# Patient Record
Sex: Female | Born: 1960 | Race: White | Hispanic: No | Smoking: Current some day smoker
Health system: Southern US, Community
[De-identification: ages and names within clinical notes are randomized; demographics above are authoritative.]

## PROBLEM LIST (undated history)

## (undated) DIAGNOSIS — E785 Hyperlipidemia, unspecified: Secondary | ICD-10-CM

## (undated) DIAGNOSIS — M1990 Unspecified osteoarthritis, unspecified site: Secondary | ICD-10-CM

## (undated) DIAGNOSIS — N879 Dysplasia of cervix uteri, unspecified: Secondary | ICD-10-CM

## (undated) DIAGNOSIS — F419 Anxiety disorder, unspecified: Secondary | ICD-10-CM

## (undated) DIAGNOSIS — D414 Neoplasm of uncertain behavior of bladder: Secondary | ICD-10-CM

## (undated) DIAGNOSIS — J449 Chronic obstructive pulmonary disease, unspecified: Secondary | ICD-10-CM

## (undated) DIAGNOSIS — J45909 Unspecified asthma, uncomplicated: Secondary | ICD-10-CM

## (undated) HISTORY — DX: Anxiety disorder, unspecified: F41.9

## (undated) HISTORY — DX: Unspecified osteoarthritis, unspecified site: M19.90

## (undated) HISTORY — DX: Dysplasia of cervix uteri, unspecified: N87.9

## (undated) HISTORY — DX: Hyperlipidemia, unspecified: E78.5

## (undated) HISTORY — PX: ABDOMINAL HYSTERECTOMY: SHX81

## (undated) HISTORY — DX: Chronic obstructive pulmonary disease, unspecified: J44.9

## (undated) HISTORY — DX: Neoplasm of uncertain behavior of bladder: D41.4

## (undated) HISTORY — DX: Unspecified asthma, uncomplicated: J45.909

## (undated) HISTORY — PX: CHOLECYSTECTOMY: SHX55

## (undated) HISTORY — PX: TONSILLECTOMY: SHX5217

---

## 1996-09-16 HISTORY — PX: TOTAL COLECTOMY: SHX852

## 2003-03-22 ENCOUNTER — Ambulatory Visit (HOSPITAL_COMMUNITY): Admission: RE | Admit: 2003-03-22 | Discharge: 2003-03-22 | Payer: Self-pay | Admitting: Internal Medicine

## 2005-06-14 ENCOUNTER — Ambulatory Visit: Payer: Self-pay | Admitting: Cardiology

## 2010-01-29 ENCOUNTER — Emergency Department (HOSPITAL_COMMUNITY): Admission: EM | Admit: 2010-01-29 | Discharge: 2010-01-29 | Payer: Self-pay | Admitting: Emergency Medicine

## 2011-02-01 NOTE — Op Note (Signed)
NAME:  Tamara Castro, Tamara Castro                     ACCOUNT NO.:  [id number]   MEDICAL RECORD NO.:  [id number]                   PATIENT TYPE:  AMB   LOCATION:  DAY                                  FACILITY:  [facility]   PHYSICIAN:  Lionel December, M.D.                 DATE OF BIRTH:  1961/03/16   DATE OF PROCEDURE:  03/23/2003  DATE OF DISCHARGE:  03/22/2003                                 OPERATIVE REPORT   PROCEDURE:  Ambulatory esophageal pH report.   REFERRING PHYSICIAN:  Erskine Speed, M.D.   INDICATIONS:  Gastroesophageal reflux disease.   PRIOR STUDIES:  1. March 14, 2003 EGD by Dr. Erskine Speed revealed a sliding hiatal hernia.  2. October 1999 an esophageal manometry was abnormal. There was high distal     esophageal body amplitude of contraction with prolonged duration. There     was normal peristalsis. Resting lower esophageal sphincter pressure was     on the high end but adequately relaxed. 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   METHOD:  The pH electrodes were placed 20 and 5 cm above the proximal border  of the manometrically determined lower esophageal sphincter (LES). These  electrodes were defined as channels 1 and 2, respectively. The data was  converted using the [employer], version 2.30. A reflux  episode was defined as a drop in pH below 4.0. The procedure was reviewed  with the patient prior to performing it.   FINDINGS:  Proximal border of the LES (location from nares):  46 cm  (obtained from 1999 esophageal manometry)  Study duration:  24 hours  Channel 2 analysis:  Total time pH below 4.0 (min): Number of reflux episodes 13, 10 post  prandially. Number of reflux episodes longer than 5 minutes were 0. Longest  reflux episode 1 minute.  Fraction of total time pH below 4.0:  9 minutes, fraction time pH below 4,  0.6%.  DeMeester score (DeMeester normals <14.7 95th percentile): 2.7   IMPRESSION:  24 hour pH probe  study performed off of acid depression. The  patient discontinued Aciphex approximately 10 days prior to pH study. During  this 24 hour study, there was limited acid reflux episodes. Well within the  normal physiological range. The patient recorded no symptoms during this 24  hour time period. I did call the patient and she stated she had a remarkably  good day during the study, without her typical GERD symptoms.   She has a history of nut cracker's esophagus __________. Clinically is not  felt to have symptoms related to this. This will not be a contraindication  to antireflux surgery.   The above findings have been discussed with the patient.   RECOMMENDATION:  The patient is to followup with Dr. Erskine Speed.     Tana Coast, P.A.  Lionel December, M.D.    LL/MEDQ  D:  03/25/2003  T:  03/26/2003  Job:  [id number]   cc:   Erskine Speed, M.D.  [address] [place]  [place]  [place] [address]  Fax: [phone]

## 2015-01-09 DIAGNOSIS — M5430 Sciatica, unspecified side: Secondary | ICD-10-CM | POA: Diagnosis not present

## 2015-01-09 DIAGNOSIS — M545 Low back pain: Secondary | ICD-10-CM | POA: Diagnosis not present

## 2015-01-16 DIAGNOSIS — G2581 Restless legs syndrome: Secondary | ICD-10-CM | POA: Diagnosis not present

## 2015-01-16 DIAGNOSIS — F419 Anxiety disorder, unspecified: Secondary | ICD-10-CM | POA: Diagnosis not present

## 2015-01-16 DIAGNOSIS — Z720 Tobacco use: Secondary | ICD-10-CM | POA: Diagnosis not present

## 2015-01-25 DIAGNOSIS — M5442 Lumbago with sciatica, left side: Secondary | ICD-10-CM | POA: Diagnosis not present

## 2015-04-20 DIAGNOSIS — Z720 Tobacco use: Secondary | ICD-10-CM | POA: Diagnosis not present

## 2015-04-20 DIAGNOSIS — M5430 Sciatica, unspecified side: Secondary | ICD-10-CM | POA: Diagnosis not present

## 2015-04-20 DIAGNOSIS — F419 Anxiety disorder, unspecified: Secondary | ICD-10-CM | POA: Diagnosis not present

## 2015-04-20 DIAGNOSIS — M545 Low back pain: Secondary | ICD-10-CM | POA: Diagnosis not present

## 2015-04-20 DIAGNOSIS — Z1389 Encounter for screening for other disorder: Secondary | ICD-10-CM | POA: Diagnosis not present

## 2015-04-20 DIAGNOSIS — G2581 Restless legs syndrome: Secondary | ICD-10-CM | POA: Diagnosis not present

## 2015-04-25 DIAGNOSIS — Z1231 Encounter for screening mammogram for malignant neoplasm of breast: Secondary | ICD-10-CM | POA: Diagnosis not present

## 2015-09-28 DIAGNOSIS — Z0001 Encounter for general adult medical examination with abnormal findings: Secondary | ICD-10-CM | POA: Diagnosis not present

## 2015-09-28 DIAGNOSIS — G2581 Restless legs syndrome: Secondary | ICD-10-CM | POA: Diagnosis not present

## 2015-09-28 DIAGNOSIS — M545 Low back pain: Secondary | ICD-10-CM | POA: Diagnosis not present

## 2015-09-28 DIAGNOSIS — F0631 Mood disorder due to known physiological condition with depressive features: Secondary | ICD-10-CM | POA: Diagnosis not present

## 2015-09-28 DIAGNOSIS — F419 Anxiety disorder, unspecified: Secondary | ICD-10-CM | POA: Diagnosis not present

## 2015-09-28 DIAGNOSIS — Z23 Encounter for immunization: Secondary | ICD-10-CM | POA: Diagnosis not present

## 2015-09-28 DIAGNOSIS — Z720 Tobacco use: Secondary | ICD-10-CM | POA: Diagnosis not present

## 2016-03-13 DIAGNOSIS — M545 Low back pain: Secondary | ICD-10-CM | POA: Diagnosis not present

## 2016-03-13 DIAGNOSIS — M5432 Sciatica, left side: Secondary | ICD-10-CM | POA: Diagnosis not present

## 2016-04-04 DIAGNOSIS — G2581 Restless legs syndrome: Secondary | ICD-10-CM | POA: Diagnosis not present

## 2016-04-04 DIAGNOSIS — M545 Low back pain: Secondary | ICD-10-CM | POA: Diagnosis not present

## 2016-04-04 DIAGNOSIS — Z720 Tobacco use: Secondary | ICD-10-CM | POA: Diagnosis not present

## 2016-04-09 DIAGNOSIS — M5126 Other intervertebral disc displacement, lumbar region: Secondary | ICD-10-CM | POA: Diagnosis not present

## 2016-04-09 DIAGNOSIS — M5432 Sciatica, left side: Secondary | ICD-10-CM | POA: Diagnosis not present

## 2016-04-09 DIAGNOSIS — M5127 Other intervertebral disc displacement, lumbosacral region: Secondary | ICD-10-CM | POA: Diagnosis not present

## 2016-04-09 DIAGNOSIS — M4806 Spinal stenosis, lumbar region: Secondary | ICD-10-CM | POA: Diagnosis not present

## 2016-07-01 DIAGNOSIS — Z1231 Encounter for screening mammogram for malignant neoplasm of breast: Secondary | ICD-10-CM | POA: Diagnosis not present

## 2017-02-03 DIAGNOSIS — G4700 Insomnia, unspecified: Secondary | ICD-10-CM | POA: Diagnosis not present

## 2017-03-31 DIAGNOSIS — G2581 Restless legs syndrome: Secondary | ICD-10-CM | POA: Diagnosis not present

## 2017-03-31 DIAGNOSIS — Z1389 Encounter for screening for other disorder: Secondary | ICD-10-CM | POA: Diagnosis not present

## 2017-03-31 DIAGNOSIS — Z720 Tobacco use: Secondary | ICD-10-CM | POA: Diagnosis not present

## 2017-03-31 DIAGNOSIS — M5432 Sciatica, left side: Secondary | ICD-10-CM | POA: Diagnosis not present

## 2017-03-31 DIAGNOSIS — M545 Low back pain: Secondary | ICD-10-CM | POA: Diagnosis not present

## 2017-04-16 DIAGNOSIS — R109 Unspecified abdominal pain: Secondary | ICD-10-CM | POA: Diagnosis not present

## 2017-04-16 DIAGNOSIS — J439 Emphysema, unspecified: Secondary | ICD-10-CM | POA: Diagnosis not present

## 2017-04-16 DIAGNOSIS — R102 Pelvic and perineal pain: Secondary | ICD-10-CM | POA: Diagnosis not present

## 2017-04-16 DIAGNOSIS — R911 Solitary pulmonary nodule: Secondary | ICD-10-CM | POA: Diagnosis not present

## 2017-04-16 DIAGNOSIS — R079 Chest pain, unspecified: Secondary | ICD-10-CM | POA: Diagnosis not present

## 2017-04-16 DIAGNOSIS — I700 Atherosclerosis of aorta: Secondary | ICD-10-CM | POA: Diagnosis not present

## 2017-05-02 DIAGNOSIS — N3091 Cystitis, unspecified with hematuria: Secondary | ICD-10-CM | POA: Diagnosis not present

## 2017-05-02 DIAGNOSIS — Z6823 Body mass index (BMI) 23.0-23.9, adult: Secondary | ICD-10-CM | POA: Diagnosis not present

## 2017-05-02 DIAGNOSIS — M797 Fibromyalgia: Secondary | ICD-10-CM | POA: Diagnosis not present

## 2017-09-30 DIAGNOSIS — M797 Fibromyalgia: Secondary | ICD-10-CM | POA: Diagnosis not present

## 2017-09-30 DIAGNOSIS — Z720 Tobacco use: Secondary | ICD-10-CM | POA: Diagnosis not present

## 2017-09-30 DIAGNOSIS — G2581 Restless legs syndrome: Secondary | ICD-10-CM | POA: Diagnosis not present

## 2017-09-30 DIAGNOSIS — Z0001 Encounter for general adult medical examination with abnormal findings: Secondary | ICD-10-CM | POA: Diagnosis not present

## 2018-03-17 DIAGNOSIS — Z7689 Persons encountering health services in other specified circumstances: Secondary | ICD-10-CM | POA: Diagnosis not present

## 2018-03-17 DIAGNOSIS — G2581 Restless legs syndrome: Secondary | ICD-10-CM | POA: Diagnosis not present

## 2018-03-17 DIAGNOSIS — G4700 Insomnia, unspecified: Secondary | ICD-10-CM | POA: Diagnosis not present

## 2018-03-17 DIAGNOSIS — Z6822 Body mass index (BMI) 22.0-22.9, adult: Secondary | ICD-10-CM | POA: Diagnosis not present

## 2018-03-17 DIAGNOSIS — Z720 Tobacco use: Secondary | ICD-10-CM | POA: Diagnosis not present

## 2018-03-17 DIAGNOSIS — M797 Fibromyalgia: Secondary | ICD-10-CM | POA: Diagnosis not present

## 2018-04-14 DIAGNOSIS — M545 Low back pain: Secondary | ICD-10-CM | POA: Diagnosis not present

## 2018-04-14 DIAGNOSIS — Z720 Tobacco use: Secondary | ICD-10-CM | POA: Diagnosis not present

## 2018-04-14 DIAGNOSIS — Z1389 Encounter for screening for other disorder: Secondary | ICD-10-CM | POA: Diagnosis not present

## 2018-04-14 DIAGNOSIS — M5432 Sciatica, left side: Secondary | ICD-10-CM | POA: Diagnosis not present

## 2018-04-14 DIAGNOSIS — M797 Fibromyalgia: Secondary | ICD-10-CM | POA: Diagnosis not present

## 2018-04-14 DIAGNOSIS — G2581 Restless legs syndrome: Secondary | ICD-10-CM | POA: Diagnosis not present

## 2018-06-02 DIAGNOSIS — R918 Other nonspecific abnormal finding of lung field: Secondary | ICD-10-CM | POA: Diagnosis not present

## 2018-06-02 DIAGNOSIS — J439 Emphysema, unspecified: Secondary | ICD-10-CM | POA: Diagnosis not present

## 2018-06-02 DIAGNOSIS — R0600 Dyspnea, unspecified: Secondary | ICD-10-CM | POA: Diagnosis not present

## 2018-06-02 DIAGNOSIS — R05 Cough: Secondary | ICD-10-CM | POA: Diagnosis not present

## 2018-06-02 DIAGNOSIS — I700 Atherosclerosis of aorta: Secondary | ICD-10-CM | POA: Diagnosis not present

## 2018-06-09 DIAGNOSIS — Z720 Tobacco use: Secondary | ICD-10-CM | POA: Diagnosis not present

## 2018-06-09 DIAGNOSIS — M797 Fibromyalgia: Secondary | ICD-10-CM | POA: Diagnosis not present

## 2018-06-09 DIAGNOSIS — G4700 Insomnia, unspecified: Secondary | ICD-10-CM | POA: Diagnosis not present

## 2018-06-09 DIAGNOSIS — Z6822 Body mass index (BMI) 22.0-22.9, adult: Secondary | ICD-10-CM | POA: Diagnosis not present

## 2018-07-16 DIAGNOSIS — G4700 Insomnia, unspecified: Secondary | ICD-10-CM | POA: Diagnosis not present

## 2018-07-16 DIAGNOSIS — J441 Chronic obstructive pulmonary disease with (acute) exacerbation: Secondary | ICD-10-CM | POA: Diagnosis not present

## 2018-07-16 DIAGNOSIS — Z720 Tobacco use: Secondary | ICD-10-CM | POA: Diagnosis not present

## 2018-07-16 DIAGNOSIS — Z6822 Body mass index (BMI) 22.0-22.9, adult: Secondary | ICD-10-CM | POA: Diagnosis not present

## 2018-07-16 DIAGNOSIS — M797 Fibromyalgia: Secondary | ICD-10-CM | POA: Diagnosis not present

## 2018-09-18 DIAGNOSIS — G4700 Insomnia, unspecified: Secondary | ICD-10-CM | POA: Diagnosis not present

## 2018-09-18 DIAGNOSIS — M797 Fibromyalgia: Secondary | ICD-10-CM | POA: Diagnosis not present

## 2018-09-18 DIAGNOSIS — Z6822 Body mass index (BMI) 22.0-22.9, adult: Secondary | ICD-10-CM | POA: Diagnosis not present

## 2018-09-18 DIAGNOSIS — J441 Chronic obstructive pulmonary disease with (acute) exacerbation: Secondary | ICD-10-CM | POA: Diagnosis not present

## 2019-03-11 DIAGNOSIS — R5383 Other fatigue: Secondary | ICD-10-CM | POA: Diagnosis not present

## 2019-03-11 DIAGNOSIS — J441 Chronic obstructive pulmonary disease with (acute) exacerbation: Secondary | ICD-10-CM | POA: Diagnosis not present

## 2019-03-16 DIAGNOSIS — M797 Fibromyalgia: Secondary | ICD-10-CM | POA: Diagnosis not present

## 2019-03-16 DIAGNOSIS — M545 Low back pain: Secondary | ICD-10-CM | POA: Diagnosis not present

## 2019-03-16 DIAGNOSIS — J45902 Unspecified asthma with status asthmaticus: Secondary | ICD-10-CM | POA: Diagnosis not present

## 2019-03-16 DIAGNOSIS — G4700 Insomnia, unspecified: Secondary | ICD-10-CM | POA: Diagnosis not present

## 2019-03-16 DIAGNOSIS — Z6824 Body mass index (BMI) 24.0-24.9, adult: Secondary | ICD-10-CM | POA: Diagnosis not present

## 2019-05-20 DIAGNOSIS — M797 Fibromyalgia: Secondary | ICD-10-CM | POA: Diagnosis not present

## 2019-05-20 DIAGNOSIS — J441 Chronic obstructive pulmonary disease with (acute) exacerbation: Secondary | ICD-10-CM | POA: Diagnosis not present

## 2019-05-20 DIAGNOSIS — G4700 Insomnia, unspecified: Secondary | ICD-10-CM | POA: Diagnosis not present

## 2019-07-16 DIAGNOSIS — J441 Chronic obstructive pulmonary disease with (acute) exacerbation: Secondary | ICD-10-CM | POA: Diagnosis not present

## 2019-08-16 DIAGNOSIS — J441 Chronic obstructive pulmonary disease with (acute) exacerbation: Secondary | ICD-10-CM | POA: Diagnosis not present

## 2019-08-16 DIAGNOSIS — M797 Fibromyalgia: Secondary | ICD-10-CM | POA: Diagnosis not present

## 2019-09-13 DIAGNOSIS — J441 Chronic obstructive pulmonary disease with (acute) exacerbation: Secondary | ICD-10-CM | POA: Diagnosis not present

## 2019-09-13 DIAGNOSIS — G4700 Insomnia, unspecified: Secondary | ICD-10-CM | POA: Diagnosis not present

## 2019-09-13 DIAGNOSIS — M797 Fibromyalgia: Secondary | ICD-10-CM | POA: Diagnosis not present

## 2019-09-16 DIAGNOSIS — J441 Chronic obstructive pulmonary disease with (acute) exacerbation: Secondary | ICD-10-CM | POA: Diagnosis not present

## 2019-09-16 DIAGNOSIS — M797 Fibromyalgia: Secondary | ICD-10-CM | POA: Diagnosis not present

## 2019-10-26 DIAGNOSIS — R55 Syncope and collapse: Secondary | ICD-10-CM | POA: Diagnosis not present

## 2019-10-26 DIAGNOSIS — R4020 Unspecified coma: Secondary | ICD-10-CM | POA: Diagnosis not present

## 2019-10-26 DIAGNOSIS — I959 Hypotension, unspecified: Secondary | ICD-10-CM | POA: Diagnosis not present

## 2019-11-12 DIAGNOSIS — I10 Essential (primary) hypertension: Secondary | ICD-10-CM | POA: Diagnosis not present

## 2019-11-12 DIAGNOSIS — E7849 Other hyperlipidemia: Secondary | ICD-10-CM | POA: Diagnosis not present

## 2020-02-14 DIAGNOSIS — Z720 Tobacco use: Secondary | ICD-10-CM | POA: Diagnosis not present

## 2020-02-14 DIAGNOSIS — J441 Chronic obstructive pulmonary disease with (acute) exacerbation: Secondary | ICD-10-CM | POA: Diagnosis not present

## 2020-02-14 DIAGNOSIS — J45902 Unspecified asthma with status asthmaticus: Secondary | ICD-10-CM | POA: Diagnosis not present

## 2020-02-14 DIAGNOSIS — M797 Fibromyalgia: Secondary | ICD-10-CM | POA: Diagnosis not present

## 2020-03-10 DIAGNOSIS — Z720 Tobacco use: Secondary | ICD-10-CM | POA: Diagnosis not present

## 2020-03-10 DIAGNOSIS — E039 Hypothyroidism, unspecified: Secondary | ICD-10-CM | POA: Diagnosis not present

## 2020-03-10 DIAGNOSIS — R5383 Other fatigue: Secondary | ICD-10-CM | POA: Diagnosis not present

## 2020-03-10 DIAGNOSIS — G2581 Restless legs syndrome: Secondary | ICD-10-CM | POA: Diagnosis not present

## 2020-03-10 DIAGNOSIS — J441 Chronic obstructive pulmonary disease with (acute) exacerbation: Secondary | ICD-10-CM | POA: Diagnosis not present

## 2020-03-10 DIAGNOSIS — M797 Fibromyalgia: Secondary | ICD-10-CM | POA: Diagnosis not present

## 2020-03-14 DIAGNOSIS — Z720 Tobacco use: Secondary | ICD-10-CM | POA: Diagnosis not present

## 2020-03-14 DIAGNOSIS — Z6824 Body mass index (BMI) 24.0-24.9, adult: Secondary | ICD-10-CM | POA: Diagnosis not present

## 2020-03-14 DIAGNOSIS — Z23 Encounter for immunization: Secondary | ICD-10-CM | POA: Diagnosis not present

## 2020-03-14 DIAGNOSIS — Z0001 Encounter for general adult medical examination with abnormal findings: Secondary | ICD-10-CM | POA: Diagnosis not present

## 2020-03-15 DIAGNOSIS — J441 Chronic obstructive pulmonary disease with (acute) exacerbation: Secondary | ICD-10-CM | POA: Diagnosis not present

## 2020-03-15 DIAGNOSIS — M797 Fibromyalgia: Secondary | ICD-10-CM | POA: Diagnosis not present

## 2020-03-15 DIAGNOSIS — Z720 Tobacco use: Secondary | ICD-10-CM | POA: Diagnosis not present

## 2020-03-15 DIAGNOSIS — J45902 Unspecified asthma with status asthmaticus: Secondary | ICD-10-CM | POA: Diagnosis not present

## 2020-07-15 DIAGNOSIS — J441 Chronic obstructive pulmonary disease with (acute) exacerbation: Secondary | ICD-10-CM | POA: Diagnosis not present

## 2020-07-15 DIAGNOSIS — J45902 Unspecified asthma with status asthmaticus: Secondary | ICD-10-CM | POA: Diagnosis not present

## 2020-07-15 DIAGNOSIS — M797 Fibromyalgia: Secondary | ICD-10-CM | POA: Diagnosis not present

## 2020-07-15 DIAGNOSIS — Z720 Tobacco use: Secondary | ICD-10-CM | POA: Diagnosis not present

## 2020-09-04 DIAGNOSIS — Z833 Family history of diabetes mellitus: Secondary | ICD-10-CM | POA: Diagnosis not present

## 2020-09-04 DIAGNOSIS — J441 Chronic obstructive pulmonary disease with (acute) exacerbation: Secondary | ICD-10-CM | POA: Diagnosis not present

## 2020-09-04 DIAGNOSIS — Z131 Encounter for screening for diabetes mellitus: Secondary | ICD-10-CM | POA: Diagnosis not present

## 2020-09-04 DIAGNOSIS — Z1329 Encounter for screening for other suspected endocrine disorder: Secondary | ICD-10-CM | POA: Diagnosis not present

## 2020-09-04 DIAGNOSIS — R5383 Other fatigue: Secondary | ICD-10-CM | POA: Diagnosis not present

## 2020-09-07 DIAGNOSIS — G4700 Insomnia, unspecified: Secondary | ICD-10-CM | POA: Diagnosis not present

## 2020-09-07 DIAGNOSIS — M797 Fibromyalgia: Secondary | ICD-10-CM | POA: Diagnosis not present

## 2020-09-07 DIAGNOSIS — J441 Chronic obstructive pulmonary disease with (acute) exacerbation: Secondary | ICD-10-CM | POA: Diagnosis not present

## 2021-04-02 ENCOUNTER — Encounter: Payer: Self-pay | Admitting: *Deleted

## 2021-04-02 DIAGNOSIS — R918 Other nonspecific abnormal finding of lung field: Secondary | ICD-10-CM

## 2021-04-02 NOTE — Progress Notes (Signed)
I received referral on [name].  I updated new patient coordinator to call and schedule her to be seen on 7/25 with Cassie.

## 2021-04-03 ENCOUNTER — Encounter: Payer: Self-pay | Admitting: *Deleted

## 2021-04-03 NOTE — Progress Notes (Signed)
I received a message from new patient coordinator that patient will be seen at [facility].

## 2021-04-09 ENCOUNTER — Other Ambulatory Visit: Payer: Self-pay

## 2021-04-09 ENCOUNTER — Encounter (HOSPITAL_COMMUNITY): Payer: Self-pay | Admitting: Surgery

## 2021-04-10 ENCOUNTER — Encounter (HOSPITAL_COMMUNITY): Payer: Self-pay | Admitting: Hematology and Oncology

## 2021-04-10 ENCOUNTER — Inpatient Hospital Stay (HOSPITAL_COMMUNITY): Payer: Medicare Other | Attending: Hematology and Oncology | Admitting: Hematology and Oncology

## 2021-04-10 VITALS — BP 121/74 | HR 101 | Temp 96.9°F | Resp 20 | Ht 65.5 in | Wt 157.9 lb

## 2021-04-10 DIAGNOSIS — J449 Chronic obstructive pulmonary disease, unspecified: Secondary | ICD-10-CM | POA: Insufficient documentation

## 2021-04-10 DIAGNOSIS — E785 Hyperlipidemia, unspecified: Secondary | ICD-10-CM | POA: Diagnosis not present

## 2021-04-10 DIAGNOSIS — R918 Other nonspecific abnormal finding of lung field: Secondary | ICD-10-CM | POA: Diagnosis not present

## 2021-04-10 DIAGNOSIS — M1990 Unspecified osteoarthritis, unspecified site: Secondary | ICD-10-CM | POA: Insufficient documentation

## 2021-04-10 DIAGNOSIS — R058 Other specified cough: Secondary | ICD-10-CM | POA: Insufficient documentation

## 2021-04-10 DIAGNOSIS — F17210 Nicotine dependence, cigarettes, uncomplicated: Secondary | ICD-10-CM | POA: Diagnosis not present

## 2021-04-10 NOTE — Progress Notes (Signed)
[place] [phone]   [phone]  INITIAL CONSULT NOTE  Patient Care Team: Practice, [facility] as PCP - General  Hematological/Oncological History # Pulmonary Nodules  03/26/2021: CT Chest WO contrast. Noted to have numerous spiculated pulmonary nodules throughout the chest. Reported to have >20 04/10/2021: establish care with Dr. Lorenso Courier   CHIEF COMPLAINTS/PURPOSE OF CONSULTATION:  "Pulmonary Nodules  "  HISTORY OF PRESENTING ILLNESS:  Tamara Castro 60 y.o. female with medical history significant for COPD, HLD, and OA who presents for evaluation of newly diagnosed lung nodules.   On review of the previous records Tamara Castro recently had a CT scan of the chest without contrast on 03/26/2021.  At that time she was noted to have numerous spiculated pulmonary nodules throughout the chest.  There were no to be greater than 20 in number.  Due to concern for this finding the patient was referred to oncology for further evaluation and management.  On exam today Tamara Castro is accompanied by her daughter.  She reports that she has a longstanding history of shortness of breath and is currently at her baseline level.  There has been some slight worsening of the shortness of breath over the last year.  She notes that she does sometimes have a cough productive for whitish sputum.  She notes that her weight has been steady.  She is not currently having any chest pain.  She does have low energy but she thought this was secondary to her depression.  On further discussion she has had multiple surgeries including a gallbladder removal in 1994, hysterectomy and polyps in the urethra removed in 1997 and a large portion of her colon removed due to diverticulitis in 1998.  She notes that her family history is remarkable for lung cancer in her father and breast cancer in her mother.  She does not have any personal history of cancer.  She is a smoker but has  been trying to cut back.  She notes that she chewed nicotine gum this morning but just made her sick to her stomach.  She does not drink any alcohol.  She otherwise denies any fevers, chills, sweats, nausea, vomiting or diarrhea.  A full 10 point ROS is listed below.  MEDICAL HISTORY:  Past Medical History:  Diagnosis Date   Anxiety    Asthma    Bladder polyps    per pt, urethral polyps   COPD (chronic obstructive pulmonary disease) (HCC)    Dysplasia of cervix    Hyperlipidemia    Osteoarthritis     SURGICAL HISTORY: Past Surgical History:  Procedure Laterality Date   ABDOMINAL HYSTERECTOMY     CHOLECYSTECTOMY     TONSILLECTOMY Bilateral    TOTAL COLECTOMY  1998    SOCIAL HISTORY: Social History   Socioeconomic History   Marital status: Divorced    Spouse name: Not on file   Number of children: 2   Years of education: Not on file   Highest education level: Not on file  Occupational History   Occupation: Disability since 2013  Tobacco Use   Smoking status: Some Days    Packs/day: 1.00    Years: 40.00    Pack years: 40.00    Types: Cigarettes   Smokeless tobacco: Never   Tobacco comments:    Pt is down to smoking 6 cigarettes a day  Vaping Use   Vaping Use: Former  Substance and Sexual Activity   Alcohol use: Not Currently   Drug  use: Not Currently    Comment: hx of smoking marijuana   Sexual activity: Not Currently  Other Topics Concern   Not on file  Social History Narrative   Not on file   Social Determinants of Health   Financial Resource Strain: Low Risk    Difficulty of Paying Living Expenses: Not hard at all  Food Insecurity: No Food Insecurity   Worried About Charity fundraiser in the Last Year: Never true   [place] in the Last Year: Never true  Transportation Needs: No Transportation Needs   Lack of Transportation (Medical): No   Lack of Transportation (Non-Medical): No  Physical Activity: Insufficiently Active   Days of Exercise per  Week: 3 days   Minutes of Exercise per Session: 20 min  Stress: No Stress Concern Present   Feeling of Stress : Only a little  Social Connections: Socially Isolated   Frequency of Communication with Friends and Family: More than three times a week   Frequency of Social Gatherings with Friends and Family: Three times a week   Attends Religious Services: Never   Active Member of Clubs or Organizations: No   Attends Archivist Meetings: Never   Marital Status: Divorced  Human resources officer Violence: Not At Risk   Fear of Current or Ex-Partner: No   Emotionally Abused: No   Physically Abused: No   Sexually Abused: No    FAMILY HISTORY: History reviewed. No pertinent family history.  ALLERGIES:  is allergic to ciprofloxacin.  MEDICATIONS:  Current Outpatient Medications  Medication Sig Dispense Refill   ALPRAZolam (XANAX) 1 MG tablet Take 1 mg by mouth 4 (four) times daily.     ANORO ELLIPTA 62.5-25 MCG/INH AEPB Inhale 1 puff into the lungs daily.     cefUROXime (CEFTIN) 500 MG tablet Take 500 mg by mouth 2 (two) times daily.     diphenhydrAMINE HCl, Sleep, (ZZZQUIL PO) Take 2 tablets by mouth at bedtime. Pt takes 2 gummies every night     escitalopram (LEXAPRO) 10 MG tablet Take 10 mg by mouth 2 (two) times daily.     ezetimibe (ZETIA) 10 MG tablet SMARTSIG:1 Tablet(s) By Mouth Every Evening     PROAIR HFA 108 (90 Base) MCG/ACT inhaler Inhale 2 puffs into the lungs 4 (four) times daily.     gabapentin (NEURONTIN) 400 MG capsule Take 800 mg by mouth 3 (three) times daily as needed. (Patient not taking: Reported on 04/10/2021)     No current facility-administered medications for this visit.    REVIEW OF SYSTEMS:   Constitutional: ( - ) fevers, ( - )  chills , ( - ) night sweats Eyes: ( - ) blurriness of vision, ( - ) double vision, ( - ) watery eyes Ears, nose, mouth, throat, and face: ( - ) mucositis, ( - ) sore throat Respiratory: ( - ) cough, ( - ) dyspnea, ( - )  wheezes Cardiovascular: ( - ) palpitation, ( - ) chest discomfort, ( - ) lower extremity swelling Gastrointestinal:  ( - ) nausea, ( - ) heartburn, ( - ) change in bowel habits Skin: ( - ) abnormal skin rashes Lymphatics: ( - ) new lymphadenopathy, ( - ) easy bruising Neurological: ( - ) numbness, ( - ) tingling, ( - ) new weaknesses Behavioral/Psych: ( - ) mood change, ( - ) new changes  All other systems were reviewed with the patient and are negative.  PHYSICAL EXAMINATION: ECOG PERFORMANCE STATUS: 1 -  Symptomatic but completely ambulatory  Vitals:   04/10/21 0956  BP: 121/74  Pulse: (!) 101  Resp: 20  Temp: (!) 96.9 F (36.1 C)  SpO2: 95%   Filed Weights   04/10/21 0956  Weight: 157 lb 14.4 oz (71.6 kg)    GENERAL: well appearing middle-aged Caucasian female in NAD  SKIN: skin color, texture, turgor are normal, no rashes or significant lesions EYES: conjunctiva are pink and non-injected, sclera clear LUNGS: clear to auscultation and percussion with normal breathing effort HEART: regular rate & rhythm and no murmurs and no lower extremity edema PSYCH: alert & oriented x 3, fluent speech NEURO: no focal motor/sensory deficits  LABORATORY DATA:  I have reviewed the data as listed No flowsheet data found.  No flowsheet data found.  RADIOGRAPHIC STUDIES: No results found.  ASSESSMENT & PLAN Tamara Castro 60 y.o. female with medical history significant for COPD, HLD, and OA who presents for evaluation of newly diagnosed lung nodules.   After review of the labs, review of the records, and discussion with the patient the patients findings are most consistent with lung nodules of unclear etiology.  These are highly suspicious for malignancy and therefore we will order a PET CT scan in order to evaluate the FDG avidity.  Once this is obtained if there is clear FDG avidity we will order a biopsy to be performed either by bronchoscopy or there is an easier site to reach  within the abdomen we can proceed with an IR guided biopsy.  We will plan to have the patient return to clinic after the PET and biopsy are performed.  Placeholder appointment scheduled for 4 weeks time.  #Lung Nodules  -- We will order a PET CT scan to determine if these lung nodules are FDG avid. --Once the PET/CT is attained can consider biopsy of one of the easiest sites to reach if there is concern for malignancy.  There was noted to be abnormalities of the liver on CT scan which may provide an easy biopsy site. --Return to clinic pending the results of the PET CT scan and biopsy.  Placeholder appointment scheduled for 4 weeks out  Orders Placed This Encounter  Procedures   NM PET Image Initial (PI) Skull Base To Thigh    Standing Status:   Future    Standing Expiration Date:   04/10/2022    Order Specific Question:   If indicated for the ordered procedure, I authorize the administration of a radiopharmaceutical per Radiology protocol    Answer:   Yes    Order Specific Question:   Is the patient pregnant?    Answer:   No    Order Specific Question:   Preferred imaging location?    Answer:   [facility]   CBC with Differential    Standing Status:   Future    Standing Expiration Date:   04/10/2022   Comprehensive metabolic panel    Standing Status:   Future    Standing Expiration Date:   04/10/2022    All questions were answered. The patient knows to call the clinic with any problems, questions or concerns.  A total of more than 60 minutes were spent on this encounter with face-to-face time and non-face-to-face time, including preparing to see the patient, ordering tests and/or medications, counseling the patient and coordination of care as outlined above.   Ledell Peoples, MD Department of Hematology/Oncology [place] at [facility] Phone: [phone] Pager: [phone] Email:  [email or web address]  04/10/2021 4:39 PM

## 2021-05-07 ENCOUNTER — Other Ambulatory Visit: Payer: Self-pay

## 2021-05-07 ENCOUNTER — Inpatient Hospital Stay (HOSPITAL_COMMUNITY): Payer: Medicare Other | Attending: Hematology

## 2021-05-07 DIAGNOSIS — M1990 Unspecified osteoarthritis, unspecified site: Secondary | ICD-10-CM | POA: Insufficient documentation

## 2021-05-07 DIAGNOSIS — R911 Solitary pulmonary nodule: Secondary | ICD-10-CM | POA: Diagnosis not present

## 2021-05-07 DIAGNOSIS — J432 Centrilobular emphysema: Secondary | ICD-10-CM | POA: Diagnosis not present

## 2021-05-07 DIAGNOSIS — F17210 Nicotine dependence, cigarettes, uncomplicated: Secondary | ICD-10-CM | POA: Diagnosis not present

## 2021-05-07 DIAGNOSIS — J449 Chronic obstructive pulmonary disease, unspecified: Secondary | ICD-10-CM | POA: Insufficient documentation

## 2021-05-07 DIAGNOSIS — Z803 Family history of malignant neoplasm of breast: Secondary | ICD-10-CM | POA: Diagnosis not present

## 2021-05-07 DIAGNOSIS — J45909 Unspecified asthma, uncomplicated: Secondary | ICD-10-CM | POA: Insufficient documentation

## 2021-05-07 DIAGNOSIS — E785 Hyperlipidemia, unspecified: Secondary | ICD-10-CM | POA: Insufficient documentation

## 2021-05-07 DIAGNOSIS — Z79899 Other long term (current) drug therapy: Secondary | ICD-10-CM | POA: Diagnosis not present

## 2021-05-07 DIAGNOSIS — Z801 Family history of malignant neoplasm of trachea, bronchus and lung: Secondary | ICD-10-CM | POA: Diagnosis not present

## 2021-05-07 DIAGNOSIS — R918 Other nonspecific abnormal finding of lung field: Secondary | ICD-10-CM

## 2021-05-07 LAB — CBC WITH DIFFERENTIAL/PLATELET
Abs Immature Granulocytes: 0.02 10*3/uL (ref 0.00–0.07)
Basophils Absolute: 0.1 10*3/uL (ref 0.0–0.1)
Basophils Relative: 1 %
Eosinophils Absolute: 0.1 10*3/uL (ref 0.0–0.5)
Eosinophils Relative: 1 %
HCT: 40.9 % (ref 36.0–46.0)
Hemoglobin: 13.3 g/dL (ref 12.0–15.0)
Immature Granulocytes: 0 %
Lymphocytes Relative: 28 %
Lymphs Abs: 2 10*3/uL (ref 0.7–4.0)
MCH: 31.1 pg (ref 26.0–34.0)
MCHC: 32.5 g/dL (ref 30.0–36.0)
MCV: 95.8 fL (ref 80.0–100.0)
Monocytes Absolute: 0.6 10*3/uL (ref 0.1–1.0)
Monocytes Relative: 9 %
Neutro Abs: 4.4 10*3/uL (ref 1.7–7.7)
Neutrophils Relative %: 61 %
Platelets: 329 10*3/uL (ref 150–400)
RBC: 4.27 MIL/uL (ref 3.87–5.11)
RDW: 12.7 % (ref 11.5–15.5)
WBC: 7.2 10*3/uL (ref 4.0–10.5)
nRBC: 0 % (ref 0.0–0.2)

## 2021-05-07 LAB — COMPREHENSIVE METABOLIC PANEL
ALT: 12 U/L (ref 0–44)
AST: 17 U/L (ref 15–41)
Albumin: 4.1 g/dL (ref 3.5–5.0)
Alkaline Phosphatase: 79 U/L (ref 38–126)
Anion gap: 6 (ref 5–15)
BUN: 9 mg/dL (ref 6–20)
CO2: 26 mmol/L (ref 22–32)
Calcium: 9 mg/dL (ref 8.9–10.3)
Chloride: 102 mmol/L (ref 98–111)
Creatinine, Ser: 1.03 mg/dL — ABNORMAL HIGH (ref 0.44–1.00)
GFR, Estimated: >60 mL/min (ref >60)
Glucose, Bld: 98 mg/dL (ref 70–99)
Potassium: 4.3 mmol/L (ref 3.5–5.1)
Sodium: 134 mmol/L — ABNORMAL LOW (ref 135–145)
Total Bilirubin: 0.4 mg/dL (ref 0.3–1.2)
Total Protein: 7.2 g/dL (ref 6.5–8.1)

## 2021-05-10 ENCOUNTER — Other Ambulatory Visit: Payer: Self-pay

## 2021-05-10 ENCOUNTER — Ambulatory Visit (HOSPITAL_COMMUNITY)
Admission: RE | Admit: 2021-05-10 | Discharge: 2021-05-10 | Disposition: A | Discharge status: Home / Self Care | Payer: Medicare Other | Source: Ambulatory Visit | Attending: Hematology and Oncology | Admitting: Hematology and Oncology

## 2021-05-10 ENCOUNTER — Other Ambulatory Visit (HOSPITAL_COMMUNITY): Payer: Medicare Other

## 2021-05-10 DIAGNOSIS — R918 Other nonspecific abnormal finding of lung field: Secondary | ICD-10-CM | POA: Diagnosis present

## 2021-05-10 MED ORDER — FLUDEOXYGLUCOSE F - 18 (FDG) INJECTION
8.8530 | Freq: Once | INTRAVENOUS | Status: AC | PRN
  Administered 2021-05-10: 8.853 via INTRAVENOUS

## 2021-05-13 NOTE — Progress Notes (Signed)
Tamara Castro, [place] [address]   CLINIC:  Medical Oncology/Hematology  PCP:  Practice, [facility] / [place] [address] [phone]   REASON FOR VISIT:  Follow-up for pulmonary nodule  PRIOR THERAPY: none  NGS Results: not done  CURRENT THERAPY: under work-up  BRIEF ONCOLOGIC HISTORY:  Oncology History   No history exists.    CANCER STAGING: Cancer Staging No matching staging information was found for the patient.  INTERVAL HISTORY:  [name], a 60 y.o. female, returns for routine follow-up of her lung nodules. Tamara Castro was last seen on 04/10/21 by Dr. Narda Rutherford.   Today she reports feeling well. She denies previous history of cancer, and she had a hysterectomy. She currently smokes and has been smoking 1 ppd for 45 years; she reports that she is currently trying to quit smoking. She denies any knowledge of previous Covid infection. She reports recurrent sinus infections for which she is taking Mucinex XD, and she has a history of COPD for which she is taking Anoro. She was put on a course of antibiotics in January 2022 for a sinus infections.   Her father had lung cancer, her mother has breast cancer; she denies any further family history of cancer. 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    REVIEW OF SYSTEMS:  Review of Systems  Constitutional:  Positive for fatigue (25%). Negative for appetite change.  Respiratory:  Positive for cough and shortness of breath.   Gastrointestinal:  Positive for diarrhea.  Neurological:  Positive for numbness (tingling in Leg and foot).  Psychiatric/Behavioral:  Positive for depression. The patient is nervous/anxious.   All other systems reviewed and are negative.  PAST MEDICAL/SURGICAL HISTORY:  Past Medical History:  Diagnosis Date   Anxiety    Asthma    Bladder polyps    per pt,  urethral polyps   COPD (chronic obstructive pulmonary disease) (HCC)    Dysplasia of cervix    Hyperlipidemia    Osteoarthritis    Past Surgical History:  Procedure Laterality Date   ABDOMINAL HYSTERECTOMY     CHOLECYSTECTOMY     TONSILLECTOMY Bilateral    TOTAL COLECTOMY  1998    SOCIAL HISTORY:  Social History   Socioeconomic History   Marital status: Divorced    Spouse name: Not on file   Number of children: 2   Years of education: Not on file   Highest education level: Not on file  Occupational History   Occupation: Disability since 2013  Tobacco Use   Smoking status: Some Days    Packs/day: 1.00    Years: 40.00    Pack years: 40.00    Types: Cigarettes   Smokeless tobacco: Never   Tobacco comments:    Pt is down to smoking 6 cigarettes a day  Vaping Use   Vaping Use: Former  Substance and Sexual Activity   Alcohol use: Not Currently   Drug use: Not Currently    Comment: hx of smoking marijuana   Sexual activity: Not Currently  Other Topics Concern   Not on file  Social History Narrative   Not on file   Social Determinants of Health   Financial Resource Strain: Low Risk    Difficulty of Paying Living Expenses: Not hard at all  Food Insecurity: No Food Insecurity   Worried About Estate manager/land agent of Food in the Last  Year: Never true   [place] in the Last Year: Never true  Transportation Needs: No Transportation Needs   Lack of Transportation (Medical): No   Lack of Transportation (Non-Medical): No  Physical Activity: Insufficiently Active   Days of Exercise per Week: 3 days   Minutes of Exercise per Session: 20 min  Stress: No Stress Concern Present   Feeling of Stress : Only a little  Social Connections: Socially Isolated   Frequency of Communication with Friends and Family: More than three times a week   Frequency of Social Gatherings with Friends and Family: Three times a week   Attends Religious Services: Never   Active Member of Clubs or  Organizations: No   Attends Archivist Meetings: Never   Marital Status: Divorced  Human resources officer Violence: Not At Risk   Fear of Current or Ex-Partner: No   Emotionally Abused: No   Physically Abused: No   Sexually Abused: No    FAMILY HISTORY:  No family history on file.  CURRENT MEDICATIONS:  Current Outpatient Medications  Medication Sig Dispense Refill   ALPRAZolam (XANAX) 1 MG tablet Take 1 mg by mouth 4 (four) times daily.     ANORO ELLIPTA 62.5-25 MCG/INH AEPB Inhale 1 puff into the lungs daily.     cefUROXime (CEFTIN) 500 MG tablet Take 500 mg by mouth 2 (two) times daily.     diphenhydrAMINE HCl, Sleep, (ZZZQUIL PO) Take 2 tablets by mouth at bedtime. Pt takes 2 gummies every night     escitalopram (LEXAPRO) 10 MG tablet Take 10 mg by mouth 2 (two) times daily.     ezetimibe (ZETIA) 10 MG tablet SMARTSIG:1 Tablet(s) By Mouth Every Evening     gabapentin (NEURONTIN) 400 MG capsule Take 800 mg by mouth 3 (three) times daily as needed. (Patient not taking: Reported on 04/10/2021)     PROAIR HFA 108 (90 Base) MCG/ACT inhaler Inhale 2 puffs into the lungs 4 (four) times daily.     No current facility-administered medications for this visit.    ALLERGIES:  Allergies  Allergen Reactions   Ciprofloxacin Diarrhea    Per pt, this medication gave her c-diff    PHYSICAL EXAM:  Performance status (ECOG): 1 - Symptomatic but completely ambulatory  There were no vitals filed for this visit. Wt Readings from Last 3 Encounters:  04/10/21 157 lb 14.4 oz (71.6 kg)   Physical Exam Vitals reviewed.  Constitutional:      Appearance: Normal appearance.  Cardiovascular:     Rate and Rhythm: Normal rate and regular rhythm.     Pulses: Normal pulses.     Heart sounds: Normal heart sounds.  Pulmonary:     Effort: Pulmonary effort is normal.     Breath sounds: Normal breath sounds.  Neurological:     General: No focal deficit present.     Mental Status: She is alert  and oriented to person, place, and time.  Psychiatric:        Mood and Affect: Mood normal.        Behavior: Behavior normal.     LABORATORY DATA:  I have reviewed the labs as listed.  CBC Latest Ref Rng & Units 05/07/2021  WBC 4.0 - 10.5 K/uL 7.2  Hemoglobin 12.0 - 15.0 g/dL 13.3  Hematocrit 36.0 - 46.0 % 40.9  Platelets 150 - 400 K/uL 329   CMP Latest Ref Rng & Units 05/07/2021  Glucose 70 - 99 mg/dL 98  BUN 6 -  20 mg/dL 9  Creatinine 0.44 - 1.00 mg/dL 1.03(H)  Sodium 135 - 145 mmol/L 134(L)  Potassium 3.5 - 5.1 mmol/L 4.3  Chloride 98 - 111 mmol/L 102  CO2 22 - 32 mmol/L 26  Calcium 8.9 - 10.3 mg/dL 9.0  Total Protein 6.5 - 8.1 g/dL 7.2  Total Bilirubin 0.3 - 1.2 mg/dL 0.4  Alkaline Phos 38 - 126 U/L 79  AST 15 - 41 U/L 17  ALT 0 - 44 U/L 12    DIAGNOSTIC IMAGING:  I have independently reviewed the scans and discussed with the patient. NM PET Image Initial (PI) Skull Base To Thigh  Result Date: 05/11/2021 CLINICAL DATA:  Initial treatment strategy for bilateral pulmonary nodules. EXAM: NUCLEAR MEDICINE PET SKULL BASE TO THIGH TECHNIQUE: 8.85 mCi F-18 FDG was injected intravenously. Full-ring PET imaging was performed from the skull base to thigh after the radiotracer. CT data was obtained and used for attenuation correction and anatomic localization. Fasting blood glucose: 111 mg/dl COMPARISON:  Chest CT 03/26/2021 and 06/02/2018. Abdominopelvic CT 04/16/2017. FINDINGS: Mediastinal blood pool activity: SUV max 2.7 NECK: No hypermetabolic cervical lymph nodes are identified.Nonspecific asymmetric metabolic activity in the left nasopharynx (SUV max 6.4). No focal abnormality identified on the CT images, and this could be muscular. No other abnormality of the pharyngeal mucosal space. There is additional physiologic muscular activity in the lower neck bilaterally. Incidental CT findings: none CHEST: There are no hypermetabolic mediastinal, hilar or axillary lymph nodes. Low level  activity within precordial lymph nodes (SUV max 2.3). These nodes appear grossly stable from 2018. There is only low level hypermetabolic activity associated with the multiple ill-defined nodules in both lungs, which are grossly unchanged from the chest CT of 6 weeks ago. The greatest metabolic activity is in the right lower lobe (SUV max 1.8). In the left lower lobe, there is mild metabolic activity associated with ill-defined nodularity (SUV max 1.5). No new or enlarging nodules are seen. Incidental CT findings: Mild coronary and aortic atherosclerosis. Moderate centrilobular and paraseptal emphysema. ABDOMEN/PELVIS: There is no hypermetabolic activity within the liver, adrenal glands, spleen or pancreas. There is no hypermetabolic nodal activity. Incidental CT findings: Cyst in the interpolar region of the right kidney, a left-sided bladder diverticulum and aortic and branch vessel atherosclerosis are noted. There are bowel anastomosis clips in the rectosigmoid area. Just proximal to the most distal anastomosis are two peripherally calcified intraluminal objects which have decreased in number from the 2018 and 2013 CTs. Although possibly pill fragments, these could reflect calcified gallstones and suggest a stricture at the rectosigmoid junction. SKELETON: There is no hypermetabolic activity to suggest osseous metastatic disease. As above, physiologic muscular activity in the lower neck. Incidental CT findings: none IMPRESSION: 1. The multiple ill-defined pulmonary nodules bilaterally do not demonstrate significant hypermetabolic activity and are favored to be inflammatory/infectious. These nodules are suboptimally evaluated by PET due to their small size, and CT follow-up in 3-4 months recommended. 2. No hypermetabolic adenopathy or suspicious visceral activity. 3. Nonspecific asymmetric activity in the left nasopharynx without clear CT correlate. This could be muscular. Consider ENT evaluation if clinically  warranted. 4. Incidental findings as described above, including a left-sided bladder diverticulum and suspected stricture at the rectosigmoid surgical anastomosis. Electronically Signed   By: Richardean Sale M.D.   On: 05/11/2021 11:21     ASSESSMENT:  1.  Multiple lung nodules: - CT chest without contrast on 03/26/2021 at [facility] showed multiple spiculated lung nodules throughout the chest concerning  for metastatic disease.  Group nodules in the right lower lobe do raise question of sequela of infection/inflammation.  Vague area of low-attenuation in the left hepatic lobe is nonspecific.  Small low-density lesions in bilateral kidneys likely cysts. - PET scan on 05/10/2021 showed multiple ill-defined pulmonary nodules bilaterally without significant hypermetabolic activity favored to be inflammatory/infectious.  No hypermetabolic adenopathy.  2.  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 - She smokes 1 pack/day for 45 years. - Father had lung cancer.  Mother had breast cancer.   PLAN:  1.  Bilateral lung nodules: - We have reviewed PET CT scan images with the patient. - Multiple lung nodules are below significant metabolic activity with a greatest metabolic activity with SUV 1.8.  No other adenopathy. - Smoking cessation was encouraged.  Recommend follow-up CT scan of the chest with contrast in 4 months.   Orders placed this encounter:  No orders of the defined types were placed in this encounter.    Derek Jack, MD [place] [phone]   I, Thana Ates, am acting as a scribe for Dr. Derek Jack.  I, Derek Jack MD, have reviewed the above documentation for accuracy and completeness, and I agree with the above.

## 2021-05-14 ENCOUNTER — Inpatient Hospital Stay (HOSPITAL_BASED_OUTPATIENT_CLINIC_OR_DEPARTMENT_OTHER): Payer: Medicare Other | Admitting: Hematology

## 2021-05-14 ENCOUNTER — Other Ambulatory Visit: Payer: Self-pay

## 2021-05-14 VITALS — BP 109/72 | HR 88 | Temp 99.0°F | Resp 20 | Wt 161.1 lb

## 2021-05-14 DIAGNOSIS — R918 Other nonspecific abnormal finding of lung field: Secondary | ICD-10-CM

## 2021-05-14 DIAGNOSIS — R911 Solitary pulmonary nodule: Secondary | ICD-10-CM | POA: Diagnosis not present

## 2021-05-14 NOTE — Patient Instructions (Addendum)
[place] at [facility] Discharge Instructions  You were seen today by Dr. Delton Coombes. He went over your recent results and scans. Stay hydrated by drinking plenty of water (about 2-3 liters a day). You will be scheduled for a CT scan of your chest prior to your next visit. Dr. Delton Coombes will see you back in 4 months for labs and follow up.   Thank you for choosing [place] at [facility] to provide your oncology and hematology care.  To afford each patient quality time with our provider, please arrive at least 15 minutes before your scheduled appointment time.   If you have a lab appointment with the [facility] please come in thru the Main Entrance and check in at the main information desk  You need to re-schedule your appointment should you arrive 10 or more minutes late.  We strive to give you quality time with our providers, and arriving late affects you and other patients whose appointments are after yours.  Also, if you no show three or more times for appointments you may be dismissed from the clinic at the providers discretion.     Again, thank you for choosing [facility].  Our hope is that these requests will decrease the amount of time that you wait before being seen by our physicians.       _____________________________________________________________  Should you have questions after your visit to [facility], please contact our office at [phone] between the hours of 8:00 a.m. and 4:30 p.m.  Voicemails left after 4:00 p.m. will not be returned until the following business day.  For prescription refill requests, have your pharmacy contact our office and allow 72 hours.    Cancer Center Support Programs:   > Cancer Support Group  2nd Tuesday of the month 1pm-2pm, Journey Room

## 2021-09-19 ENCOUNTER — Ambulatory Visit (HOSPITAL_COMMUNITY)
Admission: RE | Admit: 2021-09-19 | Discharge: 2021-09-19 | Disposition: A | Discharge status: Home / Self Care | Payer: Medicare Other | Source: Ambulatory Visit | Attending: Hematology | Admitting: Hematology

## 2021-09-19 ENCOUNTER — Other Ambulatory Visit: Payer: Self-pay

## 2021-09-19 ENCOUNTER — Inpatient Hospital Stay (HOSPITAL_COMMUNITY): Payer: Medicare Other | Attending: Hematology

## 2021-09-19 DIAGNOSIS — R918 Other nonspecific abnormal finding of lung field: Secondary | ICD-10-CM

## 2021-09-19 LAB — COMPREHENSIVE METABOLIC PANEL
ALT: 10 U/L (ref 0–44)
AST: 16 U/L (ref 15–41)
Albumin: 4 g/dL (ref 3.5–5.0)
Alkaline Phosphatase: 78 U/L (ref 38–126)
Anion gap: 8 (ref 5–15)
BUN: 8 mg/dL (ref 8–23)
CO2: 28 mmol/L (ref 22–32)
Calcium: 9.1 mg/dL (ref 8.9–10.3)
Chloride: 100 mmol/L (ref 98–111)
Creatinine, Ser: 1.04 mg/dL — ABNORMAL HIGH (ref 0.44–1.00)
GFR, Estimated: >60 mL/min (ref >60)
Glucose, Bld: 134 mg/dL — ABNORMAL HIGH (ref 70–99)
Potassium: 4.1 mmol/L (ref 3.5–5.1)
Sodium: 136 mmol/L (ref 135–145)
Total Bilirubin: 0.4 mg/dL (ref 0.3–1.2)
Total Protein: 7.3 g/dL (ref 6.5–8.1)

## 2021-09-19 LAB — CBC WITH DIFFERENTIAL/PLATELET
Abs Immature Granulocytes: 0.03 10*3/uL (ref 0.00–0.07)
Basophils Absolute: 0.1 10*3/uL (ref 0.0–0.1)
Basophils Relative: 1 %
Eosinophils Absolute: 0.1 10*3/uL (ref 0.0–0.5)
Eosinophils Relative: 1 %
HCT: 42.5 % (ref 36.0–46.0)
Hemoglobin: 13.7 g/dL (ref 12.0–15.0)
Immature Granulocytes: 1 %
Lymphocytes Relative: 22 %
Lymphs Abs: 1.4 10*3/uL (ref 0.7–4.0)
MCH: 30.4 pg (ref 26.0–34.0)
MCHC: 32.2 g/dL (ref 30.0–36.0)
MCV: 94.4 fL (ref 80.0–100.0)
Monocytes Absolute: 0.5 10*3/uL (ref 0.1–1.0)
Monocytes Relative: 7 %
Neutro Abs: 4.5 10*3/uL (ref 1.7–7.7)
Neutrophils Relative %: 68 %
Platelets: 293 10*3/uL (ref 150–400)
RBC: 4.5 MIL/uL (ref 3.87–5.11)
RDW: 12.8 % (ref 11.5–15.5)
WBC: 6.5 10*3/uL (ref 4.0–10.5)
nRBC: 0 % (ref 0.0–0.2)

## 2021-09-19 MED ORDER — IOHEXOL 300 MG/ML  SOLN
100.0000 mL | Freq: Once | INTRAMUSCULAR | Status: AC | PRN
  Administered 2021-09-19: 75 mL via INTRAVENOUS

## 2021-09-26 ENCOUNTER — Inpatient Hospital Stay (HOSPITAL_COMMUNITY): Payer: Medicare Other | Admitting: Hematology

## 2021-10-22 ENCOUNTER — Other Ambulatory Visit: Payer: Self-pay

## 2021-10-22 ENCOUNTER — Inpatient Hospital Stay (HOSPITAL_COMMUNITY): Payer: Medicare Other | Attending: Hematology | Admitting: Hematology

## 2021-10-22 VITALS — BP 116/81 | HR 80 | Temp 97.9°F | Resp 16 | Wt 167.2 lb

## 2021-10-22 DIAGNOSIS — F419 Anxiety disorder, unspecified: Secondary | ICD-10-CM | POA: Insufficient documentation

## 2021-10-22 DIAGNOSIS — F17210 Nicotine dependence, cigarettes, uncomplicated: Secondary | ICD-10-CM | POA: Insufficient documentation

## 2021-10-22 DIAGNOSIS — E785 Hyperlipidemia, unspecified: Secondary | ICD-10-CM | POA: Diagnosis not present

## 2021-10-22 DIAGNOSIS — J449 Chronic obstructive pulmonary disease, unspecified: Secondary | ICD-10-CM | POA: Insufficient documentation

## 2021-10-22 DIAGNOSIS — Z801 Family history of malignant neoplasm of trachea, bronchus and lung: Secondary | ICD-10-CM | POA: Insufficient documentation

## 2021-10-22 DIAGNOSIS — Z79899 Other long term (current) drug therapy: Secondary | ICD-10-CM | POA: Diagnosis not present

## 2021-10-22 DIAGNOSIS — M1990 Unspecified osteoarthritis, unspecified site: Secondary | ICD-10-CM | POA: Insufficient documentation

## 2021-10-22 DIAGNOSIS — J45909 Unspecified asthma, uncomplicated: Secondary | ICD-10-CM | POA: Diagnosis not present

## 2021-10-22 DIAGNOSIS — R911 Solitary pulmonary nodule: Secondary | ICD-10-CM | POA: Diagnosis not present

## 2021-10-22 DIAGNOSIS — Z803 Family history of malignant neoplasm of breast: Secondary | ICD-10-CM | POA: Insufficient documentation

## 2021-10-22 DIAGNOSIS — R918 Other nonspecific abnormal finding of lung field: Secondary | ICD-10-CM | POA: Diagnosis not present

## 2021-10-22 NOTE — Patient Instructions (Signed)
[facility] at [facility] Discharge Instructions  You were seen today by Dr. Katragadda. He went over your recent results. Dr. Katragadda will see you back in 6 months for labs and follow up.   Thank you for choosing [facility] at [facility] to provide your oncology and hematology care.  To afford each patient quality time with our provider, please arrive at least 15 minutes before your scheduled appointment time.   If you have a lab appointment with the Cancer Center please come in thru the Main Entrance and check in at the main information desk  You need to re-schedule your appointment should you arrive 10 or more minutes late.  We strive to give you quality time with our providers, and arriving late affects you and other patients whose appointments are after yours.  Also, if you no show three or more times for appointments you may be dismissed from the clinic at the providers discretion.     Again, thank you for choosing [facility].  Our hope is that these requests will decrease the amount of time that you wait before being seen by our physicians.       _____________________________________________________________  Should you have questions after your visit to [facility], please contact our office at [phone] between the hours of 8:00 a.m. and 4:30 p.m.  Voicemails left after 4:00 p.m. will not be returned until the following business day.  For prescription refill requests, have your pharmacy contact our office and allow 72 hours.    Cancer Center Support Programs:   > Cancer Support Group  2nd Tuesday of the month 1pm-2pm, Journey Room    

## 2021-10-22 NOTE — Progress Notes (Signed)
Tamara Castro,  [id number]   CLINIC:  Medical Oncology/Hematology  PCP:  [facility] [place] / [place] [address] [phone]   REASON FOR VISIT:  Follow-up for pulmonary nodule  PRIOR THERAPY: none  NGS Results: not done  CURRENT THERAPY: pulmonary nodule  BRIEF ONCOLOGIC HISTORY:  Oncology History   No history exists.    CANCER STAGING: Cancer Staging  No matching staging information was found for the patient.  INTERVAL HISTORY:  Tamara Castro, a 61 y.o. female, returns for routine follow-up of her pulmonary nodule. Tamara Castro was last seen on 05/14/2021.   Today she reports feeling good. She continues to smoke, and she is smoking 10 cigarettes daily.   REVIEW OF SYSTEMS:  Review of Systems  Constitutional:  Negative for appetite change and fatigue.  Respiratory:  Positive for shortness of breath.   Cardiovascular:  Positive for palpitations.  Neurological:  Positive for numbness (feet - stable).  All other systems reviewed and are negative.  PAST MEDICAL/SURGICAL HISTORY:  Past Medical History:  Diagnosis Date   Anxiety    Asthma    Bladder polyps    per pt, urethral polyps   COPD (chronic obstructive pulmonary disease) (HCC)    Dysplasia of cervix    Hyperlipidemia    Osteoarthritis    Past Surgical History:  Procedure Laterality Date   ABDOMINAL HYSTERECTOMY     CHOLECYSTECTOMY     TONSILLECTOMY Bilateral    TOTAL COLECTOMY  1998    SOCIAL HISTORY:  Social History   Socioeconomic History   Marital status: Divorced    Spouse name: Not on file   Number of children: 2   Years of education: Not on file   Highest education level: Not on file  Occupational History   Occupation: Disability since 2013  Tobacco Use   Smoking status: Some Days    Packs/day: 1.00    Years: 40.00    Pack years: 40.00    Types: Cigarettes   Smokeless tobacco: Never   Tobacco comments:    Pt  is down to smoking 6 cigarettes a day  Vaping Use   Vaping Use: Former  Substance and Sexual Activity   Alcohol use: Not Currently   Drug use: Not Currently    Comment: hx of smoking marijuana   Sexual activity: Not Currently  Other Topics Concern   Not on file  Social History Narrative   Not on file   Social Determinants of Health   Financial Resource Strain: Low Risk    Difficulty of Paying Living Expenses: Not hard at all  Food Insecurity: No Food Insecurity   Worried About Charity fundraiser in the Last Year: Never true   Ran Out of Food in the Last Year: Never true  Transportation Needs: No Transportation Needs   Lack of Transportation (Medical): No   Lack of Transportation (Non-Medical): No  Physical Activity: Insufficiently Active   Days of Exercise per Week: 3 days   Minutes of Exercise per Session: 20 min  Stress: No Stress Concern Present   Feeling of Stress : Only a little  Social Connections: Socially Isolated   Frequency of Communication with Friends and Family: More than three times a week   Frequency of Social Gatherings with Friends and Family: Three times a week   Attends Religious Services: Never   Active Member of Clubs or Organizations: No   Attends Archivist  Meetings: Never   Marital Status: Divorced  Human resources officer Violence: Not At Risk   Fear of Current or Ex-Partner: No   Emotionally Abused: No   Physically Abused: No   Sexually Abused: No    FAMILY HISTORY:  No family history on file.  CURRENT MEDICATIONS:  Current Outpatient Medications  Medication Sig Dispense Refill   ALPRAZolam (XANAX) 1 MG tablet Take 1 mg by mouth 4 (four) times daily.     ANORO ELLIPTA 62.5-25 MCG/INH AEPB Inhale 1 puff into the lungs daily.     cefUROXime (CEFTIN) 500 MG tablet Take 500 mg by mouth 2 (two) times daily.     diphenhydrAMINE HCl, Sleep, (ZZZQUIL PO) Take 2 tablets by mouth at bedtime. Pt takes 2 gummies every night     escitalopram  (LEXAPRO) 10 MG tablet Take 10 mg by mouth 2 (two) times daily.     ezetimibe (ZETIA) 10 MG tablet SMARTSIG:1 Tablet(s) By Mouth Every Evening     gabapentin (NEURONTIN) 400 MG capsule Take 800 mg by mouth 3 (three) times daily as needed. (Patient not taking: Reported on 05/14/2021)     PROAIR HFA 108 (90 Base) MCG/ACT inhaler Inhale 2 puffs into the lungs 4 (four) times daily.     No current facility-administered medications for this visit.    ALLERGIES:  Allergies  Allergen Reactions   Ciprofloxacin Diarrhea    Per pt, this medication gave her c-diff    PHYSICAL EXAM:  Performance status (ECOG): 1 - Symptomatic but completely ambulatory  There were no vitals filed for this visit. Wt Readings from Last 3 Encounters:  05/14/21 161 lb 1.6 oz (73.1 kg)  04/10/21 157 lb 14.4 oz (71.6 kg)   Physical Exam Vitals reviewed.  Constitutional:      Appearance: Normal appearance.     Comments: In wheelchair  Cardiovascular:     Rate and Rhythm: Normal rate and regular rhythm.     Pulses: Normal pulses.     Heart sounds: Normal heart sounds.  Pulmonary:     Effort: Pulmonary effort is normal.     Breath sounds: Normal breath sounds.  Neurological:     General: No focal deficit present.     Mental Status: She is alert and oriented to person, place, and time.  Psychiatric:        Mood and Affect: Mood normal.        Behavior: Behavior normal.     LABORATORY DATA:  I have reviewed the labs as listed.  CBC Latest Ref Rng & Units 09/19/2021 05/07/2021  WBC 4.0 - 10.5 K/uL 6.5 7.2  Hemoglobin 12.0 - 15.0 g/dL 13.7 13.3  Hematocrit 36.0 - 46.0 % 42.5 40.9  Platelets 150 - 400 K/uL 293 329   CMP Latest Ref Rng & Units 09/19/2021 05/07/2021  Glucose 70 - 99 mg/dL 134(H) 98  BUN 8 - 23 mg/dL 8 9  Creatinine 0.44 - 1.00 mg/dL 1.04(H) 1.03(H)  Sodium 135 - 145 mmol/L 136 134(L)  Potassium 3.5 - 5.1 mmol/L 4.1 4.3  Chloride 98 - 111 mmol/L 100 102  CO2 22 - 32 mmol/L 28 26  Calcium 8.9 -  10.3 mg/dL 9.1 9.0  Total Protein 6.5 - 8.1 g/dL 7.3 7.2  Total Bilirubin 0.3 - 1.2 mg/dL 0.4 0.4  Alkaline Phos 38 - 126 U/L 78 79  AST 15 - 41 U/L 16 17  ALT 0 - 44 U/L 10 12    DIAGNOSTIC IMAGING:  I have independently reviewed  the scans and discussed with the patient. No results found.   ASSESSMENT:  1.  Multiple lung nodules: - CT chest without contrast on 03/26/2021 at [facility] showed multiple spiculated lung nodules throughout the chest concerning for metastatic disease.  Group nodules in the right lower lobe do raise question of sequela of infection/inflammation.  Vague area of low-attenuation in the left hepatic lobe is nonspecific.  Small low-density lesions in bilateral kidneys likely cysts. - PET scan on 05/10/2021 showed multiple ill-defined pulmonary nodules bilaterally without significant hypermetabolic activity favored to be inflammatory/infectious.  No hypermetabolic adenopathy.  2.  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 - She smokes 1 pack/day for 45 years. - Father had lung cancer.  Mother had breast cancer.   PLAN:  1.  Bilateral lung nodules: - She is currently smoking 10 cigarettes/day. - Reviewed CT chest from 09/19/2021.  It showed previously seen numerous lung nodules in both lungs on 03/26/2021 chest CT have largely resolved.  Few scattered residual small indistinct nodules in both lungs, largest 0.5 cm in the peripheral right upper lobe.  No significant new or enlarging lung nodules.  I have also discussed other known nodule findings on the scan. - Reviewed labs from 09/19/2021 which showed CMP and CBC within normal limits with mildly elevated creatinine. - I have counseled her to quit smoking. - Recommend follow-up in 6 months with repeat CT of the chest.   Orders placed this encounter:  No orders of the defined types were placed in this encounter.    Derek Jack, MD [place] [phone]   I, Thana Ates, am acting as a scribe for Dr. Derek Jack.  I, Derek Jack MD, have reviewed the above documentation for accuracy and completeness, and I agree with the above.

## 2022-04-18 ENCOUNTER — Other Ambulatory Visit: Payer: Self-pay

## 2022-04-18 DIAGNOSIS — D649 Anemia, unspecified: Secondary | ICD-10-CM

## 2022-04-18 DIAGNOSIS — R918 Other nonspecific abnormal finding of lung field: Secondary | ICD-10-CM

## 2022-04-19 ENCOUNTER — Encounter (HOSPITAL_COMMUNITY): Payer: Self-pay

## 2022-04-19 ENCOUNTER — Ambulatory Visit (HOSPITAL_COMMUNITY): Payer: Medicare Other

## 2022-04-19 ENCOUNTER — Inpatient Hospital Stay: Payer: Medicare Other

## 2022-04-25 ENCOUNTER — Ambulatory Visit (HOSPITAL_COMMUNITY): Payer: Medicare Other | Admitting: Hematology

## 2022-04-29 ENCOUNTER — Ambulatory Visit (HOSPITAL_COMMUNITY): Payer: Medicare Other | Admitting: Hematology

## 2022-05-01 ENCOUNTER — Inpatient Hospital Stay: Payer: Medicare Other

## 2022-05-01 ENCOUNTER — Ambulatory Visit (HOSPITAL_COMMUNITY): Payer: Medicare Other | Admitting: Hematology

## 2022-05-01 ENCOUNTER — Ambulatory Visit (HOSPITAL_COMMUNITY): Payer: Medicare Other

## 2022-05-06 ENCOUNTER — Ambulatory Visit: Payer: Medicare Other | Admitting: Hematology

## 2022-05-14 ENCOUNTER — Ambulatory Visit: Payer: Medicare Other | Admitting: Physician Assistant

## 2022-06-21 ENCOUNTER — Ambulatory Visit (HOSPITAL_COMMUNITY): Payer: Medicare Other

## 2022-06-21 ENCOUNTER — Inpatient Hospital Stay: Payer: Medicare Other

## 2022-07-02 ENCOUNTER — Ambulatory Visit: Payer: Medicare Other | Admitting: Physician Assistant

## 2022-07-08 ENCOUNTER — Ambulatory Visit: Payer: Medicare Other | Admitting: Physician Assistant

## 2023-01-18 IMAGING — CT NM PET TUM IMG INITIAL (PI) SKULL BASE T - THIGH
1 of 7 series · 1 of 25 positions shown · non-contrast
Comparison: Chest CT 03/26/2021 and 06/02/2018. Abdominopelvic CT
04/16/2017.

CLINICAL DATA: Initial treatment strategy for bilateral pulmonary
nodules.

EXAM:
NUCLEAR MEDICINE PET SKULL BASE TO THIGH
TECHNIQUE: 8.85 mCi F-18 FDG was injected intravenously. Full-ring PET imaging
was performed from the skull base to thigh after the radiotracer. CT
data was obtained and used for attenuation correction and anatomic
localization.
Fasting blood glucose: 111 mg/dl

[Series 3: ctac · axial · 5.0mm · 0.98mm/px · 1 of 293 slices shown]
[im 293/293  brain]
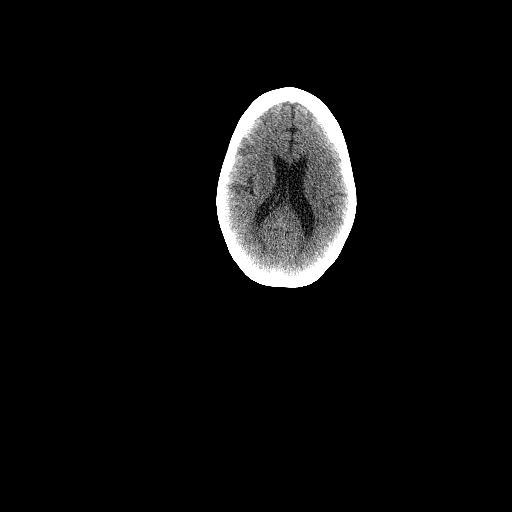

[1 of 25 positions shown; findings below may reference images not displayed]

FINDINGS: Mediastinal blood pool activity: SUV max

NECK:

No hypermetabolic cervical lymph nodes are identified.Nonspecific
asymmetric metabolic activity in the left nasopharynx (SUV max 6.4).
No focal abnormality identified on the CT images, and this could be
muscular. No other abnormality of the pharyngeal mucosal space.
There is additional physiologic muscular activity in the lower neck
bilaterally.

Incidental CT findings: none

CHEST:

There are no hypermetabolic mediastinal, hilar or axillary lymph
nodes. Low level activity within precordial lymph nodes (SUV max
2.3). These nodes appear grossly stable from 1314. There is only low
level hypermetabolic activity associated with the multiple
ill-defined nodules in both lungs, which are grossly unchanged from
the chest CT of 6 weeks ago. The greatest metabolic activity is in
the right lower lobe (SUV max 1.8). In the left lower lobe, there is
mild metabolic activity associated with ill-defined nodularity (SUV
max 1.5). No new or enlarging nodules are seen.

Incidental CT findings: Mild coronary and aortic atherosclerosis.
Moderate centrilobular and paraseptal emphysema.

ABDOMEN/PELVIS:

There is no hypermetabolic activity within the liver, adrenal
glands, spleen or pancreas. There is no hypermetabolic nodal
activity.

Incidental CT findings: Cyst in the interpolar region of the right
kidney, a left-sided bladder diverticulum and aortic and branch
vessel atherosclerosis are noted. There are bowel anastomosis clips
in the rectosigmoid area. Just proximal to the most distal
anastomosis are two peripherally calcified intraluminal objects
which have decreased in number from the 1314 and 1949 CTs. Although
possibly pill fragments, these could reflect calcified gallstones
and suggest a stricture at the rectosigmoid junction.

SKELETON:

There is no hypermetabolic activity to suggest osseous metastatic
disease. As above, physiologic muscular activity in the lower neck.

Incidental CT findings: none
IMPRESSION: 1. The multiple ill-defined pulmonary nodules bilaterally do not
demonstrate significant hypermetabolic activity and are favored to
be inflammatory/infectious. These nodules are suboptimally evaluated
by PET due to their small size, and CT follow-up in 3-4 months
recommended.
2. No hypermetabolic adenopathy or suspicious visceral activity.
3. Nonspecific asymmetric activity in the left nasopharynx without
clear CT correlate. This could be muscular. Consider ENT evaluation
if clinically warranted.
4. Incidental findings as described above, including a left-sided
bladder diverticulum and suspected stricture at the rectosigmoid
surgical anastomosis.

## 2023-02-20 DIAGNOSIS — Z720 Tobacco use: Secondary | ICD-10-CM | POA: Diagnosis not present

## 2023-02-20 DIAGNOSIS — E785 Hyperlipidemia, unspecified: Secondary | ICD-10-CM | POA: Diagnosis not present

## 2023-02-20 DIAGNOSIS — Z5320 Procedure and treatment not carried out because of patient's decision for unspecified reasons: Secondary | ICD-10-CM | POA: Diagnosis not present

## 2023-02-20 DIAGNOSIS — M797 Fibromyalgia: Secondary | ICD-10-CM | POA: Diagnosis not present

## 2023-02-20 DIAGNOSIS — I700 Atherosclerosis of aorta: Secondary | ICD-10-CM | POA: Diagnosis not present

## 2023-02-20 DIAGNOSIS — K7689 Other specified diseases of liver: Secondary | ICD-10-CM | POA: Diagnosis not present

## 2023-02-20 DIAGNOSIS — J449 Chronic obstructive pulmonary disease, unspecified: Secondary | ICD-10-CM | POA: Diagnosis not present

## 2023-02-20 DIAGNOSIS — N281 Cyst of kidney, acquired: Secondary | ICD-10-CM | POA: Diagnosis not present

## 2023-02-20 DIAGNOSIS — M545 Low back pain, unspecified: Secondary | ICD-10-CM | POA: Diagnosis not present

## 2023-03-21 DIAGNOSIS — I50810 Right heart failure, unspecified: Secondary | ICD-10-CM | POA: Diagnosis not present

## 2023-03-21 DIAGNOSIS — I5020 Unspecified systolic (congestive) heart failure: Secondary | ICD-10-CM | POA: Diagnosis not present

## 2023-03-21 DIAGNOSIS — I21A1 Myocardial infarction type 2: Secondary | ICD-10-CM | POA: Diagnosis not present

## 2023-03-21 DIAGNOSIS — E8729 Other acidosis: Secondary | ICD-10-CM | POA: Diagnosis not present

## 2023-03-21 DIAGNOSIS — I499 Cardiac arrhythmia, unspecified: Secondary | ICD-10-CM | POA: Diagnosis not present

## 2023-03-21 DIAGNOSIS — Z743 Need for continuous supervision: Secondary | ICD-10-CM | POA: Diagnosis not present

## 2023-03-21 DIAGNOSIS — Z7952 Long term (current) use of systemic steroids: Secondary | ICD-10-CM | POA: Diagnosis not present

## 2023-03-21 DIAGNOSIS — J9622 Acute and chronic respiratory failure with hypercapnia: Secondary | ICD-10-CM | POA: Diagnosis not present

## 2023-03-21 DIAGNOSIS — J9601 Acute respiratory failure with hypoxia: Secondary | ICD-10-CM | POA: Diagnosis not present

## 2023-03-21 DIAGNOSIS — J441 Chronic obstructive pulmonary disease with (acute) exacerbation: Secondary | ICD-10-CM | POA: Diagnosis not present

## 2023-03-21 DIAGNOSIS — Z9049 Acquired absence of other specified parts of digestive tract: Secondary | ICD-10-CM | POA: Diagnosis not present

## 2023-03-21 DIAGNOSIS — F32A Depression, unspecified: Secondary | ICD-10-CM | POA: Diagnosis not present

## 2023-03-21 DIAGNOSIS — I130 Hypertensive heart and chronic kidney disease with heart failure and stage 1 through stage 4 chronic kidney disease, or unspecified chronic kidney disease: Secondary | ICD-10-CM | POA: Diagnosis not present

## 2023-03-21 DIAGNOSIS — R030 Elevated blood-pressure reading, without diagnosis of hypertension: Secondary | ICD-10-CM | POA: Diagnosis not present

## 2023-03-21 DIAGNOSIS — E237 Disorder of pituitary gland, unspecified: Secondary | ICD-10-CM | POA: Diagnosis not present

## 2023-03-21 DIAGNOSIS — Z1152 Encounter for screening for COVID-19: Secondary | ICD-10-CM | POA: Diagnosis not present

## 2023-03-21 DIAGNOSIS — K5900 Constipation, unspecified: Secondary | ICD-10-CM | POA: Diagnosis not present

## 2023-03-21 DIAGNOSIS — A411 Sepsis due to other specified staphylococcus: Secondary | ICD-10-CM | POA: Diagnosis not present

## 2023-03-21 DIAGNOSIS — D72829 Elevated white blood cell count, unspecified: Secondary | ICD-10-CM | POA: Diagnosis not present

## 2023-03-21 DIAGNOSIS — R0602 Shortness of breath: Secondary | ICD-10-CM | POA: Diagnosis not present

## 2023-03-21 DIAGNOSIS — J439 Emphysema, unspecified: Secondary | ICD-10-CM | POA: Diagnosis not present

## 2023-03-21 DIAGNOSIS — F17210 Nicotine dependence, cigarettes, uncomplicated: Secondary | ICD-10-CM | POA: Diagnosis not present

## 2023-03-21 DIAGNOSIS — I10 Essential (primary) hypertension: Secondary | ICD-10-CM | POA: Diagnosis not present

## 2023-03-21 DIAGNOSIS — J9691 Respiratory failure, unspecified with hypoxia: Secondary | ICD-10-CM | POA: Diagnosis not present

## 2023-03-21 DIAGNOSIS — E079 Disorder of thyroid, unspecified: Secondary | ICD-10-CM | POA: Diagnosis not present

## 2023-03-21 DIAGNOSIS — N189 Chronic kidney disease, unspecified: Secondary | ICD-10-CM | POA: Diagnosis not present

## 2023-03-21 DIAGNOSIS — I2510 Atherosclerotic heart disease of native coronary artery without angina pectoris: Secondary | ICD-10-CM | POA: Diagnosis not present

## 2023-03-21 DIAGNOSIS — I272 Pulmonary hypertension, unspecified: Secondary | ICD-10-CM | POA: Diagnosis not present

## 2023-03-21 DIAGNOSIS — R911 Solitary pulmonary nodule: Secondary | ICD-10-CM | POA: Diagnosis not present

## 2023-03-21 DIAGNOSIS — I110 Hypertensive heart disease with heart failure: Secondary | ICD-10-CM | POA: Diagnosis not present

## 2023-03-21 DIAGNOSIS — R778 Other specified abnormalities of plasma proteins: Secondary | ICD-10-CM | POA: Diagnosis not present

## 2023-03-21 DIAGNOSIS — R918 Other nonspecific abnormal finding of lung field: Secondary | ICD-10-CM | POA: Diagnosis not present

## 2023-03-21 DIAGNOSIS — A419 Sepsis, unspecified organism: Secondary | ICD-10-CM | POA: Diagnosis not present

## 2023-03-21 DIAGNOSIS — B9689 Other specified bacterial agents as the cause of diseases classified elsewhere: Secondary | ICD-10-CM | POA: Diagnosis not present

## 2023-03-21 DIAGNOSIS — Z9989 Dependence on other enabling machines and devices: Secondary | ICD-10-CM | POA: Diagnosis not present

## 2023-03-21 DIAGNOSIS — Z9911 Dependence on respirator [ventilator] status: Secondary | ICD-10-CM | POA: Diagnosis not present

## 2023-03-21 DIAGNOSIS — I959 Hypotension, unspecified: Secondary | ICD-10-CM | POA: Diagnosis not present

## 2023-03-21 DIAGNOSIS — I3139 Other pericardial effusion (noninflammatory): Secondary | ICD-10-CM | POA: Diagnosis not present

## 2023-03-21 DIAGNOSIS — Z7951 Long term (current) use of inhaled steroids: Secondary | ICD-10-CM | POA: Diagnosis not present

## 2023-03-21 DIAGNOSIS — J9602 Acute respiratory failure with hypercapnia: Secondary | ICD-10-CM | POA: Diagnosis not present

## 2023-03-21 DIAGNOSIS — Z792 Long term (current) use of antibiotics: Secondary | ICD-10-CM | POA: Diagnosis not present

## 2023-03-21 DIAGNOSIS — J432 Centrilobular emphysema: Secondary | ICD-10-CM | POA: Diagnosis not present

## 2023-03-21 DIAGNOSIS — N179 Acute kidney failure, unspecified: Secondary | ICD-10-CM | POA: Diagnosis not present

## 2023-03-21 DIAGNOSIS — E7800 Pure hypercholesterolemia, unspecified: Secondary | ICD-10-CM | POA: Diagnosis not present

## 2023-03-21 DIAGNOSIS — R069 Unspecified abnormalities of breathing: Secondary | ICD-10-CM | POA: Diagnosis not present

## 2023-03-21 DIAGNOSIS — Z452 Encounter for adjustment and management of vascular access device: Secondary | ICD-10-CM | POA: Diagnosis not present

## 2023-03-21 DIAGNOSIS — J449 Chronic obstructive pulmonary disease, unspecified: Secondary | ICD-10-CM | POA: Diagnosis not present

## 2023-03-21 DIAGNOSIS — R7881 Bacteremia: Secondary | ICD-10-CM | POA: Diagnosis not present

## 2023-03-21 DIAGNOSIS — G629 Polyneuropathy, unspecified: Secondary | ICD-10-CM | POA: Diagnosis not present

## 2023-03-21 DIAGNOSIS — B957 Other staphylococcus as the cause of diseases classified elsewhere: Secondary | ICD-10-CM | POA: Diagnosis not present

## 2023-03-21 DIAGNOSIS — K6389 Other specified diseases of intestine: Secondary | ICD-10-CM | POA: Diagnosis not present

## 2023-03-21 DIAGNOSIS — R7989 Other specified abnormal findings of blood chemistry: Secondary | ICD-10-CM | POA: Diagnosis not present

## 2023-03-21 DIAGNOSIS — I700 Atherosclerosis of aorta: Secondary | ICD-10-CM | POA: Diagnosis not present

## 2023-03-21 DIAGNOSIS — J440 Chronic obstructive pulmonary disease with acute lower respiratory infection: Secondary | ICD-10-CM | POA: Diagnosis not present

## 2023-03-21 DIAGNOSIS — R6520 Severe sepsis without septic shock: Secondary | ICD-10-CM | POA: Diagnosis not present

## 2023-03-21 DIAGNOSIS — I5022 Chronic systolic (congestive) heart failure: Secondary | ICD-10-CM | POA: Diagnosis not present

## 2023-03-21 DIAGNOSIS — Z4682 Encounter for fitting and adjustment of non-vascular catheter: Secondary | ICD-10-CM | POA: Diagnosis not present

## 2023-03-21 DIAGNOSIS — N281 Cyst of kidney, acquired: Secondary | ICD-10-CM | POA: Diagnosis not present

## 2023-03-21 DIAGNOSIS — Z79899 Other long term (current) drug therapy: Secondary | ICD-10-CM | POA: Diagnosis not present

## 2023-03-21 DIAGNOSIS — R6521 Severe sepsis with septic shock: Secondary | ICD-10-CM | POA: Diagnosis not present

## 2023-03-21 DIAGNOSIS — M797 Fibromyalgia: Secondary | ICD-10-CM | POA: Diagnosis not present

## 2023-03-21 DIAGNOSIS — Z7982 Long term (current) use of aspirin: Secondary | ICD-10-CM | POA: Diagnosis not present

## 2023-03-21 DIAGNOSIS — J4489 Other specified chronic obstructive pulmonary disease: Secondary | ICD-10-CM | POA: Diagnosis not present

## 2023-03-21 DIAGNOSIS — R6889 Other general symptoms and signs: Secondary | ICD-10-CM | POA: Diagnosis not present

## 2023-03-21 DIAGNOSIS — R0902 Hypoxemia: Secondary | ICD-10-CM | POA: Diagnosis not present

## 2023-03-21 DIAGNOSIS — F17200 Nicotine dependence, unspecified, uncomplicated: Secondary | ICD-10-CM | POA: Diagnosis not present

## 2023-04-01 ENCOUNTER — Inpatient Hospital Stay (HOSPITAL_COMMUNITY): Payer: 59

## 2023-04-01 ENCOUNTER — Inpatient Hospital Stay (HOSPITAL_COMMUNITY)
Admission: AD | Admit: 2023-04-01 | Discharge: 2023-04-28 | Disposition: A | Discharge status: Nursing Home (Includes ICF) | Payer: 59 | Source: Other Acute Inpatient Hospital | Attending: Critical Care Medicine | Admitting: Critical Care Medicine

## 2023-04-01 ENCOUNTER — Encounter (HOSPITAL_COMMUNITY): Payer: Self-pay

## 2023-04-01 DIAGNOSIS — Z430 Encounter for attention to tracheostomy: Secondary | ICD-10-CM | POA: Diagnosis not present

## 2023-04-01 DIAGNOSIS — Z9049 Acquired absence of other specified parts of digestive tract: Secondary | ICD-10-CM

## 2023-04-01 DIAGNOSIS — Z720 Tobacco use: Secondary | ICD-10-CM | POA: Insufficient documentation

## 2023-04-01 DIAGNOSIS — J441 Chronic obstructive pulmonary disease with (acute) exacerbation: Secondary | ICD-10-CM | POA: Diagnosis not present

## 2023-04-01 DIAGNOSIS — I5043 Acute on chronic combined systolic (congestive) and diastolic (congestive) heart failure: Secondary | ICD-10-CM | POA: Diagnosis present

## 2023-04-01 DIAGNOSIS — F17210 Nicotine dependence, cigarettes, uncomplicated: Secondary | ICD-10-CM | POA: Diagnosis not present

## 2023-04-01 DIAGNOSIS — E872 Acidosis, unspecified: Secondary | ICD-10-CM

## 2023-04-01 DIAGNOSIS — H5702 Anisocoria: Secondary | ICD-10-CM | POA: Diagnosis not present

## 2023-04-01 DIAGNOSIS — F32A Depression, unspecified: Secondary | ICD-10-CM | POA: Diagnosis not present

## 2023-04-01 DIAGNOSIS — I21A1 Myocardial infarction type 2: Secondary | ICD-10-CM | POA: Diagnosis not present

## 2023-04-01 DIAGNOSIS — J9621 Acute and chronic respiratory failure with hypoxia: Secondary | ICD-10-CM | POA: Diagnosis not present

## 2023-04-01 DIAGNOSIS — R34 Anuria and oliguria: Secondary | ICD-10-CM | POA: Diagnosis not present

## 2023-04-01 DIAGNOSIS — J9601 Acute respiratory failure with hypoxia: Secondary | ICD-10-CM | POA: Diagnosis not present

## 2023-04-01 DIAGNOSIS — E871 Hypo-osmolality and hyponatremia: Secondary | ICD-10-CM | POA: Diagnosis not present

## 2023-04-01 DIAGNOSIS — Z4682 Encounter for fitting and adjustment of non-vascular catheter: Secondary | ICD-10-CM | POA: Diagnosis not present

## 2023-04-01 DIAGNOSIS — J9602 Acute respiratory failure with hypercapnia: Secondary | ICD-10-CM | POA: Diagnosis not present

## 2023-04-01 DIAGNOSIS — D72829 Elevated white blood cell count, unspecified: Secondary | ICD-10-CM

## 2023-04-01 DIAGNOSIS — M797 Fibromyalgia: Secondary | ICD-10-CM | POA: Diagnosis present

## 2023-04-01 DIAGNOSIS — J690 Pneumonitis due to inhalation of food and vomit: Secondary | ICD-10-CM | POA: Diagnosis not present

## 2023-04-01 DIAGNOSIS — A419 Sepsis, unspecified organism: Secondary | ICD-10-CM | POA: Diagnosis present

## 2023-04-01 DIAGNOSIS — Z930 Tracheostomy status: Secondary | ICD-10-CM

## 2023-04-01 DIAGNOSIS — J1569 Pneumonia due to other gram-negative bacteria: Secondary | ICD-10-CM | POA: Diagnosis not present

## 2023-04-01 DIAGNOSIS — E875 Hyperkalemia: Secondary | ICD-10-CM | POA: Diagnosis not present

## 2023-04-01 DIAGNOSIS — Z87410 Personal history of cervical dysplasia: Secondary | ICD-10-CM

## 2023-04-01 DIAGNOSIS — Z6827 Body mass index (BMI) 27.0-27.9, adult: Secondary | ICD-10-CM

## 2023-04-01 DIAGNOSIS — G4700 Insomnia, unspecified: Secondary | ICD-10-CM | POA: Diagnosis present

## 2023-04-01 DIAGNOSIS — G9340 Encephalopathy, unspecified: Secondary | ICD-10-CM | POA: Diagnosis not present

## 2023-04-01 DIAGNOSIS — J439 Emphysema, unspecified: Secondary | ICD-10-CM | POA: Diagnosis not present

## 2023-04-01 DIAGNOSIS — E8779 Other fluid overload: Secondary | ICD-10-CM | POA: Diagnosis not present

## 2023-04-01 DIAGNOSIS — I2510 Atherosclerotic heart disease of native coronary artery without angina pectoris: Secondary | ICD-10-CM | POA: Diagnosis not present

## 2023-04-01 DIAGNOSIS — G319 Degenerative disease of nervous system, unspecified: Secondary | ICD-10-CM | POA: Diagnosis not present

## 2023-04-01 DIAGNOSIS — K7689 Other specified diseases of liver: Secondary | ICD-10-CM | POA: Diagnosis not present

## 2023-04-01 DIAGNOSIS — I5030 Unspecified diastolic (congestive) heart failure: Secondary | ICD-10-CM | POA: Insufficient documentation

## 2023-04-01 DIAGNOSIS — J9622 Acute and chronic respiratory failure with hypercapnia: Secondary | ICD-10-CM | POA: Diagnosis present

## 2023-04-01 DIAGNOSIS — E669 Obesity, unspecified: Secondary | ICD-10-CM | POA: Diagnosis present

## 2023-04-01 DIAGNOSIS — R918 Other nonspecific abnormal finding of lung field: Secondary | ICD-10-CM | POA: Diagnosis not present

## 2023-04-01 DIAGNOSIS — R1310 Dysphagia, unspecified: Secondary | ICD-10-CM | POA: Diagnosis present

## 2023-04-01 DIAGNOSIS — Z66 Do not resuscitate: Secondary | ICD-10-CM | POA: Diagnosis present

## 2023-04-01 DIAGNOSIS — I5082 Biventricular heart failure: Secondary | ICD-10-CM | POA: Diagnosis not present

## 2023-04-01 DIAGNOSIS — Z7401 Bed confinement status: Secondary | ICD-10-CM

## 2023-04-01 DIAGNOSIS — L8990 Pressure ulcer of unspecified site, unspecified stage: Secondary | ICD-10-CM | POA: Insufficient documentation

## 2023-04-01 DIAGNOSIS — Z9911 Dependence on respirator [ventilator] status: Secondary | ICD-10-CM

## 2023-04-01 DIAGNOSIS — A4159 Other Gram-negative sepsis: Secondary | ICD-10-CM | POA: Diagnosis not present

## 2023-04-01 DIAGNOSIS — Z881 Allergy status to other antibiotic agents status: Secondary | ICD-10-CM

## 2023-04-01 DIAGNOSIS — J982 Interstitial emphysema: Secondary | ICD-10-CM | POA: Diagnosis not present

## 2023-04-01 DIAGNOSIS — J440 Chronic obstructive pulmonary disease with acute lower respiratory infection: Secondary | ICD-10-CM | POA: Diagnosis not present

## 2023-04-01 DIAGNOSIS — D689 Coagulation defect, unspecified: Secondary | ICD-10-CM | POA: Diagnosis not present

## 2023-04-01 DIAGNOSIS — Z7951 Long term (current) use of inhaled steroids: Secondary | ICD-10-CM

## 2023-04-01 DIAGNOSIS — D6489 Other specified anemias: Secondary | ICD-10-CM | POA: Diagnosis present

## 2023-04-01 DIAGNOSIS — G9341 Metabolic encephalopathy: Secondary | ICD-10-CM | POA: Diagnosis present

## 2023-04-01 DIAGNOSIS — K6389 Other specified diseases of intestine: Secondary | ICD-10-CM | POA: Diagnosis not present

## 2023-04-01 DIAGNOSIS — N19 Unspecified kidney failure: Secondary | ICD-10-CM | POA: Diagnosis not present

## 2023-04-01 DIAGNOSIS — Z90710 Acquired absence of both cervix and uterus: Secondary | ICD-10-CM

## 2023-04-01 DIAGNOSIS — T8249XA Other complication of vascular dialysis catheter, initial encounter: Secondary | ICD-10-CM | POA: Diagnosis not present

## 2023-04-01 DIAGNOSIS — R29818 Other symptoms and signs involving the nervous system: Secondary | ICD-10-CM | POA: Diagnosis not present

## 2023-04-01 DIAGNOSIS — D692 Other nonthrombocytopenic purpura: Secondary | ICD-10-CM | POA: Diagnosis not present

## 2023-04-01 DIAGNOSIS — R7989 Other specified abnormal findings of blood chemistry: Secondary | ICD-10-CM

## 2023-04-01 DIAGNOSIS — E440 Moderate protein-calorie malnutrition: Secondary | ICD-10-CM | POA: Diagnosis present

## 2023-04-01 DIAGNOSIS — J449 Chronic obstructive pulmonary disease, unspecified: Secondary | ICD-10-CM | POA: Diagnosis not present

## 2023-04-01 DIAGNOSIS — N179 Acute kidney failure, unspecified: Secondary | ICD-10-CM | POA: Diagnosis not present

## 2023-04-01 DIAGNOSIS — Z79899 Other long term (current) drug therapy: Secondary | ICD-10-CM | POA: Diagnosis not present

## 2023-04-01 DIAGNOSIS — J810 Acute pulmonary edema: Secondary | ICD-10-CM | POA: Diagnosis not present

## 2023-04-01 DIAGNOSIS — D649 Anemia, unspecified: Secondary | ICD-10-CM | POA: Diagnosis not present

## 2023-04-01 DIAGNOSIS — N186 End stage renal disease: Secondary | ICD-10-CM | POA: Diagnosis not present

## 2023-04-01 DIAGNOSIS — Z452 Encounter for adjustment and management of vascular access device: Secondary | ICD-10-CM | POA: Diagnosis not present

## 2023-04-01 DIAGNOSIS — I952 Hypotension due to drugs: Secondary | ICD-10-CM | POA: Diagnosis not present

## 2023-04-01 DIAGNOSIS — Y95 Nosocomial condition: Secondary | ICD-10-CM | POA: Diagnosis present

## 2023-04-01 DIAGNOSIS — E785 Hyperlipidemia, unspecified: Secondary | ICD-10-CM | POA: Diagnosis present

## 2023-04-01 DIAGNOSIS — D696 Thrombocytopenia, unspecified: Secondary | ICD-10-CM

## 2023-04-01 DIAGNOSIS — T4275XA Adverse effect of unspecified antiepileptic and sedative-hypnotic drugs, initial encounter: Secondary | ICD-10-CM | POA: Diagnosis not present

## 2023-04-01 DIAGNOSIS — K560 Paralytic ileus: Secondary | ICD-10-CM | POA: Diagnosis present

## 2023-04-01 DIAGNOSIS — L89321 Pressure ulcer of left buttock, stage 1: Secondary | ICD-10-CM | POA: Diagnosis present

## 2023-04-01 DIAGNOSIS — K5900 Constipation, unspecified: Secondary | ICD-10-CM | POA: Diagnosis not present

## 2023-04-01 DIAGNOSIS — J189 Pneumonia, unspecified organism: Secondary | ICD-10-CM

## 2023-04-01 DIAGNOSIS — K567 Ileus, unspecified: Secondary | ICD-10-CM | POA: Diagnosis present

## 2023-04-01 DIAGNOSIS — F419 Anxiety disorder, unspecified: Secondary | ICD-10-CM | POA: Insufficient documentation

## 2023-04-01 DIAGNOSIS — J90 Pleural effusion, not elsewhere classified: Secondary | ICD-10-CM | POA: Diagnosis not present

## 2023-04-01 DIAGNOSIS — G629 Polyneuropathy, unspecified: Secondary | ICD-10-CM | POA: Diagnosis present

## 2023-04-01 DIAGNOSIS — E162 Hypoglycemia, unspecified: Secondary | ICD-10-CM | POA: Diagnosis present

## 2023-04-01 DIAGNOSIS — I5031 Acute diastolic (congestive) heart failure: Secondary | ICD-10-CM

## 2023-04-01 DIAGNOSIS — D6959 Other secondary thrombocytopenia: Secondary | ICD-10-CM | POA: Diagnosis not present

## 2023-04-01 DIAGNOSIS — Z992 Dependence on renal dialysis: Secondary | ICD-10-CM | POA: Diagnosis not present

## 2023-04-01 DIAGNOSIS — N170 Acute kidney failure with tubular necrosis: Secondary | ICD-10-CM | POA: Diagnosis present

## 2023-04-01 DIAGNOSIS — J9690 Respiratory failure, unspecified, unspecified whether with hypoxia or hypercapnia: Secondary | ICD-10-CM | POA: Diagnosis not present

## 2023-04-01 DIAGNOSIS — R739 Hyperglycemia, unspecified: Secondary | ICD-10-CM | POA: Diagnosis not present

## 2023-04-01 DIAGNOSIS — R6521 Severe sepsis with septic shock: Secondary | ICD-10-CM | POA: Diagnosis present

## 2023-04-01 DIAGNOSIS — R569 Unspecified convulsions: Secondary | ICD-10-CM | POA: Diagnosis not present

## 2023-04-01 DIAGNOSIS — F1320 Sedative, hypnotic or anxiolytic dependence, uncomplicated: Secondary | ICD-10-CM | POA: Diagnosis present

## 2023-04-01 DIAGNOSIS — E870 Hyperosmolality and hypernatremia: Secondary | ICD-10-CM | POA: Diagnosis not present

## 2023-04-01 DIAGNOSIS — R140 Abdominal distension (gaseous): Secondary | ICD-10-CM | POA: Diagnosis not present

## 2023-04-01 DIAGNOSIS — I700 Atherosclerosis of aorta: Secondary | ICD-10-CM | POA: Diagnosis not present

## 2023-04-01 DIAGNOSIS — E8770 Fluid overload, unspecified: Secondary | ICD-10-CM

## 2023-04-01 DIAGNOSIS — K310 Acute dilatation of stomach: Secondary | ICD-10-CM | POA: Diagnosis not present

## 2023-04-01 DIAGNOSIS — R601 Generalized edema: Secondary | ICD-10-CM | POA: Diagnosis not present

## 2023-04-01 LAB — GLUCOSE, CAPILLARY
Glucose-Capillary: 83 mg/dL (ref 70–99)
Glucose-Capillary: 113 mg/dL — ABNORMAL HIGH (ref 70–99)
Glucose-Capillary: 104 mg/dL — ABNORMAL HIGH (ref 70–99)

## 2023-04-01 LAB — URINALYSIS, ROUTINE W REFLEX MICROSCOPIC
Bilirubin Urine: NEGATIVE
Glucose, UA: NEGATIVE mg/dL
Ketones, ur: 5 mg/dL — AB
Nitrite: NEGATIVE
Protein, ur: 100 mg/dL — AB
Specific Gravity, Urine: 1.015 (ref 1.005–1.030)
pH: 5 (ref 5.0–8.0)

## 2023-04-01 LAB — COMPREHENSIVE METABOLIC PANEL
ALT: 17 U/L (ref 0–44)
AST: 14 U/L — ABNORMAL LOW (ref 15–41)
Albumin: 2.2 g/dL — ABNORMAL LOW (ref 3.5–5.0)
Alkaline Phosphatase: 46 U/L (ref 38–126)
Anion gap: 8 (ref 5–15)
BUN: 53 mg/dL — ABNORMAL HIGH (ref 8–23)
CO2: 24 mmol/L (ref 22–32)
Calcium: 8 mg/dL — ABNORMAL LOW (ref 8.9–10.3)
Chloride: 111 mmol/L (ref 98–111)
Creatinine, Ser: 2.26 mg/dL — ABNORMAL HIGH (ref 0.44–1.00)
GFR, Estimated: 24 mL/min — ABNORMAL LOW (ref >60)
Glucose, Bld: 93 mg/dL (ref 70–99)
Potassium: 3.5 mmol/L (ref 3.5–5.1)
Sodium: 143 mmol/L (ref 135–145)
Total Bilirubin: 0.8 mg/dL (ref 0.3–1.2)
Total Protein: 5.1 g/dL — ABNORMAL LOW (ref 6.5–8.1)

## 2023-04-01 LAB — BRAIN NATRIURETIC PEPTIDE: B Natriuretic Peptide: 102.6 pg/mL — ABNORMAL HIGH (ref 0.0–100.0)

## 2023-04-01 LAB — BLOOD GAS, ARTERIAL
Acid-base deficit: 4.1 mmol/L — ABNORMAL HIGH (ref 0.0–2.0)
Bicarbonate: 23.3 mmol/L (ref 20.0–28.0)
Drawn by: 33147
FIO2: 35 %
MECHVT: 44 mL
O2 Saturation: 100 %
PEEP: 5 cmH2O
Patient temperature: 37
RATE: 20 resp/min
pCO2 arterial: 52 mmHg — ABNORMAL HIGH (ref 32–48)
pH, Arterial: 7.26 — ABNORMAL LOW (ref 7.35–7.45)
pO2, Arterial: 106 mmHg (ref 83–108)

## 2023-04-01 LAB — CBC
HCT: 36.1 % (ref 36.0–46.0)
Hemoglobin: 11.1 g/dL — ABNORMAL LOW (ref 12.0–15.0)
MCH: 30.7 pg (ref 26.0–34.0)
MCHC: 30.7 g/dL (ref 30.0–36.0)
MCV: 100 fL (ref 80.0–100.0)
Platelets: 116 10*3/uL — ABNORMAL LOW (ref 150–400)
RBC: 3.61 MIL/uL — ABNORMAL LOW (ref 3.87–5.11)
RDW: 14.8 % (ref 11.5–15.5)
WBC: 20.4 10*3/uL — ABNORMAL HIGH (ref 4.0–10.5)
nRBC: 0 % (ref 0.0–0.2)

## 2023-04-01 LAB — MRSA NEXT GEN BY PCR, NASAL: MRSA by PCR Next Gen: NOT DETECTED

## 2023-04-01 LAB — PROCALCITONIN: Procalcitonin: 0.56 ng/mL

## 2023-04-01 LAB — PHOSPHORUS: Phosphorus: 3.3 mg/dL (ref 2.5–4.6)

## 2023-04-01 LAB — MAGNESIUM: Magnesium: 2.1 mg/dL (ref 1.7–2.4)

## 2023-04-01 MED ORDER — ARFORMOTEROL TARTRATE 15 MCG/2ML IN NEBU
15.0000 ug | INHALATION_SOLUTION | Freq: Two times a day (BID) | RESPIRATORY_TRACT | Status: DC
  Administered 2023-04-01 – 2023-04-28 (×54): 15 ug via RESPIRATORY_TRACT
  Filled 2023-04-01 (×55): qty 2

## 2023-04-01 MED ORDER — ORAL CARE MOUTH RINSE: 15.0000 mL | OROMUCOSAL | Status: DC | PRN

## 2023-04-01 MED ORDER — ORAL CARE MOUTH RINSE
15.0000 mL | OROMUCOSAL | Status: DC
  Administered 2023-04-01 – 2023-04-28 (×308): 15 mL via OROMUCOSAL

## 2023-04-01 MED ORDER — FENTANYL BOLUS VIA INFUSION
50.0000 ug | INTRAVENOUS | Status: DC | PRN
  Administered 2023-04-01: 75 ug via INTRAVENOUS
  Administered 2023-04-02 (×2): 100 ug via INTRAVENOUS

## 2023-04-01 MED ORDER — REVEFENACIN 175 MCG/3ML IN SOLN
175.0000 ug | Freq: Every day | RESPIRATORY_TRACT | Status: DC
  Administered 2023-04-01 – 2023-04-28 (×28): 175 ug via RESPIRATORY_TRACT
  Filled 2023-04-01 (×28): qty 3

## 2023-04-01 MED ORDER — BUDESONIDE 0.5 MG/2ML IN SUSP
0.5000 mg | Freq: Two times a day (BID) | RESPIRATORY_TRACT | Status: DC
  Administered 2023-04-01 – 2023-04-28 (×54): 0.5 mg via RESPIRATORY_TRACT
  Filled 2023-04-01 (×55): qty 2

## 2023-04-01 MED ORDER — SODIUM CHLORIDE 0.9 % IV SOLN: INTRAVENOUS | Status: DC

## 2023-04-01 MED ORDER — ACETAMINOPHEN 160 MG/5ML PO SOLN
650.0000 mg | ORAL | Status: DC | PRN
  Administered 2023-04-15: 650 mg
  Filled 2023-04-01 (×2): qty 20.3

## 2023-04-01 MED ORDER — IPRATROPIUM-ALBUTEROL 0.5-2.5 (3) MG/3ML IN SOLN: 3.0000 mL | RESPIRATORY_TRACT | Status: DC

## 2023-04-01 MED ORDER — ALBUTEROL SULFATE (2.5 MG/3ML) 0.083% IN NEBU
2.5000 mg | INHALATION_SOLUTION | RESPIRATORY_TRACT | Status: DC | PRN
  Administered 2023-04-15: 2.5 mg via RESPIRATORY_TRACT
  Filled 2023-04-01: qty 3

## 2023-04-01 MED ORDER — ORAL CARE MOUTH RINSE: 15.0000 mL | OROMUCOSAL | Status: DC

## 2023-04-01 MED ORDER — POLYETHYLENE GLYCOL 3350 17 G PO PACK: 17.0000 g | PACK | Freq: Every day | ORAL | Status: DC | PRN

## 2023-04-01 MED ORDER — FENTANYL 2500MCG IN NS 250ML (10MCG/ML) PREMIX INFUSION
50.0000 ug/h | INTRAVENOUS | Status: DC
  Administered 2023-04-01 (×2): 75 ug/h via INTRAVENOUS
  Administered 2023-04-02: 50 ug/h via INTRAVENOUS
  Filled 2023-04-01 (×2): qty 250

## 2023-04-01 MED ORDER — POTASSIUM CHLORIDE 10 MEQ/50ML IV SOLN: 10.0000 meq | INTRAVENOUS | Status: DC

## 2023-04-01 MED ORDER — DOCUSATE SODIUM 50 MG/5ML PO LIQD
100.0000 mg | Freq: Two times a day (BID) | ORAL | Status: DC
  Administered 2023-04-02 – 2023-04-22 (×29): 100 mg
  Filled 2023-04-01 (×33): qty 10

## 2023-04-01 MED ORDER — FAMOTIDINE 20 MG PO TABS
20.0000 mg | ORAL_TABLET | Freq: Two times a day (BID) | ORAL | Status: DC
  Administered 2023-04-02: 20 mg
  Filled 2023-04-01: qty 1

## 2023-04-01 MED ORDER — DOCUSATE SODIUM 100 MG PO CAPS: 100.0000 mg | ORAL_CAPSULE | Freq: Two times a day (BID) | ORAL | Status: DC | PRN

## 2023-04-01 MED ORDER — POTASSIUM CHLORIDE 10 MEQ/50ML IV SOLN
10.0000 meq | INTRAVENOUS | Status: AC
  Administered 2023-04-01 (×2): 10 meq via INTRAVENOUS
  Filled 2023-04-01 (×2): qty 50

## 2023-04-01 MED ORDER — DEXMEDETOMIDINE HCL IN NACL 200 MCG/50ML IV SOLN
0.0000 ug/kg/h | INTRAVENOUS | Status: DC
  Administered 2023-04-01: 1.2 ug/kg/h via INTRAVENOUS
  Administered 2023-04-01: 0.4 ug/kg/h via INTRAVENOUS
  Administered 2023-04-01: 1.2 ug/kg/h via INTRAVENOUS
  Administered 2023-04-02: 0.4 ug/kg/h via INTRAVENOUS
  Administered 2023-04-02: 0.5 ug/kg/h via INTRAVENOUS
  Administered 2023-04-02: 0.6 ug/kg/h via INTRAVENOUS
  Administered 2023-04-02: 0.5 ug/kg/h via INTRAVENOUS
  Administered 2023-04-03: 0.8 ug/kg/h via INTRAVENOUS
  Administered 2023-04-03: 0.6 ug/kg/h via INTRAVENOUS
  Administered 2023-04-03: 0.7 ug/kg/h via INTRAVENOUS
  Administered 2023-04-03: 0.4 ug/kg/h via INTRAVENOUS
  Administered 2023-04-03 (×2): 0.6 ug/kg/h via INTRAVENOUS
  Administered 2023-04-04 (×4): 0.8 ug/kg/h via INTRAVENOUS
  Administered 2023-04-04: 0.9 ug/kg/h via INTRAVENOUS
  Administered 2023-04-04: 0.4 ug/kg/h via INTRAVENOUS
  Filled 2023-04-01 (×15): qty 50
  Filled 2023-04-01: qty 100
  Filled 2023-04-01: qty 50

## 2023-04-01 MED ORDER — BISACODYL 10 MG RE SUPP: 10.0000 mg | Freq: Every day | RECTAL | Status: DC | PRN

## 2023-04-01 MED ORDER — POLYETHYLENE GLYCOL 3350 17 G PO PACK
17.0000 g | PACK | Freq: Two times a day (BID) | ORAL | Status: DC
  Administered 2023-04-02: 17 g
  Filled 2023-04-01: qty 1

## 2023-04-01 MED ORDER — FUROSEMIDE 10 MG/ML IJ SOLN
60.0000 mg | Freq: Four times a day (QID) | INTRAMUSCULAR | Status: AC
  Administered 2023-04-01 – 2023-04-02 (×3): 60 mg via INTRAVENOUS
  Filled 2023-04-01 (×3): qty 6

## 2023-04-01 MED ORDER — INSULIN ASPART 100 UNIT/ML IJ SOLN
0.0000 [IU] | INTRAMUSCULAR | Status: DC
  Administered 2023-04-05 – 2023-04-08 (×3): 1 [IU] via SUBCUTANEOUS

## 2023-04-01 MED ORDER — ENOXAPARIN SODIUM 30 MG/0.3ML IJ SOSY
30.0000 mg | PREFILLED_SYRINGE | INTRAMUSCULAR | Status: DC
  Administered 2023-04-01 – 2023-04-03 (×3): 30 mg via SUBCUTANEOUS
  Filled 2023-04-01 (×3): qty 0.3

## 2023-04-01 NOTE — H&P (Signed)
NAME:  Tamara Castro, MRN:  [id number], DOB:  11-30-1960, LOS: 0 ADMISSION DATE:  04/01/2023, CONSULTATION DATE:  04/01/23 REFERRING MD:  Salvadore Dom, CHIEF COMPLAINT:  resp failure   History of Present Illness:  HPI obtained from EMR review as patient is intubated and sedated on MV.   67 yoF with PMH as below who presented to [facility] on 7/5 after 1 week hx of URI symptoms admitted for acute hypoxic and hypercarbic respiratory failure 2/2 RLL PNA requiring intubation, septic shock, AKI and acute heart failure.  Found to have pBNP of 49k with elevated troponin's thought secondary to demand ischemia with echo with normal EF, reduced RV, indeterminate diastolic function.  CTA neg for PE 7/5.  CTH neg 7/9.  Covered with azithromycin/ ceftriaxone, switched to cefepime on 7/7.  Shock and AKI improved and extubated 7/7 but required BiPAP.  Required reintubation 7/10 for respiratory failure.  Increasing WBC 7/13, placed on doxycyline switched to vancomycin with elevated PCT with CXR showing increased bibasilar atelectasis vs infiltrates with increasing pleural effusions.  Initial blood culture x1 w/ stap epi, thought contaminate with repeat BC 7/7 negative.  Some concern for ileus with high OGT residuals with CT a/p showing slightly worse dilation of suspected chronic ileus with prior surgical changes but no transition point, possible emesis of TF since held, remains, NPO on reglan.  Patient has failed SBT for several days, due to apnea despite decreased sedation.  Transferred to [facility] ICU for further care, admitted to PCCM.   Pertinent  Medical History  Tobacco abuse,  COPD, chronic constipation, prior colectomy in 1990's 2/2 diverticulitis, fibromyalgia (on disability since 2008), depression, anxiety, neuropathy  Home meds> xanax (1mg  QID), lexapro, gabapentin, anoro elliptia, zofran  Significant Hospital Events: Including procedures, antibiotic start and stop dates in addition to other pertinent events   7/5  admitted [facility] 7/7 extubated 7/10 reintubated  7/16 tx to [facility]  7/5-7/7 ctx/ azitro 7/7 vanc> 7/8; 7/14- 7/15 7/7 cefepime> 7/16 7/13 doxy> 7/14  Interim History / Subjective:  Arrived on fentanyl and propofol gtt, LR 156ml/hr  Objective   Blood pressure (!) 151/80, pulse 84, temperature 98.3 F (36.8 C), temperature source Oral, resp. rate 20, height 5\' 5"  (1.651 m), weight 91.8 kg, SpO2 98%.    Vent Mode: PRVC FiO2 (%):  [35 %] 35 % Set Rate:  [20 bmp] 20 bmp Vt Set:  [440 mL] 440 mL PEEP:  [5 cmH20] 5 cmH20 Plateau Pressure:  [27 cmH20] 27 cmH20  No intake or output data in the 24 hours ending 04/01/23 1732 Filed Weights   04/01/23 1529  Weight: 91.8 kg   Examination: General:  critically ill adult female in NAD sedated on MV HEENT: MM pink/moist, copious oral creamy white secretions, ETT w/ bite block, OGT, pupils 3/r, anicteric  Neuro: opens eyes to verbal briefly, follows few simple commands/ MAE CV:  rr, NSR, no murmur, L internal jugular CVL, site wnl PULM:  MV supported, diminished, faint exp wheeze, suctioning deferred to obtain culture GI: obese soft, bs+, NT, foley- cyu Extremities: warm/dry, 3+ edema in extremities, weeping in left arm Skin: ecchymosis to BUE  OSH labs reviewed> Na 149, K 3.5, BUN 51, sCr 1.95, WBC 18.1, H/H 10.5/ 32, plts 106  Resolved Hospital Problem list    Assessment & Plan:   Acute hypoxic and hypercarbic respiratory failure 2/2 AECOPD, Moraxella PNA, pleural effusions, and r/o HCAP vs new aspiration component Tobacco abuse - OSH urine legionella, strep, SARS neg P:  -  cont full MV support, 4-8cc/kg IBW with goal Pplat <30 and DP<15  - VAP prevention protocol/ PPI - PAD protocol for sedation> change to precedex/ minimize fentanyl gtt - CXR/ ABG now - possible new aspiration component w/ hx of TF's coming out of nare - diurese as renal/ hemodynamically tolerated  - send trach asp, check MRSA PCR - wean FiO2 as able for SpO2  >90% - daily SAT & SBT  - pulmonary hygiene - duonebs q4, brovanna, pulmicort, prn albuterol - smoking cessation when appropriate, nicotine patch prn   Sepsis - increasing leukocytosis since 7/13 with elevated PCT but reportedly afebrile.   - 7/5 BC x1 w/ stap epi thought contaminate with repeat BC 7/7 neg P:  - CBC, CMET, PCT now - ideally remove CVL but given edema may not be able to get PIVs - s/p 10 days of cefepime, 3 days vanc. Hold further abx for now - trend PCT, send trach asp and UA - trend CBC/ fever curve    Moderate RV dysfunction Acute Heart failure, new onset CAD Elevated troponin's thought secondary to demand ischemia Prolonged QTc - Echo moderately reduced RV, normal EF, indeterminate DD, did not comment on  P:  - EKG now - cont with ASA/ statin - repeat Echo - diurese as tolerated, check CVP - optimize electrolytes  - OSH CT showing evidence of 3 vessel CAD.  Will need further cardiology workup when critical illness resolved.    AKI  Hypernatremia - volume overloaded on exam and HTN> stop MIVF, lasix 60mg  x 1, monitor Na closely  - cont foley, check UA - BMET now - trend renal indices  - strict I/Os, daily wts - avoid nephrotoxins, renal dose meds, hemodynamic support as above   Constipation vs chronic ileus - OSH CT a/p 7/11 chronic dilation of small bowel, likely 2/2 colectomy hx - reports of tube feeds coming out of nares pta - KUB, OGT to LIWS - keep NPO for now - hold reglan for now pending KUB/ Qtc.  May need surgery consult   Normocytic anemia Thrombocytopenia - ? Secondary to sepsis/ critical illness  - trend CBC - transfuse for Hgb < 7   Anxiety/ depression- benzo dependent - holding lexapro and xanax while NPO  Best Practice (right click and "Reselect all SmartList Selections" daily)   Diet/type: NPO DVT prophylaxis: systemic heparin GI prophylaxis: PPI Lines: Central line Foley:  Yes, and it is still needed Code Status:   full code Last date of multidisciplinary goals of care discussion [pending]  Daughter [name] [phone] pending  Labs   CBC: Recent Labs  Lab 04/01/23 1602  WBC 20.4*  HGB 11.1*  HCT 36.1  MCV 100.0  PLT 116*    Basic Metabolic Panel: No results for input(s): "NA", "K", "CL", "CO2", "GLUCOSE", "BUN", "CREATININE", "CALCIUM", "MG", "PHOS" in the last 168 hours. GFR: CrCl cannot be calculated (Patient's most recent lab result is older than the maximum 21 days allowed.). Recent Labs  Lab 04/01/23 1602  WBC 20.4*    Liver Function Tests: No results for input(s): "AST", "ALT", "ALKPHOS", "BILITOT", "PROT", "ALBUMIN" in the last 168 hours. No results for input(s): "LIPASE", "AMYLASE" in the last 168 hours. No results for input(s): "AMMONIA" in the last 168 hours.  ABG    Component Value Date/Time   PHART 7.26 (L) 04/01/2023 1630   PCO2ART 52 (H) 04/01/2023 1630   PO2ART 106 04/01/2023 1630   HCO3 23.3 04/01/2023 1630   ACIDBASEDEF 4.1 (H)  04/01/2023 1630   O2SAT 100 04/01/2023 1630     Coagulation Profile: No results for input(s): "INR", "PROTIME" in the last 168 hours.  Cardiac Enzymes: No results for input(s): "CKTOTAL", "CKMB", "CKMBINDEX", "TROPONINI" in the last 168 hours.  HbA1C: No results found for: "HGBA1C"  CBG: Recent Labs  Lab 04/01/23 1540  GLUCAP 83    Review of Systems:   unable  Past Medical History:  She,  has a past medical history of Anxiety, Asthma, Bladder polyps, COPD (chronic obstructive pulmonary disease) (HCC), Dysplasia of cervix, Hyperlipidemia, and Osteoarthritis.   Surgical History:   Past Surgical History:  Procedure Laterality Date   ABDOMINAL HYSTERECTOMY     CHOLECYSTECTOMY     TONSILLECTOMY Bilateral    TOTAL COLECTOMY  1998     Social History:   reports that she has been smoking cigarettes. She has a 40 pack-year smoking history. She has never used smokeless tobacco. She reports that she does not  currently use alcohol. She reports that she does not currently use drugs.   Family History:  Her family history is not on file.   Allergies Allergies  Allergen Reactions   Ciprofloxacin Diarrhea    Per pt, this medication gave her c-diff     Home Medications  Prior to Admission medications   Medication Sig Start Date End Date Taking? Authorizing Provider  albuterol (ACCUNEB) 0.63 MG/3ML nebulizer solution Take by nebulization. 09/17/21   [provider]  ALPRAZolam Prudy Feeler) 1 MG tablet Take 1 mg by mouth 4 (four) times daily. 03/15/21   [provider]  ANORO ELLIPTA 62.5-25 MCG/INH AEPB Inhale 1 puff into the lungs daily. 03/13/21   [provider]  diphenhydrAMINE HCl, Sleep, (ZZZQUIL PO) Take 2 tablets by mouth at bedtime. Pt takes 2 gummies every night    [provider]  escitalopram (LEXAPRO) 10 MG tablet Take 10 mg by mouth 2 (two) times daily. 12/16/20   [provider]  ezetimibe (ZETIA) 10 MG tablet SMARTSIG:1 Tablet(s) By Mouth Every Evening 03/07/21   [provider]  gabapentin (NEURONTIN) 400 MG capsule Take 800 mg by mouth 3 (three) times daily as needed. 03/13/21   [provider]  PROAIR HFA 108 (90 Base) MCG/ACT inhaler Inhale 2 puffs into the lungs 4 (four) times daily. 03/13/21   [provider]     Critical care time: 50 mins       Posey Boyer, MSN, NP, AG-ACNP-BC [facility] 04/01/2023, 5:32 PM  See Amion for pager If no response to pager , please call [phone] until 7pm After 7:00 pm call Elink  [phone]

## 2023-04-01 NOTE — Progress Notes (Signed)
Pt received from [facility] on vent. Pt placed on current vent setting with no complications.

## 2023-04-02 ENCOUNTER — Inpatient Hospital Stay (HOSPITAL_COMMUNITY): Payer: 59

## 2023-04-02 DIAGNOSIS — I5082 Biventricular heart failure: Secondary | ICD-10-CM | POA: Diagnosis not present

## 2023-04-02 DIAGNOSIS — N179 Acute kidney failure, unspecified: Secondary | ICD-10-CM | POA: Diagnosis not present

## 2023-04-02 DIAGNOSIS — K567 Ileus, unspecified: Secondary | ICD-10-CM

## 2023-04-02 DIAGNOSIS — J9602 Acute respiratory failure with hypercapnia: Secondary | ICD-10-CM | POA: Diagnosis not present

## 2023-04-02 DIAGNOSIS — J9601 Acute respiratory failure with hypoxia: Secondary | ICD-10-CM | POA: Diagnosis not present

## 2023-04-02 LAB — HIV ANTIBODY (ROUTINE TESTING W REFLEX): HIV Screen 4th Generation wRfx: NONREACTIVE

## 2023-04-02 LAB — GLUCOSE, CAPILLARY
Glucose-Capillary: 91 mg/dL (ref 70–99)
Glucose-Capillary: 73 mg/dL (ref 70–99)
Glucose-Capillary: 83 mg/dL (ref 70–99)
Glucose-Capillary: 85 mg/dL (ref 70–99)
Glucose-Capillary: 96 mg/dL (ref 70–99)
Glucose-Capillary: 98 mg/dL (ref 70–99)

## 2023-04-02 LAB — CBC
HCT: 35 % — ABNORMAL LOW (ref 36.0–46.0)
Hemoglobin: 10.9 g/dL — ABNORMAL LOW (ref 12.0–15.0)
MCH: 31.1 pg (ref 26.0–34.0)
MCHC: 31.1 g/dL (ref 30.0–36.0)
MCV: 99.7 fL (ref 80.0–100.0)
Platelets: 113 10*3/uL — ABNORMAL LOW (ref 150–400)
RBC: 3.51 MIL/uL — ABNORMAL LOW (ref 3.87–5.11)
RDW: 14.8 % (ref 11.5–15.5)
WBC: 19.3 10*3/uL — ABNORMAL HIGH (ref 4.0–10.5)
nRBC: 0 % (ref 0.0–0.2)

## 2023-04-02 LAB — BASIC METABOLIC PANEL
Anion gap: 8 (ref 5–15)
BUN: 59 mg/dL — ABNORMAL HIGH (ref 8–23)
CO2: 21 mmol/L — ABNORMAL LOW (ref 22–32)
Calcium: 7.9 mg/dL — ABNORMAL LOW (ref 8.9–10.3)
Chloride: 112 mmol/L — ABNORMAL HIGH (ref 98–111)
Creatinine, Ser: 2.77 mg/dL — ABNORMAL HIGH (ref 0.44–1.00)
GFR, Estimated: 19 mL/min — ABNORMAL LOW (ref >60)
Glucose, Bld: 89 mg/dL (ref 70–99)
Potassium: 4 mmol/L (ref 3.5–5.1)
Sodium: 141 mmol/L (ref 135–145)

## 2023-04-02 LAB — ECHOCARDIOGRAM COMPLETE
Area-P 1/2: 4.06 cm2
Height: 65 in
S' Lateral: 3.9 cm
Weight: 3255.75 oz

## 2023-04-02 LAB — HEMOGLOBIN AND HEMATOCRIT, BLOOD
HCT: 30.4 % — ABNORMAL LOW (ref 36.0–46.0)
Hemoglobin: 9.5 g/dL — ABNORMAL LOW (ref 12.0–15.0)

## 2023-04-02 LAB — MAGNESIUM: Magnesium: 2 mg/dL (ref 1.7–2.4)

## 2023-04-02 LAB — PROCALCITONIN: Procalcitonin: 0.6 ng/mL

## 2023-04-02 MED ORDER — ALBUMIN HUMAN 25 % IV SOLN
25.0000 g | Freq: Four times a day (QID) | INTRAVENOUS | Status: AC
  Administered 2023-04-02 (×2): 25 g via INTRAVENOUS
  Filled 2023-04-02 (×2): qty 100

## 2023-04-02 MED ORDER — CHLORHEXIDINE GLUCONATE CLOTH 2 % EX PADS
6.0000 | MEDICATED_PAD | Freq: Every day | CUTANEOUS | Status: DC
  Administered 2023-04-03 – 2023-04-04 (×3): 6 via TOPICAL

## 2023-04-02 MED ORDER — SODIUM CHLORIDE 0.9% FLUSH
10.0000 mL | Freq: Two times a day (BID) | INTRAVENOUS | Status: DC
  Administered 2023-04-02: 30 mL
  Administered 2023-04-02 – 2023-04-03 (×3): 10 mL
  Administered 2023-04-04 (×2): 20 mL
  Administered 2023-04-05 – 2023-04-07 (×5): 10 mL
  Administered 2023-04-07: 20 mL
  Administered 2023-04-08: 10 mL
  Administered 2023-04-08: 20 mL
  Administered 2023-04-09 (×2): 10 mL
  Administered 2023-04-10: 20 mL
  Administered 2023-04-10 – 2023-04-14 (×8): 10 mL
  Administered 2023-04-14: 20 mL
  Administered 2023-04-15: 10 mL
  Administered 2023-04-15: 20 mL
  Administered 2023-04-16 (×2): 40 mL
  Administered 2023-04-17 – 2023-04-18 (×4): 10 mL
  Administered 2023-04-19: 30 mL
  Administered 2023-04-19 – 2023-04-28 (×17): 10 mL

## 2023-04-02 MED ORDER — METOCLOPRAMIDE HCL 5 MG/ML IJ SOLN
10.0000 mg | Freq: Four times a day (QID) | INTRAMUSCULAR | Status: DC
  Administered 2023-04-02 – 2023-04-05 (×11): 10 mg via INTRAVENOUS
  Filled 2023-04-02 (×11): qty 2

## 2023-04-02 MED ORDER — PANTOPRAZOLE SODIUM 40 MG IV SOLR
40.0000 mg | Freq: Two times a day (BID) | INTRAVENOUS | Status: DC
  Administered 2023-04-02 – 2023-04-12 (×21): 40 mg via INTRAVENOUS
  Filled 2023-04-02 (×21): qty 10

## 2023-04-02 MED ORDER — SODIUM CHLORIDE 0.9% FLUSH
10.0000 mL | INTRAVENOUS | Status: DC | PRN
  Administered 2023-04-06: 10 mL

## 2023-04-02 MED ORDER — PANTOPRAZOLE SODIUM 40 MG IV SOLR
40.0000 mg | INTRAVENOUS | Status: DC
  Administered 2023-04-02: 40 mg via INTRAVENOUS
  Filled 2023-04-02: qty 10

## 2023-04-02 MED ORDER — FUROSEMIDE 10 MG/ML IJ SOLN
80.0000 mg | Freq: Four times a day (QID) | INTRAMUSCULAR | Status: AC
  Administered 2023-04-02 (×2): 80 mg via INTRAVENOUS
  Filled 2023-04-02 (×2): qty 8

## 2023-04-02 MED ORDER — IOHEXOL 9 MG/ML PO SOLN
ORAL | Status: AC
  Filled 2023-04-02: qty 1000

## 2023-04-02 MED ORDER — FAMOTIDINE 20 MG PO TABS: 20.0000 mg | ORAL_TABLET | Freq: Every day | ORAL | Status: DC

## 2023-04-02 MED ORDER — IOHEXOL 9 MG/ML PO SOLN
500.0000 mL | ORAL | Status: AC
  Administered 2023-04-02 (×2): 500 mL via ORAL

## 2023-04-02 NOTE — TOC Initial Note (Signed)
Transition of Care [facility]) - Initial/Assessment Note   Patient Details  Name: Tamara Castro MRN: [id number] Date of Birth: 16-Sep-1961  Transition of Care [facility]) CM/SW Contact:    Ewing Schlein, LCSW Phone Number: 04/02/2023, 2:53 PM  Clinical Narrative: Patient currently intubated. TOC following for discharge needs.  Expected Discharge Plan:  (TBD) Barriers to Discharge: Continued Medical Work up  Patient Goals and CMS Choice Patient states their goals for this hospitalization and ongoing recovery are:: Patient currently intubated  Expected Discharge Plan and Services In-house Referral: Clinical Social Work Living arrangements for the past 2 months: Single Family Home  Prior Living Arrangements/Services Living arrangements for the past 2 months: Single Family Home Patient language and need for interpreter reviewed:: Yes Need for Family Participation in Patient Care: Yes (Comment) Care giver support system in place?: Yes (comment) Criminal Activity/Legal Involvement Pertinent to Current Situation/Hospitalization: No - Comment as needed  Emotional Assessment Appearance:: Appears stated age Attitude/Demeanor/Rapport: Unable to Assess (Currently intubated) Affect (typically observed): Unable to Assess Alcohol / Substance Use: Not Applicable Psych Involvement: No (comment)  Admission diagnosis:  Acute hypoxemic respiratory failure (HCC) [J96.01] Patient Active Problem List   Diagnosis Date Noted   Acute hypoxemic respiratory failure (HCC) 04/01/2023   PCP:  Royann Shivers, PA-C Pharmacy:   [facility] - [place], [place] - [address] [place] [address] Phone: [phone] Fax: [phone]  [facility] [place], [place] - [address] [place] [address] Phone: [phone] Fax: [phone]  Social Determinants of Health (SDOH) Social History: SDOH Screenings   Food Insecurity: No Food Insecurity (04/09/2021)   Housing: Low Risk  (04/09/2021)  Transportation Needs: No Transportation Needs (04/09/2021)  Alcohol Screen: Low Risk  (04/09/2021)  Depression (PHQ2-9): Low Risk  (04/09/2021)  Financial Resource Strain: Low Risk  (04/09/2021)  Physical Activity: Insufficiently Active (04/09/2021)  Social Connections: Socially Isolated (04/09/2021)  Stress: No Stress Concern Present (04/09/2021)  Tobacco Use: Low Risk  (03/21/2023)   Received from [facility], [facility]   SDOH Interventions:    Readmission Risk Interventions     No data to display

## 2023-04-02 NOTE — Progress Notes (Signed)
eLink Physician-Brief Progress Note Patient Name: Tamara Castro DOB: 1961-08-17 MRN: [id number]   Date of Service  04/02/2023  HPI/Events of Note  CT's reviewed.  eICU Interventions          Migdalia Dk 04/02/2023, 11:02 PM

## 2023-04-02 NOTE — Progress Notes (Signed)
eLink Physician-Brief Progress Note Patient Name: Tamara Castro DOB: Aug 05, 1961 MRN: [id number]   Date of Service  04/02/2023  HPI/Events of Note  Reviewed KUB to confirm OGT placement.  OGT terminates in the stomach. There is gaseous distention of the colon and small bowel, concerning for ileus.   eICU Interventions  Ok to use OGT.  Ok to place on intermittent wall suction.     Intervention Category Intermediate Interventions: Diagnostic test evaluation  Larinda Buttery 04/02/2023, 12:35 AM

## 2023-04-02 NOTE — Progress Notes (Signed)
Witnessed and wasted 40mL of Fentanyl from [facility] with Margarito Liner, RN Engineer, manufacturing systems)

## 2023-04-02 NOTE — Plan of Care (Signed)
 °  Problem: Clinical Measurements: °Goal: Respiratory complications will improve °Outcome: Progressing °Goal: Cardiovascular complication will be avoided °Outcome: Progressing °  °Problem: Activity: °Goal: Risk for activity intolerance will decrease °Outcome: Progressing °  °Problem: Coping: °Goal: Level of anxiety will decrease °Outcome: Progressing °  °Problem: Pain Managment: °Goal: General experience of comfort will improve °Outcome: Progressing °  °

## 2023-04-02 NOTE — Progress Notes (Signed)
   04/02/23 0907  Adult Ventilator Settings  Vent Type Servo i  Vent Mode (S)  CPAP;PSV  FiO2 (%) (S)  30 %  Pressure Support (S)  10 cmH20  PEEP (S)  5 cmH20  Adult Ventilator Measurements  Peak Airway Pressure 15 L/min  Mean Airway Pressure 8 cmH20  Resp Rate Spontaneous 11 br/min  Resp Rate Total 11 br/min  Spont TV 575 mL  Measured Ve 7.1 L  Total PEEP 5 cmH20  SpO2 97 %  Adult Ventilator Alarms  Alarms On Y  Ve High Alarm 20 L/min  Ve Low Alarm 4 L/min  Resp Rate High Alarm 30 br/min  Resp Rate Low Alarm 8  PEEP Low Alarm 3 cmH2O  Press High Alarm 45 cmH2O  T Apnea 20 sec(s)  VAP Prevention  HOB> 30 Degrees Y  Breath Sounds  Bilateral Breath Sounds Diminished

## 2023-04-02 NOTE — Progress Notes (Signed)
Initial Nutrition Assessment  DOCUMENTATION CODES:   Not applicable  INTERVENTION:  - Concern for chronic ileus vs obstruction.  - Patient to remain NPO for now, per CCM.   - If unable to start enteral feeds in the next 1-2 days, would recommend consideration of TPN.   - Per notes from [facility] it appears patient has not received EN since 7/13.   NUTRITION DIAGNOSIS:   Inadequate oral intake related to inability to eat as evidenced by NPO status.  GOAL:   Patient will meet greater than or equal to 90% of their needs  MONITOR:   Vent status, Labs, Weight trends  REASON FOR ASSESSMENT:   Ventilator    ASSESSMENT:   62 y.o. female with PMH COPD, chronic constipation, colectomy (in the 1990's) due to diverticulitis, fibromyalgia, depression who initially presented to [facility] for 1 week hx of URI symptoms admitted for acute hypoxic and hypercarbic respiratory failure 2/2 RLL PNA requiring intubation, septic shock, AKI and acute heart failure.    Shock and AKI improved and extubated 7/7 but required BiPAP.  Required reintubation 7/10 for respiratory failure.    Some concern for ileus with high OGT residuals with CT a/p showing slightly worse dilation of suspected chronic ileus with prior surgical changes. Possible emesis of TF since held, remains NPO on reglan.  Patient has failed SBT for several days. Transferred to [facility] ICU for further care.   7/5: admitted [facility] 7/7: extubated 7/10: reintubated  7/16: tx to [facility], remains intubated   Patient transferred to [facility] yesterday, remains intubated.  OGT remains in place, xray verified in stomach. Clamped this AM by CCM.  Per chart review, patient followed by RD during recent admit to [facility]. Was started on Jevity 1.5 at 72mL/hr on 7/10. TF was stopped 7/12 due to high residuals and patient not tolerating. Restarted 7/13 but once again stopped that day due to high residuals.  Per review of notes from recent admission to [facility], patient initially  weighed at 168# on 7/6. She was documented to be 199# by discharge from other facility on 7/16. Upon admission to [facility], patient weighed at 202#. Suspect weight may be elevated from baseline as patient experiencing generalized edema with very deep pitting in right and left upper extremity.  Patient noted to have chronic ileus versus obstriction. Per CCM note today only ~336mL out of OGT overnight. CCS now consulted and plan for CT a/p with oral contrast.   Medications reviewed and include: Colace, Insulin Fentanyl  Labs reviewed:  Creatinine 2.77   NUTRITION - FOCUSED PHYSICAL EXAM:  RD working remotely  Diet Order:   Diet Order             Diet NPO time specified  Diet effective now                   EDUCATION NEEDS:  Not appropriate for education at this time  Skin:  Skin Assessment: Skin Integrity Issues: Skin Integrity Issues:: Stage I Stage I: Left buttocks  Last BM:  7/16  Height:  Ht Readings from Last 1 Encounters:  04/01/23 5\' 5"  (1.651 m)   Weight:  Wt Readings from Last 1 Encounters:  04/02/23 92.3 kg   Ideal Body Weight:  56.82 kg  BMI:  Body mass index is 33.86 kg/m.  Estimated Nutritional Needs:  Kcal:  1800-2000 kcals Protein:  95-115 grams Fluid:  >/= 1.8L    Shelle Iron RD, LDN For contact information, refer to [facility].

## 2023-04-02 NOTE — Procedures (Signed)
Pt was transported via vent on full support from ICU 1233 to CT scan and back safely on 100% FI02, full E cylinder.  ETT secure.

## 2023-04-02 NOTE — Consult Note (Signed)
Consult Note  Tamara Castro 1960/10/11  [id number].    Requesting MD: Coralyn Helling, MD Chief Complaint/Reason for Consult: Ileus vs SBO HPI:  Patient is a 62 year old female who was transferred to [facility] from [facility] yesterday after being admitted 03/21/23 for acute hypoxic respiratory failure from URI. Required intubation and hospital course complicated by septic shock, AKI and CHF exacerbation. Patient transferred for critical care after failed SBT for several days. She had concern for ileus and OGT was placed and CT done at OSH which showed suspected chronic ileus with surgical changes of subtotal colectomy, no transition point. We are consulted. Patient able to answer questions on the vent and denies abdominal pain.   ROS: Review of Systems  Unable to perform ROS: Intubated    No family history on file.  Past Medical History:  Diagnosis Date   Anxiety    Asthma    Bladder polyps    per pt, urethral polyps   COPD (chronic obstructive pulmonary disease) (HCC)    Dysplasia of cervix    Hyperlipidemia    Osteoarthritis     Past Surgical History:  Procedure Laterality Date   ABDOMINAL HYSTERECTOMY     CHOLECYSTECTOMY     TONSILLECTOMY Bilateral    TOTAL COLECTOMY  1998    Social History:  reports that she has been smoking cigarettes. She has a 40 pack-year smoking history. She has never used smokeless tobacco. She reports that she does not currently use alcohol. She reports that she does not currently use drugs.  Allergies:  Allergies  Allergen Reactions   Ciprofloxacin Diarrhea    Per pt, this medication gave her c-diff    Medications Prior to Admission  Medication Sig Dispense Refill   albuterol (ACCUNEB) 0.63 MG/3ML nebulizer solution Take 1 ampule by nebulization every 4 (four) hours as needed for wheezing or shortness of breath.     ALPRAZolam (XANAX) 1 MG tablet Take 1 mg by mouth 4 (four) times daily.     ANORO ELLIPTA 62.5-25 MCG/INH AEPB  Inhale 1 puff into the lungs daily.     diphenhydrAMINE HCl, Sleep, (ZZZQUIL PO) Take 2 tablets by mouth at bedtime. Pt takes 2 gummies every night     escitalopram (LEXAPRO) 10 MG tablet Take 10 mg by mouth 2 (two) times daily.     gabapentin (NEURONTIN) 400 MG capsule Take 800 mg by mouth 3 (three) times daily as needed.     ondansetron (ZOFRAN) 4 MG tablet Take 4 mg by mouth every 8 (eight) hours as needed for nausea or vomiting.     PROAIR HFA 108 (90 Base) MCG/ACT inhaler Inhale 2 puffs into the lungs 4 (four) times daily.      Blood pressure 138/76, pulse 65, temperature (!) 97.5 F (36.4 C), temperature source Axillary, resp. rate 20, height 5\' 5"  (1.651 m), weight 92.3 kg, SpO2 97%. Physical Exam:  General: intubated WD, overweight female who is laying in bed HEENT: head is normocephalic, atraumatic.  Sclera are noninjected.  EOMI.  Ears and nose without any masses or lesions.  Mouth is pink and dry Heart: regular, rate, and rhythm. Palpable radial and pedal pulses bilaterally Lungs: vented  Abd: soft, NT, mild to mod distention, OGT with some feculent drainage, midline surgical scar Skin: warm and dry with no masses, lesions, or rashes    Results for orders placed or performed during the hospital encounter of 04/01/23 (from the past 48 hour(s))  Glucose, capillary  Status: None   Collection Time: 04/01/23  3:40 PM  Result Value Ref Range   Glucose-Capillary 83 70 - 99 mg/dL    Comment: Glucose reference range applies only to samples taken after fasting for at least 8 hours.  HIV Antibody (routine testing w rflx)     Status: None   Collection Time: 04/01/23  4:02 PM  Result Value Ref Range   HIV Screen 4th Generation wRfx Non Reactive Non Reactive    Comment: Performed at [facility], [address], [place], [place] [address]  CBC     Status: Abnormal   Collection Time: 04/01/23  4:02 PM  Result Value Ref Range   WBC 20.4 (H) 4.0 - 10.5 K/uL   RBC 3.61 (L) 3.87 -  5.11 MIL/uL   Hemoglobin 11.1 (L) 12.0 - 15.0 g/dL   HCT 60.4 54.0 - 98.1 %   MCV 100.0 80.0 - 100.0 fL   MCH 30.7 26.0 - 34.0 pg   MCHC 30.7 30.0 - 36.0 g/dL   RDW 19.1 47.8 - 29.5 %   Platelets 116 (L) 150 - 400 K/uL    Comment: REPEATED TO VERIFY   nRBC 0.0 0.0 - 0.2 %    Comment: Performed at [facility], [address], [place], [place] [address]  Comprehensive metabolic panel     Status: Abnormal   Collection Time: 04/01/23  4:02 PM  Result Value Ref Range   Sodium 143 135 - 145 mmol/L   Potassium 3.5 3.5 - 5.1 mmol/L   Chloride 111 98 - 111 mmol/L   CO2 24 22 - 32 mmol/L   Glucose, Bld 93 70 - 99 mg/dL    Comment: Glucose reference range applies only to samples taken after fasting for at least 8 hours.   BUN 53 (H) 8 - 23 mg/dL   Creatinine, Ser 8.65 (H) 0.44 - 1.00 mg/dL   Calcium 8.0 (L) 8.9 - 10.3 mg/dL   Total Protein 5.1 (L) 6.5 - 8.1 g/dL   Albumin 2.2 (L) 3.5 - 5.0 g/dL   AST 14 (L) 15 - 41 U/L   ALT 17 0 - 44 U/L   Alkaline Phosphatase 46 38 - 126 U/L   Total Bilirubin 0.8 0.3 - 1.2 mg/dL   GFR, Estimated 24 (L) >60 mL/min    Comment: (NOTE) Calculated using the CKD-EPI Creatinine Equation (2021)    Anion gap 8 5 - 15    Comment: Performed at [facility], [address] [place], [place] [address]  Magnesium     Status: None   Collection Time: 04/01/23  4:02 PM  Result Value Ref Range   Magnesium 2.1 1.7 - 2.4 mg/dL    Comment: Performed at [facility], [address] [place], [place] [address]  Phosphorus     Status: None   Collection Time: 04/01/23  4:02 PM  Result Value Ref Range   Phosphorus 3.3 2.5 - 4.6 mg/dL    Comment: Performed at [facility], [address] [place], [place] [address]  Brain natriuretic peptide     Status: Abnormal   Collection Time: 04/01/23  4:02 PM  Result Value Ref Range   B Natriuretic Peptide 102.6 (H) 0.0 - 100.0 pg/mL    Comment: Performed  at [facility], [address], [place], [place] [address]  Procalcitonin     Status: None   Collection Time: 04/01/23  4:02 PM  Result Value  Ref Range   Procalcitonin 0.56 ng/mL    Comment:        Interpretation: PCT > 0.5 ng/mL and <= 2 ng/mL: Systemic infection (sepsis) is possible, but other conditions are known to elevate PCT as well. (NOTE)       Sepsis PCT Algorithm           Lower Respiratory Tract                                      Infection PCT Algorithm    ----------------------------     ----------------------------         PCT < 0.25 ng/mL                PCT < 0.10 ng/mL          Strongly encourage             Strongly discourage   discontinuation of antibiotics    initiation of antibiotics    ----------------------------     -----------------------------       PCT 0.25 - 0.50 ng/mL            PCT 0.10 - 0.25 ng/mL               OR       >80% decrease in PCT            Discourage initiation of                                            antibiotics      Encourage discontinuation           of antibiotics    ----------------------------     -----------------------------         PCT >= 0.50 ng/mL              PCT 0.26 - 0.50 ng/mL                AND       <80% decrease in PCT             Encourage initiation of                                             antibiotics       Encourage continuation           of antibiotics    ----------------------------     -----------------------------        PCT >= 0.50 ng/mL                  PCT > 0.50 ng/mL               AND         increase in PCT                  Strongly encourage                                      initiation of antibiotics    Strongly encourage escalation  of antibiotics                                     -----------------------------                                           PCT <= 0.25 ng/mL                                                 OR                                         > 80% decrease in PCT                                      Discontinue / Do not initiate                                             antibiotics  Performed at [facility], [address] [place], [place] [address]   Blood gas, arterial     Status: Abnormal   Collection Time: 04/01/23  4:30 PM  Result Value Ref Range   FIO2 35 %   Delivery systems VENTILATOR    Mode PRESSURE REGULATED VOLUME CONTROL    MECHVT 44 mL   RATE 20 resp/min   PEEP 5 cm H20   pH, Arterial 7.26 (L) 7.35 - 7.45   pCO2 arterial 52 (H) 32 - 48 mmHg   pO2, Arterial 106 83 - 108 mmHg   Bicarbonate 23.3 20.0 - 28.0 mmol/L   Acid-base deficit 4.1 (H) 0.0 - 2.0 mmol/L   O2 Saturation 100 %   Patient temperature 37.0    Collection site RIGHT RADIAL    Drawn by [id number]    Allens test (pass/fail) PASS PASS    Comment: Performed at [facility], [address], [place], [place] [address]  Urinalysis, Routine w reflex microscopic -Urine, Catheterized     Status: Abnormal   Collection Time: 04/01/23  5:52 PM  Result Value Ref Range   Color, Urine YELLOW YELLOW   APPearance CLOUDY (A) CLEAR   Specific Gravity, Urine 1.015 1.005 - 1.030   pH 5.0 5.0 - 8.0   Glucose, UA NEGATIVE NEGATIVE mg/dL   Hgb urine dipstick MODERATE (A) NEGATIVE   Bilirubin Urine NEGATIVE NEGATIVE   Ketones, ur 5 (A) NEGATIVE mg/dL   Protein, ur 956 (A) NEGATIVE mg/dL   Nitrite NEGATIVE NEGATIVE   Leukocytes,Ua MODERATE (A) NEGATIVE   RBC / HPF 11-20 0 - 5 RBC/hpf   WBC, UA 21-50 0 - 5 WBC/hpf   Bacteria, UA RARE (A) NONE SEEN   Squamous Epithelial / HPF 0-5 0 - 5 /HPF   Mucus PRESENT    Budding Yeast PRESENT    Hyaline Casts, UA PRESENT     Comment: Performed at [facility], [address], [place], [place] [address]  MRSA Next Gen by PCR, Nasal     Status: None   Collection Time: 04/01/23  5:52 PM   Specimen: Nasal Mucosa; Nasal Swab  Result Value Ref Range   MRSA by  PCR Next Gen NOT DETECTED NOT DETECTED    Comment: (NOTE) The GeneXpert MRSA Assay (FDA approved for NASAL specimens only), is one component of a comprehensive MRSA colonization surveillance program. It is not intended to diagnose MRSA infection nor to guide or monitor treatment for MRSA infections. Test performance is not FDA approved in patients less than 35 years old. Performed at [facility], [address] [place], [place] [address]   Glucose, capillary     Status: Abnormal   Collection Time: 04/01/23  7:32 PM  Result Value Ref Range   Glucose-Capillary 113 (H) 70 - 99 mg/dL    Comment: Glucose reference range applies only to samples taken after fasting for at least 8 hours.   Comment 1 Notify RN    Comment 2 Document in Chart   Glucose, capillary     Status: Abnormal   Collection Time: 04/01/23 11:16 PM  Result Value Ref Range   Glucose-Capillary 104 (H) 70 - 99 mg/dL    Comment: Glucose reference range applies only to samples taken after fasting for at least 8 hours.   Comment 1 Notify RN    Comment 2 Document in Chart   Glucose, capillary     Status: None   Collection Time: 04/02/23  3:17 AM  Result Value Ref Range   Glucose-Capillary 91 70 - 99 mg/dL    Comment: Glucose reference range applies only to samples taken after fasting for at least 8 hours.   Comment 1 Notify RN    Comment 2 Document in Chart   Basic metabolic panel     Status: Abnormal   Collection Time: 04/02/23  5:33 AM  Result Value Ref Range   Sodium 141 135 - 145 mmol/L   Potassium 4.0 3.5 - 5.1 mmol/L   Chloride 112 (H) 98 - 111 mmol/L   CO2 21 (L) 22 - 32 mmol/L   Glucose, Bld 89 70 - 99 mg/dL    Comment: Glucose reference range applies only to samples taken after fasting for at least 8 hours.   BUN 59 (H) 8 - 23 mg/dL   Creatinine, Ser 0.34 (H) 0.44 - 1.00 mg/dL   Calcium 7.9 (L) 8.9 - 10.3 mg/dL   GFR, Estimated 19 (L) >60 mL/min    Comment: (NOTE) Calculated using the  CKD-EPI Creatinine Equation (2021)    Anion gap 8 5 - 15    Comment: Performed at [facility], [address] [place], [place] [address]  Procalcitonin     Status: None   Collection Time: 04/02/23  5:33 AM  Result Value Ref Range   Procalcitonin 0.60 ng/mL    Comment:        Interpretation: PCT > 0.5 ng/mL and <= 2 ng/mL: Systemic infection (sepsis) is possible, but other conditions are known to elevate PCT as well. (NOTE)       Sepsis PCT Algorithm           Lower Respiratory Tract                                      Infection PCT  Algorithm    ----------------------------     ----------------------------         PCT < 0.25 ng/mL                PCT < 0.10 ng/mL          Strongly encourage             Strongly discourage   discontinuation of antibiotics    initiation of antibiotics    ----------------------------     -----------------------------       PCT 0.25 - 0.50 ng/mL            PCT 0.10 - 0.25 ng/mL               OR       >80% decrease in PCT            Discourage initiation of                                            antibiotics      Encourage discontinuation           of antibiotics    ----------------------------     -----------------------------         PCT >= 0.50 ng/mL              PCT 0.26 - 0.50 ng/mL                AND       <80% decrease in PCT             Encourage initiation of                                             antibiotics       Encourage continuation           of antibiotics    ----------------------------     -----------------------------        PCT >= 0.50 ng/mL                  PCT > 0.50 ng/mL               AND         increase in PCT                  Strongly encourage                                      initiation of antibiotics    Strongly encourage escalation           of antibiotics                                     -----------------------------                                           PCT <= 0.25 ng/mL  OR                                        > 80% decrease in PCT                                      Discontinue / Do not initiate                                             antibiotics  Performed at [facility], [address] [place], [place] [address]   Magnesium     Status: None   Collection Time: 04/02/23  5:33 AM  Result Value Ref Range   Magnesium 2.0 1.7 - 2.4 mg/dL    Comment: Performed at [facility], [address], [place], [place] [address]  Glucose, capillary     Status: None   Collection Time: 04/02/23  7:12 AM  Result Value Ref Range   Glucose-Capillary 73 70 - 99 mg/dL    Comment: Glucose reference range applies only to samples taken after fasting for at least 8 hours.   Comment 1 Notify RN    Comment 2 Document in Chart   CBC     Status: Abnormal   Collection Time: 04/02/23  8:45 AM  Result Value Ref Range   WBC 19.3 (H) 4.0 - 10.5 K/uL   RBC 3.51 (L) 3.87 - 5.11 MIL/uL   Hemoglobin 10.9 (L) 12.0 - 15.0 g/dL   HCT 09.8 (L) 11.9 - 14.7 %   MCV 99.7 80.0 - 100.0 fL   MCH 31.1 26.0 - 34.0 pg   MCHC 31.1 30.0 - 36.0 g/dL   RDW 82.9 56.2 - 13.0 %   Platelets 113 (L) 150 - 400 K/uL   nRBC 0.0 0.0 - 0.2 %    Comment: Performed at [facility], [address] [place], [place] [address]  Glucose, capillary     Status: None   Collection Time: 04/02/23 11:50 AM  Result Value Ref Range   Glucose-Capillary 83 70 - 99 mg/dL    Comment: Glucose reference range applies only to samples taken after fasting for at least 8 hours.   Comment 1 Notify RN    Comment 2 Document in Chart    ECHOCARDIOGRAM COMPLETE  Result Date: 04/02/2023    ECHOCARDIOGRAM REPORT   Patient Name:   Tamara Castro Date of Exam: 04/02/2023 Medical Rec #:  [id number]           Height:       65.0 in Accession #:    [id number]          Weight:       203.5 lb Date of Birth:  [birth year]           BSA:          1.993 m Patient Age:    61 years            BP:           137/72 mmHg Patient Gender: F                   HR:  64 bpm. Exam Location:  Inpatient Procedure: 2D Echo, Cardiac Doppler and Color Doppler Indications:    Elevated Troponin  History:        Patient has no prior history of Echocardiogram examinations.                 COPD; Risk Factors:Dyslipidemia.  Sonographer:    Harriette Bouillon RDCS Referring Phys: [phone] PAULA B SIMPSON IMPRESSIONS  1. Left ventricular ejection fraction, by estimation, is 60 to 65%. The left ventricle has normal function. The left ventricle has no regional wall motion abnormalities. Left ventricular diastolic parameters were normal.  2. Right ventricular systolic function is normal. The right ventricular size is normal.  3. The mitral valve is normal in structure. No evidence of mitral valve regurgitation. No evidence of mitral stenosis.  4. The aortic valve is tricuspid. There is mild calcification of the aortic valve. Aortic valve regurgitation is not visualized. No aortic stenosis is present.  5. The inferior vena cava is dilated in size with <50% respiratory variability, suggesting right atrial pressure of 15 mmHg. FINDINGS  Left Ventricle: Left ventricular ejection fraction, by estimation, is 60 to 65%. The left ventricle has normal function. The left ventricle has no regional wall motion abnormalities. The left ventricular internal cavity size was normal in size. There is  no left ventricular hypertrophy. Left ventricular diastolic parameters were normal. Right Ventricle: The right ventricular size is normal. No increase in right ventricular wall thickness. Right ventricular systolic function is normal. Left Atrium: Left atrial size was normal in size. Right Atrium: Right atrial size was normal in size. Pericardium: There is no evidence of pericardial effusion. Mitral Valve: The mitral valve is normal in structure. No evidence of mitral valve regurgitation. No  evidence of mitral valve stenosis. Tricuspid Valve: The tricuspid valve is normal in structure. Tricuspid valve regurgitation is trivial. No evidence of tricuspid stenosis. Aortic Valve: The aortic valve is tricuspid. There is mild calcification of the aortic valve. Aortic valve regurgitation is not visualized. No aortic stenosis is present. Pulmonic Valve: The pulmonic valve was normal in structure. Pulmonic valve regurgitation is trivial. No evidence of pulmonic stenosis. Aorta: The aortic root is normal in size and structure. Venous: The inferior vena cava is dilated in size with less than 50% respiratory variability, suggesting right atrial pressure of 15 mmHg. IAS/Shunts: No atrial level shunt detected by color flow Doppler.  LEFT VENTRICLE PLAX 2D LVIDd:         5.10 cm   Diastology LVIDs:         3.90 cm   LV e' medial:    7.83 cm/s LV PW:         0.70 cm   LV E/e' medial:  10.0 LV IVS:        0.70 cm   LV e' lateral:   7.51 cm/s LVOT diam:     2.10 cm   LV E/e' lateral: 10.5 LV SV:         71 LV SV Index:   35 LVOT Area:     3.46 cm  RIGHT VENTRICLE             IVC RV S prime:     14.70 cm/s  IVC diam: 2.40 cm TAPSE (M-mode): 1.8 cm LEFT ATRIUM             Index        RIGHT ATRIUM           Index LA  diam:        2.70 cm 1.35 cm/m   RA Area:     10.10 cm LA Vol (A2C):   35.7 ml 17.92 ml/m  RA Volume:   18.00 ml  9.03 ml/m LA Vol (A4C):   30.5 ml 15.31 ml/m LA Biplane Vol: 35.8 ml 17.97 ml/m  AORTIC VALVE LVOT Vmax:   86.50 cm/s LVOT Vmean:  53.400 cm/s LVOT VTI:    0.204 m  AORTA Ao Root diam: 3.00 cm Ao Asc diam:  2.80 cm MITRAL VALVE MV Area (PHT): 4.06 cm    SHUNTS MV Decel Time: 187 msec    Systemic VTI:  0.20 m MV E velocity: 78.60 cm/s  Systemic Diam: 2.10 cm MV A velocity: 78.60 cm/s MV E/A ratio:  1.00 Arvilla Meres MD Electronically signed by Arvilla Meres MD Signature Date/Time: 04/02/2023/12:09:52 PM    Final    Portable Chest xray  Result Date: 04/02/2023 CLINICAL DATA:   Respiratory failure. EXAM: PORTABLE CHEST 1 VIEW COMPARISON:  04/01/2023 FINDINGS: The endotracheal tube tip is stable approximately 4.9 cm above the carina. The left IJ catheter tip is in the projection of the mid SVC. Enteric tube has been advanced with tip below the GE junction. Stable cardiomediastinal contours. Advanced changes of emphysema. Persistent opacity within the left lung base which obscures the left hemidiaphragm. IMPRESSION: 1. Stable endotracheal tube. 2. Enteric tube has been advanced with tip below the GE junction. 3. Persistent left basilar opacity which may reflect atelectasis or pneumonia. 4. Advanced emphysema. Electronically Signed   By: Signa Kell M.D.   On: 04/02/2023 08:30   DG Abd 1 View  Result Date: 04/01/2023 CLINICAL DATA:  Confirm OG tube placement. EXAM: ABDOMEN - 1 VIEW COMPARISON:  04/01/2023. FINDINGS: A mildly distended gas-filled loop of colon is noted in the upper abdomen. There is mild gaseous distention of the small bowel measuring 4.0 cm. An enteric tube terminates in the stomach. Mild airspace disease is noted at the lung bases. There is blunting of the costophrenic angles bilaterally suggesting small pleural effusions. Surgical clips are noted in the right upper quadrant. IMPRESSION: 1. Enteric tube terminates in the stomach. 2. Gaseous distention of the colon and small bowel, possible ileus or obstruction. 3. Small bilateral pleural effusions with atelectasis or infiltrate. Electronically Signed   By: Thornell Sartorius M.D.   On: 04/01/2023 23:40   DG Abd 1 View  Result Date: 04/01/2023 CLINICAL DATA:  Confirmation of OG tube placement. EXAM: ABDOMEN - 1 VIEW COMPARISON:  04/01/2023. FINDINGS: Mildly distended gas-filled loop colon is noted in the upper abdomen. An enteric tube terminates in the stomach and appears to be in appropriate position. No radio-opaque calculi. Stable opacities are noted at the lung bases. IMPRESSION: 1. Enteric tube terminates in the  stomach. 2. Mildly distended gas-filled loop of colon in the upper abdomen, unchanged. Electronically Signed   By: Thornell Sartorius M.D.   On: 04/01/2023 22:00   DG Abd 1 View  Result Date: 04/01/2023 CLINICAL DATA:  OG tube placement EXAM: ABDOMEN - 1 VIEW COMPARISON:  04/01/2023 FINDINGS: Partially visualized vascular catheter tip at brachiocephalic confluence. Interstitial thickening at the lung bases. Esophageal tube tip overlies the proximal stomach, possible fold or kink in the distal tube. IMPRESSION: Esophageal tube tip overlies the proximal stomach, possible fold or kink in the distal tube. Electronically Signed   By: Jasmine Pang M.D.   On: 04/01/2023 18:20   DG Abd 1 View  Result Date:  04/01/2023 CLINICAL DATA:  Ileus. EXAM: ABDOMEN - 1 VIEW COMPARISON:  Concurrent chest radiographs. Abdominopelvic CT and abdominal radiographs 03/27/2023. FINDINGS: Enteric tube is not visualized on this examination, tip at the level of the midesophagus on concurrent chest radiographs. Tube advancement recommended. In correlation with prior imaging, the patient appears to be status post subtotal colectomy with an ileorectal anastomosis. Enteric contrast has passed into the rectum. Unchanged moderate diffuse small bowel distension. No extravasated enteric contrast identified. Previously questioned possible intraluminal gallstone at the ileorectal anastomosis is not well seen on these images. IMPRESSION: 1. Enteric tube is not visualized on this examination, tip at the level of the midesophagus on concurrent chest radiographs. Tube advancement recommended. 2. Unchanged moderate diffuse small bowel distension status post subtotal colectomy and ileorectal anastomosis. Findings could be secondary to an ileus. However, there may be partial obstruction from a suggested intraluminal gallstone at the anastomosis on prior imaging. Electronically Signed   By: Carey Bullocks M.D.   On: 04/01/2023 17:01   DG Chest Port 1  View  Result Date: 04/01/2023 CLINICAL DATA:  Endotracheal tube present. EXAM: PORTABLE CHEST 1 VIEW COMPARISON:  Radiographs 03/31/2023 and 03/29/2023.  CT 03/21/2023. FINDINGS: 1606 hours. Tip of the endotracheal tube overlies the mid trachea. Left IJ central venous catheter projects to the upper SVC. Enteric tube tip projects over the left atrium with the side hole at the level of the aortic arch. The should be advanced at least 20 cm. The heart size and mediastinal contours are stable. Unchanged residual patchy airspace opacities at both lung bases. Underlying emphysema noted. No evidence of pneumothorax or significant pleural effusion. No acute osseous findings are seen. IMPRESSION: 1. Enteric tube tip projects over the left atrium and should be advanced at least 20 cm. 2. No other significant changes from prior study. Persistent bibasilar airspace opacities superimposed on emphysema. Electronically Signed   By: Carey Bullocks M.D.   On: 04/01/2023 16:53      Assessment/Plan Ileus vs SBO - NGT with feculent appearing material  - Hx of abdominal hysterectomy, total colectomy, cholecystectomy  - abdominal exam is actually pretty benign  - WBC 19K, afebrile  - CT AP pending - no indication for emergent surgical exploration, will follow up CT results. If more of an ileus picture would recommend GI consult since ileus is a functional/motility issue    FEN: NPO, IVF per primary, NGT to LIWS VTE: LMWH ID: no current abx  - per CCM -  VDRF with HCAP and COPD CAD Acute on chronic Combined CHF  I reviewed last 24 h vitals and pain scores, last 48 h intake and output, last 24 h labs and trends, last 24 h imaging results, and CCM notes  .  Juliet Rude, [facility] 04/02/2023, 1:25 PM Please see Amion for pager number during day hours 7:00am-4:30pm

## 2023-04-02 NOTE — Progress Notes (Signed)
   04/02/23 0948  Vent Select  Invasive or Noninvasive Invasive  Adult Vent Y  Airway 7.5 mm  Placement Date/Time: 04/01/23 1511   Inserted prior to hospital arrival?: Placed at another facility  Difficult airway due to:: Difficulty was unanticipated  Placed By: (c) Other (Comment)  ETT Types: Oral  Size (mm): 7.5 mm  Cuffed: Cuffed  Secured a...  Secured at (cm) 24 cm  Measured From Lips  Secured Location Center  Secured By Commercial Tube Holder  Cuff Pressure (cm H2O) Green OR 18-26 CmH2O  Site Condition Dry  Adult Ventilator Settings  Vent Type Servo i  Humidity HME  Vent Mode (S)  PRVC (Changed back to [facility] due to low VE, pt is too sleepy this am to wean appropriately. Total PSV wean: 37 mins.)  Vt Set 450 mL  Set Rate 20 bmp  FiO2 (%) 30 %  I Time 0.81 Sec(s)  PEEP 5 cmH20  Adult Ventilator Measurements  Peak Airway Pressure 24 L/min  Mean Airway Pressure 10 cmH20  Resp Rate Total 20 br/min  Exhaled Vt 458 mL  Measured Ve 6.8 L  I:E Ratio Measured 1:2.7  Total PEEP 5 cmH20  SpO2 97 %  Adult Ventilator Alarms  Alarms On Y  Ve High Alarm 20 L/min  Ve Low Alarm 4 L/min  Resp Rate High Alarm 38 br/min  Resp Rate Low Alarm 8  PEEP Low Alarm 3 cmH2O  Press High Alarm 45 cmH2O  T Apnea 20 sec(s)  VAP Prevention  HOB> 30 Degrees Y

## 2023-04-02 NOTE — Progress Notes (Signed)
NAME:  Tamara Castro, MRN:  [id number], DOB:  [birth year], LOS: 1 ADMISSION DATE:  04/01/2023, CONSULTATION DATE:  04/01/23 REFERRING MD:  Salvadore Dom, CHIEF COMPLAINT:  resp failure   History of Present Illness:  HPI obtained from EMR review as patient is intubated and sedated on MV.   6 yoF with PMH as below who presented to [facility] on 7/5 after 1 week hx of URI symptoms admitted for acute hypoxic and hypercarbic respiratory failure 2/2 RLL PNA requiring intubation, septic shock, AKI and acute heart failure.  Found to have pBNP of 49k with elevated troponin's thought secondary to demand ischemia with echo with normal EF, reduced RV, indeterminate diastolic function.  CTA neg for PE 7/5.  CTH neg 7/9.  Covered with azithromycin/ ceftriaxone, switched to cefepime on 7/7.  Shock and AKI improved and extubated 7/7 but required BiPAP.  Required reintubation 7/10 for respiratory failure.  Increasing WBC 7/13, placed on doxycyline switched to vancomycin with elevated PCT with CXR showing increased bibasilar atelectasis vs infiltrates with increasing pleural effusions.  Initial blood culture x1 w/ stap epi, thought contaminate with repeat BC 7/7 negative.  Some concern for ileus with high OGT residuals with CT a/p showing slightly worse dilation of suspected chronic ileus with prior surgical changes but no transition point, possible emesis of TF since held, remains, NPO on reglan.  Patient has failed SBT for several days, due to apnea despite decreased sedation.  Transferred to [facility] ICU for further care, admitted to PCCM.   Pertinent  Medical History  Tobacco abuse,  COPD, chronic constipation, prior colectomy in 1990's 2/2 diverticulitis, fibromyalgia (on disability since 2008), depression, anxiety, neuropathy  Home meds> xanax (1mg  QID), lexapro, gabapentin, anoro elliptia, zofran  Significant Hospital Events: Including procedures, antibiotic start and stop dates in addition to other pertinent events   7/5  admitted [facility] 7/7 extubated 7/10 reintubated  7/16 tx to [facility]  7/5-7/7 ctx/ azitro 7/7 vanc> 7/8; 7/14- 7/15 7/7 cefepime> 7/16 7/13 doxy> 7/14  Interim History / Subjective:  Poor response to diureses, sCr up CVP 16 out of OGT, bilious w/concern for stool Afebrile   Objective   Blood pressure (!) 150/82, pulse 62, temperature (!) 97.5 F (36.4 C), temperature source Axillary, resp. rate 20, height 5\' 5"  (1.651 m), weight 92.3 kg, SpO2 97%. CVP:  [15 mmHg-16 mmHg] 16 mmHg  Vent Mode: CPAP;PSV FiO2 (%):  [30 %-35 %] 30 % Set Rate:  [20 bmp] 20 bmp Vt Set:  [440 mL-450 mL] 450 mL PEEP:  [5 cmH20] 5 cmH20 Pressure Support:  [10 cmH20] 10 cmH20 Plateau Pressure:  [17 cmH20-31 cmH20] 17 cmH20   Intake/Output Summary (Last 24 hours) at 04/02/2023 0916 Last data filed at 04/02/2023 0800 Gross per 24 hour  Intake 453.2 ml  Output 900 ml  Net -446.8 ml   Filed Weights   04/01/23 1529 04/02/23 0500  Weight: 91.8 kg 92.3 kg   Examination: Dex 0.4, fent 75 General:  Critically ill appearing female in NAD on MV HEENT: MM pink/moist, ETT/ OGT- bilious with dark/ brown appearing material ?feculent, pupils 3/r Neuro: opens eyes to verbal and follows brief commands, MAE CV: rr, NSR, no murmur PULM:  MV supported, diminished throughout, no wheeze, minimal white/mucous secretions GI: obese, hypoBS, soft, no apparent distention or tenderness, foley- minimal cyu Extremities: warm/dry, anasarca with weeping BLE Skin: ecchymosis to BUE  Afebrile UOP 650 ml/ 12hrs Labs reviewed> cbc pending, PCT 0.56> 0.6, BNP 102, sCr 2.26> 2.77, BUN  53> 59, bicarb 21, K 4, Mag 2  Resolved Hospital Problem list    Assessment & Plan:   Acute hypoxic and hypercarbic respiratory failure 2/2 AECOPD, Moraxella PNA, pleural effusions, and r/o HCAP vs new aspiration component Tobacco abuse - OSH urine legionella, strep, SARS neg - TF noted coming out of nares/ oral at OSH - reintubated 7/10 at  OSH P:  - cont full MV support, 4-8cc/kg IBW with goal Pplat <30 and DP<15  - wean FiO2 as able for SpO2 >90% - VAP prevention protocol/ PPI - PAD protocol for sedation> cont precedex, change fentanyl to bolus prn  - CXR with residual bibasilar opacities  - follow trach asp - daily WUA/ SBT> sleeping but tolerating some PSV 10/5 - cont duonebs q4, brovanna, pulmicort, prn albuterol - pulmonary hygiene - smoking cessation when appropriate  Sepsis - increasing leukocytosis since 7/13 with elevated PCT but reportedly afebrile.   - 7/5 BC x1 w/ stap epi thought contaminate with repeat BC 7/7  neg - abx coverage at OSH 7/5> 7/16, s/p 10 days cefepime, vanc x 3 P:  - WBC stable but elevated, remains afebrile - PCT trend stable, repeat in am  - cont to hold on abx and monitor clinically - d/c CVL when able to get PIV's > limited due to anasarca - ideally remove CVL but given edema may not be able to get PIVs - follow trach asp, send UC> UA +leuks, WBC 21-50 - trend CBC/ fever curve    Mild to moderate RV dysfunction Acute Heart failure, new onset CAD Elevated troponin's thought secondary to demand ischemia Prolonged QTc - Echo moderately reduced RV, normal EF, indeterminate DD, did not comment on  P:  - EKG reassuring, no STD/ STE, Qtc wnl - holding ASA/ statin while NPO - pending Echo - trending CVP> remains elevated - albumin f/b lasix x 2 today  - optimize electrolytes  - OSH CT showing evidence of 3 vessel CAD.  Will need further cardiology workup when critical illness resolved.    AKI, concern for ATN, s/p IV contrast 7/11, concern for cardiorenal renal syndrome Hypernatremia, resolved - poor response to lasix overnight> today albumin f/b lasix x 2, repeat BMET at 1600 - may need Nephrology consult if worsening - cont foley - trend renal indices  - strict I/Os, daily wts - avoid nephrotoxins, renal dose meds, hemodynamic support as above   Chronic ileus vs  obstruction Hx of  - OSH CT a/p 7/11 chronic dilation of small bowel, likely 2/2 colectomy hx - reports of tube feeds coming out of nares pta P:  - not impressive amount out of OGT overnight, ~350, remains bilious but ?other brown material mixed in> sediment vs feculent material, smear BM overnight - discussed with CCS, will proceed with CT a/p, attempt oral contrast  - keep NPO for now   Normocytic anemia Thrombocytopenia - ddx sepsis/ critical illness  - H/H and platelets stable, no evidence of bleeding, continue to monitor - transfuse for Hgb < 7   Anxiety/ depression- benzo dependent - holding lexapro and xanax while NPO  Best Practice (right click and "Reselect all SmartList Selections" daily)   Diet/type: NPO DVT prophylaxis: prophylactic heparin  GI prophylaxis: PPI Lines: Central line Foley:  Yes, and it is still needed Code Status:  full code Last date of multidisciplinary goals of care discussion [pending]  Daughter [name] [phone]> pending update 7/17  Labs   CBC: Recent Labs  Lab 04/01/23 1602  WBC 20.4*  HGB 11.1*  HCT 36.1  MCV 100.0  PLT 116*    Basic Metabolic Panel: Recent Labs  Lab 04/01/23 1602 04/02/23 0533  NA 143 141  K 3.5 4.0  CL 111 112*  CO2 24 21*  GLUCOSE 93 89  BUN 53* 59*  CREATININE 2.26* 2.77*  CALCIUM 8.0* 7.9*  MG 2.1 2.0  PHOS 3.3  --    GFR: Estimated Creatinine Clearance: 23.6 mL/min (A) (by C-G formula based on SCr of 2.77 mg/dL (H)). Recent Labs  Lab 04/01/23 1602 04/02/23 0533  PROCALCITON 0.56 0.60  WBC 20.4*  --     Liver Function Tests: Recent Labs  Lab 04/01/23 1602  AST 14*  ALT 17  ALKPHOS 46  BILITOT 0.8  PROT 5.1*  ALBUMIN 2.2*   No results for input(s): "LIPASE", "AMYLASE" in the last 168 hours. No results for input(s): "AMMONIA" in the last 168 hours.  ABG    Component Value Date/Time   PHART 7.26 (L) 04/01/2023 1630   PCO2ART 52 (H) 04/01/2023 1630   PO2ART 106  04/01/2023 1630   HCO3 23.3 04/01/2023 1630   ACIDBASEDEF 4.1 (H) 04/01/2023 1630   O2SAT 100 04/01/2023 1630     Coagulation Profile: No results for input(s): "INR", "PROTIME" in the last 168 hours.  Cardiac Enzymes: No results for input(s): "CKTOTAL", "CKMB", "CKMBINDEX", "TROPONINI" in the last 168 hours.  HbA1C: No results found for: "HGBA1C"  CBG: Recent Labs  Lab 04/01/23 1540 04/01/23 1932 04/01/23 2316 04/02/23 0317 04/02/23 0712  GLUCAP 83 113* 104* 91 73    Critical care time:  35 mins       Posey Boyer, MSN, NP, AG-ACNP-BC [facility] 04/02/2023, 9:16 AM  See Amion for pager If no response to pager , please call [phone] until 7pm After 7:00 pm call Elink  [phone]

## 2023-04-02 NOTE — Progress Notes (Signed)
eLink Physician-Brief Progress Note Patient Name: Tamara Castro DOB: 1961-06-30 MRN: [id number]   Date of Service  04/02/2023  HPI/Events of Note  Notified of bradycardia with rate in the 50s, going down to as low as 44.  SBP 110s-130s.  Precedex gtt has been turned off. Pt is intubated and on fentanyl gtt.  eICU Interventions  Continue to monitor as long as HR >45 and hemodynamically stable.      Intervention Category Intermediate Interventions: Arrhythmia - evaluation and management  Larinda Buttery 04/02/2023, 1:35 AM

## 2023-04-03 ENCOUNTER — Inpatient Hospital Stay (HOSPITAL_COMMUNITY): Payer: 59

## 2023-04-03 DIAGNOSIS — J9601 Acute respiratory failure with hypoxia: Secondary | ICD-10-CM | POA: Diagnosis not present

## 2023-04-03 LAB — RENAL FUNCTION PANEL
Albumin: 2.8 g/dL — ABNORMAL LOW (ref 3.5–5.0)
Anion gap: 8 (ref 5–15)
BUN: 64 mg/dL — ABNORMAL HIGH (ref 8–23)
CO2: 20 mmol/L — ABNORMAL LOW (ref 22–32)
Calcium: 8.1 mg/dL — ABNORMAL LOW (ref 8.9–10.3)
Chloride: 112 mmol/L — ABNORMAL HIGH (ref 98–111)
Creatinine, Ser: 3.45 mg/dL — ABNORMAL HIGH (ref 0.44–1.00)
GFR, Estimated: 14 mL/min — ABNORMAL LOW (ref >60)
Glucose, Bld: 96 mg/dL (ref 70–99)
Phosphorus: 4.5 mg/dL (ref 2.5–4.6)
Potassium: 3.7 mmol/L (ref 3.5–5.1)
Sodium: 140 mmol/L (ref 135–145)

## 2023-04-03 LAB — CBC
HCT: 33 % — ABNORMAL LOW (ref 36.0–46.0)
Hemoglobin: 10.3 g/dL — ABNORMAL LOW (ref 12.0–15.0)
MCH: 31.1 pg (ref 26.0–34.0)
MCHC: 31.2 g/dL (ref 30.0–36.0)
MCV: 99.7 fL (ref 80.0–100.0)
Platelets: 109 10*3/uL — ABNORMAL LOW (ref 150–400)
RBC: 3.31 MIL/uL — ABNORMAL LOW (ref 3.87–5.11)
RDW: 14.4 % (ref 11.5–15.5)
WBC: 16.3 10*3/uL — ABNORMAL HIGH (ref 4.0–10.5)
nRBC: 0 % (ref 0.0–0.2)

## 2023-04-03 LAB — COOXEMETRY PANEL
Carboxyhemoglobin: 2.9 % — ABNORMAL HIGH (ref 0.5–1.5)
Methemoglobin: 0.8 % (ref 0.0–1.5)
O2 Saturation: 74.2 %
Total hemoglobin: 10.7 g/dL — ABNORMAL LOW (ref 12.0–16.0)

## 2023-04-03 LAB — GLUCOSE, CAPILLARY
Glucose-Capillary: 95 mg/dL (ref 70–99)
Glucose-Capillary: 76 mg/dL (ref 70–99)
Glucose-Capillary: 86 mg/dL (ref 70–99)
Glucose-Capillary: 92 mg/dL (ref 70–99)
Glucose-Capillary: 85 mg/dL (ref 70–99)

## 2023-04-03 LAB — MAGNESIUM: Magnesium: 1.9 mg/dL (ref 1.7–2.4)

## 2023-04-03 LAB — URINE CULTURE: Culture: NO GROWTH

## 2023-04-03 LAB — PROCALCITONIN: Procalcitonin: 0.62 ng/mL

## 2023-04-03 MED ORDER — NEOSTIGMINE METHYLSULFATE 10 MG/10ML IV SOLN
1.0000 mg | Freq: Once | INTRAVENOUS | Status: AC
  Administered 2023-04-03: 1 mg via INTRAVENOUS
  Filled 2023-04-03: qty 1

## 2023-04-03 MED ORDER — NEOSTIGMINE METHYLSULFATE 10 MG/10ML IV SOLN
1.0000 mg | Freq: Once | INTRAVENOUS | Status: DC
  Filled 2023-04-03: qty 1

## 2023-04-03 MED ORDER — MAGNESIUM SULFATE 2 GM/50ML IV SOLN
2.0000 g | Freq: Once | INTRAVENOUS | Status: AC
  Administered 2023-04-03: 2 g via INTRAVENOUS
  Filled 2023-04-03: qty 50

## 2023-04-03 MED ORDER — FENTANYL CITRATE PF 50 MCG/ML IJ SOSY
50.0000 ug | PREFILLED_SYRINGE | INTRAMUSCULAR | Status: DC | PRN
  Administered 2023-04-03: 100 ug via INTRAVENOUS
  Administered 2023-04-03: 50 ug via INTRAVENOUS
  Administered 2023-04-03 (×6): 100 ug via INTRAVENOUS
  Administered 2023-04-04: 50 ug via INTRAVENOUS
  Administered 2023-04-04: 100 ug via INTRAVENOUS
  Administered 2023-04-04 (×2): 50 ug via INTRAVENOUS
  Administered 2023-04-04: 100 ug via INTRAVENOUS
  Administered 2023-04-04 (×2): 50 ug via INTRAVENOUS
  Administered 2023-04-04: 200 ug via INTRAVENOUS
  Administered 2023-04-05 (×2): 100 ug via INTRAVENOUS
  Administered 2023-04-05: 200 ug via INTRAVENOUS
  Administered 2023-04-05 – 2023-04-08 (×11): 100 ug via INTRAVENOUS
  Administered 2023-04-10 (×2): 150 ug via INTRAVENOUS
  Administered 2023-04-10 – 2023-04-11 (×5): 100 ug via INTRAVENOUS
  Filled 2023-04-03 (×5): qty 2
  Filled 2023-04-03: qty 1
  Filled 2023-04-03: qty 2
  Filled 2023-04-03: qty 1
  Filled 2023-04-03 (×2): qty 2
  Filled 2023-04-03: qty 1
  Filled 2023-04-03 (×2): qty 2
  Filled 2023-04-03: qty 3
  Filled 2023-04-03: qty 2
  Filled 2023-04-03: qty 4
  Filled 2023-04-03 (×4): qty 2
  Filled 2023-04-03: qty 1
  Filled 2023-04-03: qty 2
  Filled 2023-04-03: qty 1
  Filled 2023-04-03 (×2): qty 2
  Filled 2023-04-03: qty 3
  Filled 2023-04-03 (×2): qty 2
  Filled 2023-04-03: qty 4
  Filled 2023-04-03 (×4): qty 2
  Filled 2023-04-03: qty 4
  Filled 2023-04-03: qty 2

## 2023-04-03 MED ORDER — ATROPINE SULFATE 1 MG/10ML IJ SOSY
1.0000 mg | PREFILLED_SYRINGE | INTRAMUSCULAR | Status: DC | PRN
  Filled 2023-04-03: qty 10

## 2023-04-03 MED ORDER — FENTANYL CITRATE PF 50 MCG/ML IJ SOSY
50.0000 ug | PREFILLED_SYRINGE | INTRAMUSCULAR | Status: AC | PRN
  Administered 2023-04-10 – 2023-04-14 (×3): 50 ug via INTRAVENOUS
  Filled 2023-04-03 (×3): qty 1

## 2023-04-03 MED ORDER — POLYETHYLENE GLYCOL 3350 17 G PO PACK
17.0000 g | PACK | Freq: Every day | ORAL | Status: DC
  Administered 2023-04-03 – 2023-04-18 (×11): 17 g via NASOGASTRIC
  Filled 2023-04-03 (×13): qty 1

## 2023-04-03 MED ORDER — BISACODYL 10 MG RE SUPP
10.0000 mg | Freq: Every day | RECTAL | Status: DC
  Administered 2023-04-03 – 2023-04-05 (×3): 10 mg via RECTAL
  Filled 2023-04-03 (×3): qty 1

## 2023-04-03 MED ORDER — POTASSIUM CHLORIDE 10 MEQ/100ML IV SOLN
10.0000 meq | INTRAVENOUS | Status: AC
  Administered 2023-04-03 (×3): 10 meq via INTRAVENOUS
  Filled 2023-04-03 (×3): qty 100

## 2023-04-03 NOTE — Progress Notes (Signed)
   04/03/23 1055  Adult Ventilator Settings  Vent Mode (S)  PRVC (Ended vent wean due to decreased VE, apnea events. Pt weaned 43 mins. on PSV 15/5.)  Vt Set 450 mL  Set Rate 20 bmp  FiO2 (%) 30 %  PEEP 5 cmH20  Adult Ventilator Measurements  SpO2 99 %

## 2023-04-03 NOTE — Progress Notes (Addendum)
NAME:  Tamara Castro, MRN:  [id number], DOB:  Mar 14, 1961, LOS: 2 ADMISSION DATE:  04/01/2023, CONSULTATION DATE:  04/01/23 REFERRING MD:  Salvadore Dom, CHIEF COMPLAINT:  resp failure   History of Present Illness:  HPI obtained from EMR review as patient is intubated and sedated on MV.   62 yoF with PMH as below who presented to [facility] on 7/5 after 1 week hx of URI symptoms admitted for acute hypoxic and hypercarbic respiratory failure 2/2 RLL PNA requiring intubation, septic shock, AKI and acute heart failure.  Found to have pBNP of 49k with elevated troponin's thought secondary to demand ischemia with echo with normal EF, reduced RV, indeterminate diastolic function.  CTA neg for PE 7/5.  CTH neg 7/9.  Covered with azithromycin/ ceftriaxone, switched to cefepime on 7/7.  Shock and AKI improved and extubated 7/7 but required BiPAP.  Required reintubation 7/10 for respiratory failure.  Increasing WBC 7/13, placed on doxycyline switched to vancomycin with elevated PCT with CXR showing increased bibasilar atelectasis vs infiltrates with increasing pleural effusions.  Initial blood culture x1 w/ stap epi, thought contaminate with repeat BC 7/7 negative.  Some concern for ileus with high OGT residuals with CT a/p showing slightly worse dilation of suspected chronic ileus with prior surgical changes but no transition point, possible emesis of TF since held, remains, NPO on reglan.  Patient has failed SBT for several days, due to apnea despite decreased sedation.  Transferred to [facility] ICU for further care, admitted to PCCM.   Pertinent  Medical History  Tobacco abuse,  COPD, chronic constipation, prior colectomy in 1990's 2/2 diverticulitis, fibromyalgia (on disability since 2008), depression, anxiety, neuropathy  Home meds> xanax (1mg  QID), lexapro, gabapentin, anoro elliptia, zofran  Significant Hospital Events: Including procedures, antibiotic start and stop dates in addition to other pertinent events   7/5  admitted [facility] 7/7 extubated 7/10 reintubated  7/16 tx to [facility] 7/17 attempting lasix. Creatinine worse.   7/18 cr worse after lasix. Weaning but on-going ileus and cr rising presenting obstacle to extubation Trying neostigmine.   7/5-7/7 ctx/ azitro 7/7 vanc> 7/8; 7/14- 7/15 7/7 cefepime> 7/16 7/13 doxy> 7/14  Interim History / Subjective:  Looks comfortable on PSV. Scr rising in spite of lasix. CVP still up but has RV dilation.   Objective   Blood pressure (Abnormal) 148/125, pulse 89, temperature (Abnormal) 96.1 F (35.6 C), temperature source Axillary, resp. rate (Abnormal) 32, height 5\' 5"  (1.651 m), weight 92.3 kg, SpO2 93%. CVP:  [14 mmHg-26 mmHg] 19 mmHg  Vent Mode: PSV;CPAP FiO2 (%):  [30 %] 30 % Set Rate:  [20 bmp] 20 bmp Vt Set:  [450 mL] 450 mL PEEP:  [5 cmH20] 5 cmH20 Pressure Support:  [15 cmH20] 15 cmH20 Plateau Pressure:  [16 cmH20-22 cmH20] 16 cmH20   Intake/Output Summary (Last 24 hours) at 04/03/2023 1050 Last data filed at 04/03/2023 0954 Gross per 24 hour  Intake 1995.79 ml  Output 1175 ml  Net 820.79 ml   Filed Weights   04/01/23 1529 04/02/23 0500 04/03/23 0500  Weight: 91.8 kg 92.3 kg 92.3 kg   Examination: General 62 year old female who remains on mechanical vent HENT NCAT no JVD Pulm dec bases no accessory use. Currently on PSV Card rrr Abd soft  Ext warm, does have pitting edema. Brisk CR Neuro opens eyes to request. Follows some commands.  Gu minimal UOP  Resolved Hospital Problem list   Hypernatremia, resolved Assessment & Plan:   Acute hypoxic and hypercarbic  respiratory failure 2/2 AECOPD, Moraxella PNA, pleural effusions, and r/o HCAP vs new aspiration component  Tobacco abuse Tolerating weaning but worsening renal fxn and on-going ileus barrier to extubation  Plan Cont PSV as tolerated  PAD protocol RASS goal 0 to -1 cont duonebs q4, brovanna, pulmicort, prn albuterol pulmonary hygiene Assess daily for volume removal (scr rising  w/ lasix; perhaps augmentation of RV fxn may help renal perfusion) smoking cessation when appropriate  Sepsis 2/2 PNA.  Now s/p treatment  - abx coverage at OSH 7/5> 7/16, s/p 10 days cefepime, vanc x 3. Had 1 BC positive for staph epi. Think that this was contaminate  Pct neg Wbc improving  Plan Cont to trend CBC, fever curve   Mild to moderate RV dysfunction Acute Heart failure, new onset CAD Elevated troponin's thought secondary to demand ischemia Prolonged QTc - Echo moderately reduced RV, normal EF, indeterminate DD, did not comment on  Plan holding ASA/ statin while NPO; hope to resume soon Cont to trend CVP Check co-oximetry given reduced RV fxn. ? Add inotrope for RV?  optimize electrolytes  OSH CT showing evidence of 3 vessel CAD.  Will need further cardiology workup when critical illness resolved.    AKI, concern for ATN, s/p IV contrast 7/11, concern for cardiorenal renal syndrome -attempted albumin f/b lasix. Scr sig worse. CT abd obtained to eval ileus did not show hydronephrosis  Plan cont foley Hold diuresis today  strict I/Os, daily wts avoid nephrotoxins, renal dose meds, hemodynamic support as above Am chem  Chronic ileus  Hx of  - OSH CT a/p 7/11 chronic dilation of small bowel, likely 2/2 colectomy hx. Seen by surgical team. Felt that this was motility issue and not obstruction Plan Repeat abd film today  Cont reglan  Will try neostigmine (1/2 dose given CrCl) Ensure K > 4 and Mg > 2 Hope trickle feeds next 24 hrs. Will see if can get FT post-pyloric given hx of TF aspiration  Try to limit narcs  Normocytic anemia Thrombocytopenia - ddx sepsis/ critical illness  Both stable w/out evidence of bleeding Plan Intermittent CBC transfuse for Hgb < 7   Anxiety/ depression- benzo dependent Plan holding lexapro and xanax while NPO  Best Practice (right click and "Reselect all SmartList Selections" daily)   Diet/type: NPO DVT prophylaxis:  prophylactic heparin  GI prophylaxis: PPI Lines: Central line Foley:  Yes, and it is still needed Code Status:  full code Last date of multidisciplinary goals of care discussion [pending]  Daughter [name] [phone]> pending My cct 38 minutes   [name] ACNP-BC [facility] Pulmonary/Critical Care Pager # [phone] OR # [phone] if no answer

## 2023-04-03 NOTE — Progress Notes (Addendum)
Patient ID: Tamara Castro, female   DOB: [birth year], 62 y.o.   MRN: [id number] [facility] Surgery Progress Note     Subjective: CC-  On the vent. Per RN patient received and enema last night with no results. She has had 700cc out from OG over the last 24 hours. CT scan reveals adynamic ileus.  Objective: Vital signs in last 24 hours: Temp:  [96.1 F (35.6 C)-97.6 F (36.4 C)] 96.1 F (35.6 C) (07/18 0800) Pulse Rate:  [48-84] 56 (07/18 0800) Resp:  [16-22] 18 (07/18 0800) BP: (117-159)/(56-104) 133/64 (07/18 0800) SpO2:  [94 %-100 %] 96 % (07/18 0810) FiO2 (%):  [30 %] 30 % (07/18 0808) Weight:  [92.3 kg] 92.3 kg (07/18 0500) Last BM Date : 04/01/23  Intake/Output from previous day: 07/17 0701 - 07/18 0700 In: 1955.1 [I.V.:593.6; NG/GT:1210; IV Piggyback:151.5] Out: 1175 [Urine:475; Emesis/NG output:700] Intake/Output this shift: Total I/O In: 28.7 [I.V.:28.7] Out: -   PE: Gen:  on the vent Pulm: mechanically ventilated Abd: soft, mild distension, bowel sounds present, nontender  Lab Results:  Recent Labs    04/02/23 0845 04/02/23 1850 04/03/23 0253  WBC 19.3*  --  16.3*  HGB 10.9* 9.5* 10.3*  HCT 35.0* 30.4* 33.0*  PLT 113*  --  109*   BMET Recent Labs    04/02/23 0533 04/03/23 0253  NA 141 140  K 4.0 3.7  CL 112* 112*  CO2 21* 20*  GLUCOSE 89 96  BUN 59* 64*  CREATININE 2.77* 3.45*  CALCIUM 7.9* 8.1*   PT/INR No results for input(s): "LABPROT", "INR" in the last 72 hours. CMP     Component Value Date/Time   NA 140 04/03/2023 0253   K 3.7 04/03/2023 0253   CL 112 (H) 04/03/2023 0253   CO2 20 (L) 04/03/2023 0253   GLUCOSE 96 04/03/2023 0253   BUN 64 (H) 04/03/2023 0253   CREATININE 3.45 (H) 04/03/2023 0253   CALCIUM 8.1 (L) 04/03/2023 0253   PROT 5.1 (L) 04/01/2023 1602   ALBUMIN 2.8 (L) 04/03/2023 0253   AST 14 (L) 04/01/2023 1602   ALT 17 04/01/2023 1602   ALKPHOS 46 04/01/2023 1602   BILITOT 0.8 04/01/2023 1602   GFRNONAA  14 (L) 04/03/2023 0253   Lipase  No results found for: "LIPASE"     Studies/Results: CT ABDOMEN PELVIS WO CONTRAST  Result Date: 04/02/2023 CLINICAL DATA:  Sepsis ileus vs bowel obstruction EXAM: CT ABDOMEN AND PELVIS WITHOUT CONTRAST TECHNIQUE: Multidetector CT imaging of the abdomen and pelvis was performed following the standard protocol without IV contrast. RADIATION DOSE REDUCTION: This exam was performed according to the departmental dose-optimization program which includes automated exposure control, adjustment of the mA and/or kV according to patient size and/or use of iterative reconstruction technique. COMPARISON:  CT scan abdomen and pelvis from 03/27/2023. FINDINGS: Lower chest: Redemonstration of small-to-moderate bilateral pleural effusions with associated compressive atelectatic changes in the bilateral lower lobes. No significant interval change. There are at least moderate diffuse emphysematous changes in the visualized lungs. Mild smooth interlobular septal thickening noted, favoring congestive heart failure/pulmonary edema. The heart is normal in size. No pericardial effusion. There is apparent hypoattenuation of the blood pool relative to the myocardium, suggestive of anemia. Hepatobiliary: The liver is normal in size. There is subtle nodularity of the inferior right hepatic lobe, nonspecific. No frank cirrhotic changes noted. No suspicious mass. No intrahepatic bile duct dilation. There is mild prominence of the extrahepatic bile duct, most likely due to post  cholecystectomy status. Gallbladder is surgically absent. Pancreas: Unremarkable. No pancreatic ductal dilatation or surrounding inflammatory changes. Spleen: Within normal limits. No focal lesion. Adrenals/Urinary Tract: Adrenal glands are unremarkable. No suspicious renal mass. Stable partially exophytic cyst arising from the right kidney interpolar region, laterally and smaller cyst arising from the left kidney interpolar  region, anteriorly. No hydronephrosis. No renal or ureteric calculi. Urinary bladder is decompressed secondary to Foley catheter, precluding optimal assessment. However, no large mass or stones identified. No perivesical fat stranding. Stomach/Bowel: Stomach is distended with positive oral contrast. An enteric tube is noted with its tip in the midbody region. Duodenum is not dilated. There are multiple proximal to middle small bowel loops, which are not dilated as well as not opacified with positive oral contrast. However, there are multiple loops of dilated featureless bowel loops which measure up to 6.3 cm in diameter. These bowel loops are opacified with hyperattenuating material, likely ingested on prior exam. There is probable end-to-side ileocolonic anastomosis in the posterior pelvis. No abnormal bowel wall thickening or perienteric fat stranding. No discrete transition point seen. 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 Vascular/Lymphatic: No ascites or pneumoperitoneum. No abdominal or pelvic lymphadenopathy, by size criteria. No aneurysmal dilation of the major abdominal arteries. There are mild peripheral atherosclerotic vascular calcifications of the aorta and its major branches. Reproductive: The uterus is surgically absent. No large adnexal mass. Other: Mild-to-moderate anasarca. Musculoskeletal: No suspicious osseous lesions. There are mild multilevel degenerative changes in the visualized spine. IMPRESSION: *Redemonstration of multiple dilated distal small bowel loops without definite transition point. Proximal small bowel loops are nondilated. Findings are grossly similar to the recent prior study and may represent adynamic ileus. *Congestive heart failure/pulmonary edema and bilateral pleural effusions. *Multiple other nonacute observations, as described above. Electronically Signed   By: Jules Schick M.D.   On: 04/02/2023 17:53    ECHOCARDIOGRAM COMPLETE  Result Date: 04/02/2023    ECHOCARDIOGRAM REPORT   Patient Name:   Tamara Castro Date of Exam: 04/02/2023 Medical Rec #:  [id number]           Height:       65.0 in Accession #:    [id number]          Weight:       203.5 lb Date of Birth:  [birth year]          BSA:          1.993 m Patient Age:    61 years            BP:           137/72 mmHg Patient Gender: F                   HR:           64 bpm. Exam Location:  Inpatient Procedure: 2D Echo, Cardiac Doppler and Color Doppler Indications:    Elevated Troponin  History:        Patient has no prior history of Echocardiogram examinations.                 COPD; Risk Factors:Dyslipidemia.  Sonographer:    Harriette Bouillon RDCS Referring Phys: [phone] PAULA B SIMPSON IMPRESSIONS  1. Left ventricular ejection fraction, by estimation, is 60 to 65%. The left ventricle has normal function. The left ventricle has no regional wall motion abnormalities. Left ventricular diastolic parameters were normal.  2. Right  ventricular systolic function is normal. The right ventricular size is normal.  3. The mitral valve is normal in structure. No evidence of mitral valve regurgitation. No evidence of mitral stenosis.  4. The aortic valve is tricuspid. There is mild calcification of the aortic valve. Aortic valve regurgitation is not visualized. No aortic stenosis is present.  5. The inferior vena cava is dilated in size with <50% respiratory variability, suggesting right atrial pressure of 15 mmHg. FINDINGS  Left Ventricle: Left ventricular ejection fraction, by estimation, is 60 to 65%. The left ventricle has normal function. The left ventricle has no regional wall motion abnormalities. The left ventricular internal cavity size was normal in size. There is  no left ventricular hypertrophy. Left ventricular diastolic parameters were normal. Right Ventricle: The right ventricular size is normal. No increase in right ventricular wall thickness. Right  ventricular systolic function is normal. Left Atrium: Left atrial size was normal in size. Right Atrium: Right atrial size was normal in size. Pericardium: There is no evidence of pericardial effusion. Mitral Valve: The mitral valve is normal in structure. No evidence of mitral valve regurgitation. No evidence of mitral valve stenosis. Tricuspid Valve: The tricuspid valve is normal in structure. Tricuspid valve regurgitation is trivial. No evidence of tricuspid stenosis. Aortic Valve: The aortic valve is tricuspid. There is mild calcification of the aortic valve. Aortic valve regurgitation is not visualized. No aortic stenosis is present. Pulmonic Valve: The pulmonic valve was normal in structure. Pulmonic valve regurgitation is trivial. No evidence of pulmonic stenosis. Aorta: The aortic root is normal in size and structure. Venous: The inferior vena cava is dilated in size with less than 50% respiratory variability, suggesting right atrial pressure of 15 mmHg. IAS/Shunts: No atrial level shunt detected by color flow Doppler.  LEFT VENTRICLE PLAX 2D LVIDd:         5.10 cm   Diastology LVIDs:         3.90 cm   LV e' medial:    7.83 cm/s LV PW:         0.70 cm   LV E/e' medial:  10.0 LV IVS:        0.70 cm   LV e' lateral:   7.51 cm/s LVOT diam:     2.10 cm   LV E/e' lateral: 10.5 LV SV:         71 LV SV Index:   35 LVOT Area:     3.46 cm  RIGHT VENTRICLE             IVC RV S prime:     14.70 cm/s  IVC diam: 2.40 cm TAPSE (M-mode): 1.8 cm LEFT ATRIUM             Index        RIGHT ATRIUM           Index LA diam:        2.70 cm 1.35 cm/m   RA Area:     10.10 cm LA Vol (A2C):   35.7 ml 17.92 ml/m  RA Volume:   18.00 ml  9.03 ml/m LA Vol (A4C):   30.5 ml 15.31 ml/m LA Biplane Vol: 35.8 ml 17.97 ml/m  AORTIC VALVE LVOT Vmax:   86.50 cm/s LVOT Vmean:  53.400 cm/s LVOT VTI:    0.204 m  AORTA Ao Root diam: 3.00 cm Ao Asc diam:  2.80 cm MITRAL VALVE MV Area (PHT): 4.06 cm    SHUNTS MV Decel Time: 187 msec  Systemic  VTI:  0.20 m MV E velocity: 78.60 cm/s  Systemic Diam: 2.10 cm MV A velocity: 78.60 cm/s MV E/A ratio:  1.00 Arvilla Meres MD Electronically signed by Arvilla Meres MD Signature Date/Time: 04/02/2023/12:09:52 PM    Final    Portable Chest xray  Result Date: 04/02/2023 CLINICAL DATA:  Respiratory failure. EXAM: PORTABLE CHEST 1 VIEW COMPARISON:  04/01/2023 FINDINGS: The endotracheal tube tip is stable approximately 4.9 cm above the carina. The left IJ catheter tip is in the projection of the mid SVC. Enteric tube has been advanced with tip below the GE junction. Stable cardiomediastinal contours. Advanced changes of emphysema. Persistent opacity within the left lung base which obscures the left hemidiaphragm. IMPRESSION: 1. Stable endotracheal tube. 2. Enteric tube has been advanced with tip below the GE junction. 3. Persistent left basilar opacity which may reflect atelectasis or pneumonia. 4. Advanced emphysema. Electronically Signed   By: Signa Kell M.D.   On: 04/02/2023 08:30   DG Abd 1 View  Result Date: 04/01/2023 CLINICAL DATA:  Confirm OG tube placement. EXAM: ABDOMEN - 1 VIEW COMPARISON:  04/01/2023. FINDINGS: A mildly distended gas-filled loop of colon is noted in the upper abdomen. There is mild gaseous distention of the small bowel measuring 4.0 cm. An enteric tube terminates in the stomach. Mild airspace disease is noted at the lung bases. There is blunting of the costophrenic angles bilaterally suggesting small pleural effusions. Surgical clips are noted in the right upper quadrant. IMPRESSION: 1. Enteric tube terminates in the stomach. 2. Gaseous distention of the colon and small bowel, possible ileus or obstruction. 3. Small bilateral pleural effusions with atelectasis or infiltrate. Electronically Signed   By: Thornell Sartorius M.D.   On: 04/01/2023 23:40   DG Abd 1 View  Result Date: 04/01/2023 CLINICAL DATA:  Confirmation of OG tube placement. EXAM: ABDOMEN - 1 VIEW COMPARISON:   04/01/2023. FINDINGS: Mildly distended gas-filled loop colon is noted in the upper abdomen. An enteric tube terminates in the stomach and appears to be in appropriate position. No radio-opaque calculi. Stable opacities are noted at the lung bases. IMPRESSION: 1. Enteric tube terminates in the stomach. 2. Mildly distended gas-filled loop of colon in the upper abdomen, unchanged. Electronically Signed   By: Thornell Sartorius M.D.   On: 04/01/2023 22:00   DG Abd 1 View  Result Date: 04/01/2023 CLINICAL DATA:  OG tube placement EXAM: ABDOMEN - 1 VIEW COMPARISON:  04/01/2023 FINDINGS: Partially visualized vascular catheter tip at brachiocephalic confluence. Interstitial thickening at the lung bases. Esophageal tube tip overlies the proximal stomach, possible fold or kink in the distal tube. IMPRESSION: Esophageal tube tip overlies the proximal stomach, possible fold or kink in the distal tube. Electronically Signed   By: Jasmine Pang M.D.   On: 04/01/2023 18:20   DG Abd 1 View  Result Date: 04/01/2023 CLINICAL DATA:  Ileus. EXAM: ABDOMEN - 1 VIEW COMPARISON:  Concurrent chest radiographs. Abdominopelvic CT and abdominal radiographs 03/27/2023. FINDINGS: Enteric tube is not visualized on this examination, tip at the level of the midesophagus on concurrent chest radiographs. Tube advancement recommended. In correlation with prior imaging, the patient appears to be status post subtotal colectomy with an ileorectal anastomosis. Enteric contrast has passed into the rectum. Unchanged moderate diffuse small bowel distension. No extravasated enteric contrast identified. Previously questioned possible intraluminal gallstone at the ileorectal anastomosis is not well seen on these images. IMPRESSION: 1. Enteric tube is not visualized on this examination, tip at the level  of the midesophagus on concurrent chest radiographs. Tube advancement recommended. 2. Unchanged moderate diffuse small bowel distension status post subtotal  colectomy and ileorectal anastomosis. Findings could be secondary to an ileus. However, there may be partial obstruction from a suggested intraluminal gallstone at the anastomosis on prior imaging. Electronically Signed   By: Carey Bullocks M.D.   On: 04/01/2023 17:01   DG Chest Port 1 View  Result Date: 04/01/2023 CLINICAL DATA:  Endotracheal tube present. EXAM: PORTABLE CHEST 1 VIEW COMPARISON:  Radiographs 03/31/2023 and 03/29/2023.  CT 03/21/2023. FINDINGS: 1606 hours. Tip of the endotracheal tube overlies the mid trachea. Left IJ central venous catheter projects to the upper SVC. Enteric tube tip projects over the left atrium with the side hole at the level of the aortic arch. The should be advanced at least 20 cm. The heart size and mediastinal contours are stable. Unchanged residual patchy airspace opacities at both lung bases. Underlying emphysema noted. No evidence of pneumothorax or significant pleural effusion. No acute osseous findings are seen. IMPRESSION: 1. Enteric tube tip projects over the left atrium and should be advanced at least 20 cm. 2. No other significant changes from prior study. Persistent bibasilar airspace opacities superimposed on emphysema. Electronically Signed   By: Carey Bullocks M.D.   On: 04/01/2023 16:53    Anti-infectives: Anti-infectives (From admission, onward)    None        Assessment/Plan  Ileus - Hx of abdominal hysterectomy, total colectomy, cholecystectomy  - Repeat CT 7/17 consistent with ileus. No indication for acute surgical intervention. Patient was started on reglan last night. Continue bowel regimen, oral contrast that was given yesterday may also help. Consider GI consult since ileus is a functional/motility issue. We will sign off, please call with questions or concerns.   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    LOS: 2 days    Franne Forts, [facility] Surgery 04/03/2023, 8:51 AM Please see Amion for pager number during day hours 7:00am-4:30pm

## 2023-04-03 NOTE — Progress Notes (Signed)
Co-ox w/in nml Plan  Will keep her euvolemic.  Re-assess chem in am If cr still rising will need nephro consult  [name] ACNP-BC [facility] Pulmonary/Critical Care Pager # [phone] OR # [phone] if no answer

## 2023-04-03 NOTE — Plan of Care (Signed)
  Problem: Education: Goal: Knowledge of General Education information will improve Description: Including pain rating scale, medication(s)/side effects and non-pharmacologic comfort measures Outcome: Progressing   Problem: Health Behavior/Discharge Planning: Goal: Ability to manage health-related needs will improve Outcome: Progressing   Problem: Clinical Measurements: Goal: Ability to maintain clinical measurements within normal limits will improve Outcome: Progressing   Problem: Clinical Measurements: Goal: Will remain free from infection Outcome: Not Progressing Goal: Diagnostic test results will improve Outcome: Not Progressing Goal: Respiratory complications will improve Outcome: Not Progressing Goal: Cardiovascular complication will be avoided Outcome: Not Progressing   Problem: Activity: Goal: Risk for activity intolerance will decrease Outcome: Not Progressing   Problem: Nutrition: Goal: Adequate nutrition will be maintained Outcome: Not Progressing   Problem: Coping: Goal: Level of anxiety will decrease Outcome: Not Progressing   Problem: Elimination: Goal: Will not experience complications related to bowel motility Outcome: Not Progressing Goal: Will not experience complications related to urinary retention Outcome: Not Progressing   Problem: Pain Managment: Goal: General experience of comfort will improve Outcome: Not Progressing   Problem: Safety: Goal: Ability to remain free from injury will improve Outcome: Not Progressing   Problem: Skin Integrity: Goal: Risk for impaired skin integrity will decrease Outcome: Not Progressing   Problem: Activity: Goal: Ability to tolerate increased activity will improve Outcome: Not Progressing   Problem: Respiratory: Goal: Ability to maintain a clear airway and adequate ventilation will improve Outcome: Not Progressing   Problem: Role Relationship: Goal: Method of communication will improve Outcome: Not  Progressing

## 2023-04-04 ENCOUNTER — Inpatient Hospital Stay (HOSPITAL_COMMUNITY): Payer: 59

## 2023-04-04 ENCOUNTER — Encounter (HOSPITAL_COMMUNITY): Payer: Self-pay | Admitting: Pulmonary Disease

## 2023-04-04 ENCOUNTER — Other Ambulatory Visit: Payer: Self-pay

## 2023-04-04 DIAGNOSIS — J9601 Acute respiratory failure with hypoxia: Secondary | ICD-10-CM | POA: Diagnosis not present

## 2023-04-04 DIAGNOSIS — Z9911 Dependence on respirator [ventilator] status: Secondary | ICD-10-CM | POA: Diagnosis not present

## 2023-04-04 LAB — BRAIN NATRIURETIC PEPTIDE: B Natriuretic Peptide: 483.2 pg/mL — ABNORMAL HIGH (ref 0.0–100.0)

## 2023-04-04 LAB — CBC
HCT: 32.1 % — ABNORMAL LOW (ref 36.0–46.0)
Hemoglobin: 10.1 g/dL — ABNORMAL LOW (ref 12.0–15.0)
MCH: 30.9 pg (ref 26.0–34.0)
MCHC: 31.5 g/dL (ref 30.0–36.0)
MCV: 98.2 fL (ref 80.0–100.0)
Platelets: 104 10*3/uL — ABNORMAL LOW (ref 150–400)
RBC: 3.27 MIL/uL — ABNORMAL LOW (ref 3.87–5.11)
RDW: 14.1 % (ref 11.5–15.5)
WBC: 16.2 10*3/uL — ABNORMAL HIGH (ref 4.0–10.5)
nRBC: 0 % (ref 0.0–0.2)

## 2023-04-04 LAB — RENAL FUNCTION PANEL
Albumin: 2.5 g/dL — ABNORMAL LOW (ref 3.5–5.0)
Anion gap: 12 (ref 5–15)
BUN: 67 mg/dL — ABNORMAL HIGH (ref 8–23)
CO2: 16 mmol/L — ABNORMAL LOW (ref 22–32)
Calcium: 8 mg/dL — ABNORMAL LOW (ref 8.9–10.3)
Chloride: 108 mmol/L (ref 98–111)
Creatinine, Ser: 3.84 mg/dL — ABNORMAL HIGH (ref 0.44–1.00)
GFR, Estimated: 13 mL/min — ABNORMAL LOW (ref >60)
Glucose, Bld: 97 mg/dL (ref 70–99)
Phosphorus: 4.3 mg/dL (ref 2.5–4.6)
Potassium: 4 mmol/L (ref 3.5–5.1)
Sodium: 136 mmol/L (ref 135–145)

## 2023-04-04 LAB — GLUCOSE, CAPILLARY
Glucose-Capillary: 111 mg/dL — ABNORMAL HIGH (ref 70–99)
Glucose-Capillary: 76 mg/dL (ref 70–99)
Glucose-Capillary: 82 mg/dL (ref 70–99)
Glucose-Capillary: 84 mg/dL (ref 70–99)
Glucose-Capillary: 93 mg/dL (ref 70–99)
Glucose-Capillary: 93 mg/dL (ref 70–99)

## 2023-04-04 LAB — HEPARIN ANTI-XA: Heparin LMW: 0.12 IU/mL

## 2023-04-04 LAB — PROCALCITONIN: Procalcitonin: 0.82 ng/mL

## 2023-04-04 MED ORDER — SODIUM CHLORIDE 0.9 % FOR CRRT: INTRAVENOUS_CENTRAL | Status: DC | PRN

## 2023-04-04 MED ORDER — MIDAZOLAM HCL 2 MG/2ML IJ SOLN
INTRAMUSCULAR | Status: AC
  Administered 2023-04-04: 2 mg via INTRAVENOUS
  Filled 2023-04-04: qty 2

## 2023-04-04 MED ORDER — PRISMASOL BGK 4/2.5 32-4-2.5 MEQ/L REPLACEMENT SOLN: Status: DC

## 2023-04-04 MED ORDER — PIVOT 1.5 CAL PO LIQD
1000.0000 mL | ORAL | Status: DC
  Administered 2023-04-04: 1000 mL
  Filled 2023-04-04 (×2): qty 1000

## 2023-04-04 MED ORDER — HEPARIN SODIUM (PORCINE) 5000 UNIT/ML IJ SOLN
5000.0000 [IU] | Freq: Three times a day (TID) | INTRAMUSCULAR | Status: DC
  Filled 2023-04-04: qty 1

## 2023-04-04 MED ORDER — ESCITALOPRAM OXALATE 10 MG PO TABS
10.0000 mg | ORAL_TABLET | Freq: Every day | ORAL | Status: DC
  Administered 2023-04-04 – 2023-04-28 (×24): 10 mg
  Filled 2023-04-04 (×23): qty 1

## 2023-04-04 MED ORDER — ALPRAZOLAM 0.5 MG PO TABS
0.5000 mg | ORAL_TABLET | Freq: Two times a day (BID) | ORAL | Status: DC
  Administered 2023-04-04: 0.5 mg

## 2023-04-04 MED ORDER — MIDAZOLAM HCL 2 MG/2ML IJ SOLN
1.0000 mg | INTRAMUSCULAR | Status: DC | PRN
  Administered 2023-04-04 – 2023-04-05 (×16): 1 mg via INTRAVENOUS
  Filled 2023-04-04 (×14): qty 2

## 2023-04-04 MED ORDER — SODIUM CHLORIDE 0.9 % IV SOLN
500.0000 [IU]/h | INTRAVENOUS | Status: DC
  Administered 2023-04-04 (×2): 500 [IU]/h via INTRAVENOUS_CENTRAL
  Administered 2023-04-05: 1000 [IU]/h via INTRAVENOUS_CENTRAL
  Filled 2023-04-04: qty 10000
  Filled 2023-04-04: qty 2

## 2023-04-04 MED ORDER — ALPRAZOLAM 0.5 MG PO TABS
ORAL_TABLET | ORAL | Status: AC
  Filled 2023-04-04: qty 1

## 2023-04-04 MED ORDER — PRISMASOL BGK 4/2.5 32-4-2.5 MEQ/L EC SOLN: Status: DC

## 2023-04-04 MED ORDER — NOREPINEPHRINE 16 MG/250ML-% IV SOLN
0.0000 ug/min | INTRAVENOUS | Status: DC
  Administered 2023-04-05 (×2): 2 ug/min via INTRAVENOUS
  Administered 2023-04-08: 10 ug/min via INTRAVENOUS
  Filled 2023-04-04 (×2): qty 250

## 2023-04-04 MED ORDER — MIDAZOLAM HCL 2 MG/2ML IJ SOLN: 2.0000 mg | Freq: Once | INTRAMUSCULAR | Status: AC

## 2023-04-04 MED ORDER — DEXMEDETOMIDINE HCL IN NACL 400 MCG/100ML IV SOLN
0.0000 ug/kg/h | INTRAVENOUS | Status: DC
  Administered 2023-04-04 – 2023-04-06 (×12): 1.2 ug/kg/h via INTRAVENOUS
  Administered 2023-04-06: 1.1 ug/kg/h via INTRAVENOUS
  Administered 2023-04-06 – 2023-04-07 (×2): 1.2 ug/kg/h via INTRAVENOUS
  Administered 2023-04-07: 1 ug/kg/h via INTRAVENOUS
  Administered 2023-04-07: 0.8 ug/kg/h via INTRAVENOUS
  Administered 2023-04-07: 0.9 ug/kg/h via INTRAVENOUS
  Administered 2023-04-07: 0.4 ug/kg/h via INTRAVENOUS
  Administered 2023-04-08: 0.6 ug/kg/h via INTRAVENOUS
  Administered 2023-04-08: 0.4 ug/kg/h via INTRAVENOUS
  Administered 2023-04-09 (×2): 0.9 ug/kg/h via INTRAVENOUS
  Administered 2023-04-09: 0.4 ug/kg/h via INTRAVENOUS
  Administered 2023-04-09 – 2023-04-10 (×2): 0.9 ug/kg/h via INTRAVENOUS
  Administered 2023-04-10: 0.5 ug/kg/h via INTRAVENOUS
  Filled 2023-04-04 (×13): qty 100
  Filled 2023-04-04: qty 200
  Filled 2023-04-04 (×12): qty 100

## 2023-04-04 MED ORDER — ASPIRIN 81 MG PO CHEW
CHEWABLE_TABLET | ORAL | Status: AC
  Filled 2023-04-04: qty 1

## 2023-04-04 MED ORDER — HEPARIN SODIUM (PORCINE) 5000 UNIT/ML IJ SOLN
5000.0000 [IU] | Freq: Two times a day (BID) | INTRAMUSCULAR | Status: DC
  Administered 2023-04-04 – 2023-04-05 (×2): 5000 [IU] via SUBCUTANEOUS
  Filled 2023-04-04: qty 1

## 2023-04-04 MED ORDER — ESCITALOPRAM OXALATE 20 MG PO TABS
ORAL_TABLET | ORAL | Status: AC
  Filled 2023-04-04: qty 1

## 2023-04-04 MED ORDER — HEPARIN SODIUM (PORCINE) 1000 UNIT/ML DIALYSIS
1000.0000 [IU] | INTRAMUSCULAR | Status: DC | PRN
  Filled 2023-04-04: qty 6

## 2023-04-04 MED ORDER — PNEUMOCOCCAL 20-VAL CONJ VACC 0.5 ML IM SUSY: 0.5000 mL | PREFILLED_SYRINGE | INTRAMUSCULAR | Status: DC | PRN

## 2023-04-04 MED ORDER — ATORVASTATIN CALCIUM 10 MG PO TABS
ORAL_TABLET | ORAL | Status: AC
  Filled 2023-04-04: qty 2

## 2023-04-04 MED ORDER — NOREPINEPHRINE 4 MG/250ML-% IV SOLN: 0.0000 ug/min | INTRAVENOUS | Status: DC

## 2023-04-04 MED ORDER — ASPIRIN 81 MG PO CHEW
81.0000 mg | CHEWABLE_TABLET | Freq: Every day | ORAL | Status: DC
  Administered 2023-04-04 – 2023-04-16 (×13): 81 mg
  Filled 2023-04-04 (×12): qty 1

## 2023-04-04 MED ORDER — MIDAZOLAM HCL 2 MG/2ML IJ SOLN
INTRAMUSCULAR | Status: AC
  Administered 2023-04-04: 1 mg via INTRAVENOUS
  Filled 2023-04-04: qty 2

## 2023-04-04 MED ORDER — ATORVASTATIN CALCIUM 40 MG PO TABS
20.0000 mg | ORAL_TABLET | Freq: Every day | ORAL | Status: DC
  Administered 2023-04-04: 20 mg via ORAL

## 2023-04-04 MED ORDER — MIDAZOLAM HCL 2 MG/2ML IJ SOLN
2.0000 mg | Freq: Once | INTRAMUSCULAR | Status: AC
  Administered 2023-04-04 (×2): 1 mg via INTRAVENOUS

## 2023-04-04 MED ORDER — ALPRAZOLAM 0.5 MG PO TABS
0.5000 mg | ORAL_TABLET | Freq: Three times a day (TID) | ORAL | Status: DC
  Administered 2023-04-04 – 2023-04-07 (×10): 0.5 mg
  Filled 2023-04-04 (×10): qty 1

## 2023-04-04 MED ORDER — CHLORHEXIDINE GLUCONATE CLOTH 2 % EX PADS
6.0000 | MEDICATED_PAD | Freq: Every day | CUTANEOUS | Status: DC
  Administered 2023-04-05 – 2023-04-11 (×6): 6 via TOPICAL

## 2023-04-04 NOTE — Progress Notes (Addendum)
NAME:  Tamara Castro, MRN:  [id number], DOB:  [birth year], LOS: 3 ADMISSION DATE:  04/01/2023, CONSULTATION DATE:  04/01/23 REFERRING MD:  Salvadore Dom, CHIEF COMPLAINT:  resp failure   History of Present Illness:  HPI obtained from EMR review as patient is intubated and sedated on MV.   28 yoF with PMH as below who presented to [facility] on 7/5 after 1 week hx of URI symptoms admitted for acute hypoxic and hypercarbic respiratory failure 2/2 RLL PNA requiring intubation, septic shock, AKI and acute heart failure.  Found to have pBNP of 49k with elevated troponin's thought secondary to demand ischemia with echo with normal EF, reduced RV, indeterminate diastolic function.  CTA neg for PE 7/5.  CTH neg 7/9.  Covered with azithromycin/ ceftriaxone, switched to cefepime on 7/7.  Shock and AKI improved and extubated 7/7 but required BiPAP.  Required reintubation 7/10 for respiratory failure.  Increasing WBC 7/13, placed on doxycyline switched to vancomycin with elevated PCT with CXR showing increased bibasilar atelectasis vs infiltrates with increasing pleural effusions.  Initial blood culture x1 w/ stap epi, thought contaminate with repeat BC 7/7 negative.  Some concern for ileus with high OGT residuals with CT a/p showing slightly worse dilation of suspected chronic ileus with prior surgical changes but no transition point, possible emesis of TF since held, remains, NPO on reglan.  Patient has failed SBT for several days, due to apnea despite decreased sedation.  Transferred to [facility] ICU for further care, admitted to PCCM.   Pertinent  Medical History  Tobacco abuse,  COPD, chronic constipation, prior colectomy in 1990's 2/2 diverticulitis, fibromyalgia (on disability since 2008), depression, anxiety, neuropathy  Home meds> xanax (1mg  QID), lexapro, gabapentin, anoro elliptia, zofran  Significant Hospital Events: Including procedures, antibiotic start and stop dates in addition to other pertinent events   7/5  admitted [facility] CTX and azith started 7/7 extubated. Vanc added and cefepime started; CTX stopped 7/8 vanc stopped 7/10 reintubated  7/16 tx to [facility]. Cefepime stopped 7/17 attempting lasix. Creatinine worse.  Echo EF 60-65%no WMA RV nml  7/18 cr worse after lasix. Weaning but on-going ileus and cr rising presenting obstacle to extubation Trying neostigmine.  7/19 scr worse in spite of holding lasix. Still on low dose precedex. Mental status and renal fxn still barrier to extubation. Nephro consulted as scr cont to rise and acidosis worse    Interim History / Subjective:  Sedated   Objective   Blood pressure 133/75, pulse 61, temperature (Abnormal) 96.9 F (36.1 C), temperature source Oral, resp. rate 19, height 5\' 5"  (1.651 m), weight 92.3 kg, SpO2 99%. CVP:  [13 mmHg-27 mmHg] 13 mmHg  Vent Mode: PRVC FiO2 (%):  [30 %] 30 % Set Rate:  [20 bmp] 20 bmp Vt Set:  [450 mL-490 mL] 490 mL PEEP:  [5 cmH20] 5 cmH20 Pressure Support:  [15 cmH20] 15 cmH20 Plateau Pressure:  [16 cmH20] 16 cmH20   Intake/Output Summary (Last 24 hours) at 04/04/2023 1003 Last data filed at 04/04/2023 0800 Gross per 24 hour  Intake 762.09 ml  Output 229 ml  Net 533.09 ml   Filed Weights   04/01/23 1529 04/02/23 0500 04/03/23 0500  Weight: 91.8 kg 92.3 kg 92.3 kg   Examination: General 62 year old female currently sedated on vent  HENT NCAT no JVD. MMM orally intubated.  Pulm some scattered rhonchi. Dec bases. PCXR w/ bibasilar atx and effusions Card rrr Abd soft  Ext warm + LE edema  Neuro awakens  w/ stim will follow commands at times no focal motor def  Resolved Hospital Problem list   Hypernatremia, resolved Sepsis & septic shock 2/2 moraxella pneumonia Contaminated blood culture X 1   Assessment & Plan:   Acute hypoxic and hypercarbic respiratory failure 2/2 AECOPD, Moraxella PNA, pleural effusions, and r/o HCAP vs new aspiration component  Tobacco abuse Tolerating weaning but worsening renal fxn  and on-going barrier to extubation  Plan Cont PSV as tolerated ->no rush to wean today given multiple interventions  PAD protocol RASS goal 0 to -1 cont duonebs q4, brovanna, pulmicort, prn albuterol pulmonary hygiene Needs vol removal  smoking cessation when appropriate Now day 9 on vent will need to see how she does over weekend. May need to consider trach mid week  Leukocytosis w/out fever. PCT reassuring Plan Cont to hold off abx Repeat PCT and CBC am  Culture if spikes fever   CAD Elevated troponin's thought secondary to demand ischemia Prolonged QTc Acute HF was entered on error on 7/18 Plan resume ASA/ statin VT Cont tele   AKI, concern for shock related ATN complicated further by  IV contrast 7/11; now w/ progressive metabolic acidosis  -attempted albumin f/b lasix. Scr sig worse. CT abd obtained to eval ileus did not show hydronephrosis  Plan cont foley Nephro consulted  Will decide more aggressive diuretic regimen and bicarb replacement vs dialysis   Chronic ileus -->improved  aspiration Hx of  - OSH CT a/p 7/11 chronic dilation of small bowel, likely 2/2 colectomy hx. Seen by surgical team. Felt that this was motility issue and not obstruction. Responded to Neostigmine but cxr still w/ sig bowel distention  Plan Repeat abd film today see if TF has migrated to post-pyloric. If not will ask that radiology advance  Cont reglan Ensure K > 4 and Mg > 2 Start trickle tubefeeds once tube post-pyloric   Normocytic anemia Thrombocytopenia - ddx sepsis/ critical illness  Both stable w/out evidence of bleeding Plan Intermittent CBC transfuse for Hgb < 7  Anxiety/ depression- benzo dependent Plan Can resume her lexapro and xanax   Best Practice (right click and "Reselect all SmartList Selections" daily)   Diet/type: NPO start trickle feed after post pyloric 7/19 DVT prophylaxis: prophylactic heparin  GI prophylaxis: PPI Lines: Central line Foley:  Yes, and it  is still needed Code Status:  full code Last date of multidisciplinary goals of care discussion [pending]  Daughter [name] [phone]> pending My cct 43 min   [name] ACNP-BC [facility] Pulmonary/Critical Care Pager # [phone] OR # [phone] if no answer

## 2023-04-04 NOTE — Progress Notes (Addendum)
Now has CVL for CRRT.  Reviewed note from rads. They were unable to advance the feeding tube post pyloric Plan Will start CRRT tonight Start trickle feeds tonight Cont care as outlined.  Increasing xanax and adding PRN versed (low dose) for increased agitation   [name] ACNP-BC [facility] Pulmonary/Critical Care Pager # [phone] OR # [phone] if no answer

## 2023-04-04 NOTE — Progress Notes (Signed)
   04/04/23 1216  Vent Select  Invasive or Noninvasive Invasive  Adult Vent Y  Airway 7.5 mm  Placement Date/Time: 04/01/23 1511   Inserted prior to hospital arrival?: Placed at another facility  Difficult airway due to:: Difficulty was unanticipated  Placed By: (c) Other (Comment)  ETT Types: Oral  Size (mm): 7.5 mm  Cuffed: Cuffed  Secured a...  Secured at (cm) 23 cm  Measured From [employer]  Secured By [employer]  Adult Ventilator Settings  Vent Type Servo i  Vent Mode (S)  PSV;CPAP (PSV 10/5. Goal as tolerated. Family at bedside.)  FiO2 (%) 30 %  Pressure Support (S)  10 cmH20  PEEP (S)  5 cmH20  Adult Ventilator Measurements  Peak Airway Pressure 16 L/min  Mean Airway Pressure 8 cmH20  Resp Rate Spontaneous 29 br/min  Resp Rate Total 29 br/min  Exhaled Vt 371 mL  Measured Ve 11.1 L  Total PEEP 5 cmH20  SpO2 97 %  Adult Ventilator Alarms  Alarms On Y  Ve High Alarm 18 L/min  Ve Low Alarm 6 L/min  Resp Rate High Alarm 35 br/min  Resp Rate Low Alarm 8  PEEP Low Alarm 3 cmH2O  Press High Alarm 45 cmH2O  T Apnea 20 sec(s)  VAP Prevention  HOB> 30 Degrees Y

## 2023-04-04 NOTE — Progress Notes (Signed)
Nutrition Follow-up  DOCUMENTATION CODES:   Not applicable  INTERVENTION:  - Per CCM notes, plan to try and start trickle tube feeds today after feeding tube advanced post-pyloric.  - Monitor magnesium, potassium, and phosphorus BID for at least 3 days, MD to replete as needed, as pt is at risk for refeeding syndrome given minimal/no nutrition for at least 6 days.  - Once able to advance past trickle, would recommend below TF regimen: Pivot 1.5 at 50 ml/h (1200 ml per day) *Recommend starting at 82mL/hr and advancing by 10mL Q12H due to refeeding risk Provides 1800 kcal, 112 gm protein, 900 ml free water daily  - FWF per CCM/MD.   - Monitor weight trends.  NUTRITION DIAGNOSIS:   Inadequate oral intake related to inability to eat as evidenced by NPO status. *ongoing  GOAL:   Patient will meet greater than or equal to 90% of their needs *unmet  MONITOR:   Vent status, Labs, Weight trends  REASON FOR ASSESSMENT:   Ventilator    ASSESSMENT:   62 y.o. female with PMH COPD, chronic constipation, colectomy (in the 1990's) due to diverticulitis, fibromyalgia, depression who initially presented to [facility] for 1 week hx of URI symptoms admitted for acute hypoxic and hypercarbic respiratory failure 2/2 RLL PNA requiring intubation, septic shock, AKI and acute heart failure.    Shock and AKI improved and extubated 7/7 but required BiPAP.  Required reintubation 7/10 for respiratory failure.    Some concern for ileus with high OGT residuals with CT a/p showing slightly worse dilation of suspected chronic ileus with prior surgical changes. Possible emesis of TF since held, remains NPO on reglan.  Patient has failed SBT for several days. Transferred to [facility] ICU for further care.  7/5: admitted [facility] 7/7: extubated 7/10: reintubated  7/16: tx to [facility], remains intubated  7/18: OGT replaced with small bore NG feeding tube  Patient remains intubated, no family at bedside at time of visit. Now  on day 9 of intubation.  Per discussion with RN, plan to try and take patient to IR today to have feeding tube advanced post pyloric. Will then likely be able to start tube feeds. Abdomen remains soft despite no bowel movement for several days.   CMM note as details plan as above. Likely to start trickle feeds after tube advanced post pyloric (consult placed this AM for IR advancement of current tube to post-pyloric.) Plan to also have Nephrology see patient as creatinine continues to rise. Recs currently pending. Patient noted to be tolerating vent weaning but renal function remains a barrier to extubation.  Suspect patient at risk for refeeding syndrome due to several days NPO. Per [facility] notes, patient has not received EN since 7/13 (6 days).    Admit weight: 202# Current weight: 203#  Medications reviewed and include: Dulcolax daily, Colace, Miralax, Reglan  Labs reviewed:  Creatinine 3.84   NUTRITION - FOCUSED PHYSICAL EXAM:  Flowsheet Row Most Recent Value  Orbital Region No depletion  Upper Arm Region No depletion  Thoracic and Lumbar Region No depletion  Buccal Region Unable to assess  Temple Region Mild depletion  Clavicle Bone Region No depletion  Clavicle and Acromion Bone Region No depletion  Scapular Bone Region Unable to assess  Dorsal Hand No depletion  Patellar Region No depletion  Anterior Thigh Region No depletion  Posterior Calf Region No depletion  Edema (RD Assessment) Mild  Hair Reviewed  Eyes Unable to assess  Mouth Unable to assess  Skin Reviewed  Nails Reviewed       Diet Order:   Diet Order             Diet NPO time specified  Diet effective now                   EDUCATION NEEDS:  Not appropriate for education at this time  Skin:  Skin Assessment: Reviewed RN Assessment Skin Integrity Issues:: Stage I Stage I: Left buttocks  Last BM:  7/16  Height:  Ht Readings from Last 1 Encounters:  04/01/23 5\' 5"  (1.651 m)   Weight:   Wt Readings from Last 1 Encounters:  04/03/23 92.3 kg   Ideal Body Weight:  56.82 kg  BMI:  Body mass index is 33.86 kg/m.  Estimated Nutritional Needs:  Kcal:  1800-2000 kcals Protein:  95-115 grams Fluid:  >/= 1.8L    Shelle Iron RD, LDN For contact information, refer to [facility].

## 2023-04-04 NOTE — Progress Notes (Signed)
eLink Physician-Brief Progress Note Patient Name: Tamara Castro DOB: June 19, 1961 MRN: [id number]   Date of Service  04/04/2023  HPI/Events of Note  Patient with hypotension on CRRT.  eICU Interventions  Arterial line + Levophed gtt ordered.        Thomasene Lot  04/04/2023, 8:12 PM

## 2023-04-04 NOTE — Progress Notes (Signed)
   04/04/23 1323  Adult Ventilator Settings  Vent Mode (S)  PRVC (Placed back on full support due to increase in WOB, desaturation to 90%. Total PSV 10/5 wean: 1 hr.)  Vt Set 490 mL  Set Rate 20 bmp  FiO2 (%) 30 %  PEEP 5 cmH20  Adult Ventilator Measurements  SpO2 (!) 82 %

## 2023-04-04 NOTE — Procedures (Signed)
Pt was transported via vent, full support, full E cylinder, from ICU 1233 to IR and back, no respiratory distress noted, ETT stable.

## 2023-04-04 NOTE — Plan of Care (Signed)
  Problem: Education: Goal: Knowledge of General Education information will improve Description Including pain rating scale, medication(s)/side effects and non-pharmacologic comfort measures Outcome: Not Progressing   Problem: Health Behavior/Discharge Planning: Goal: Ability to manage health-related needs will improve Outcome: Not Progressing   Problem: Clinical Measurements: Goal: Respiratory complications will improve Outcome: Progressing   Problem: Clinical Measurements: Goal: Cardiovascular complication will be avoided Outcome: Progressing

## 2023-04-04 NOTE — Progress Notes (Signed)
   04/04/23 1216  Airway Suctioning/Secretions  Suction Device  (S)  Catheter (Collected culture, gram stain, sent to lab.)

## 2023-04-04 NOTE — Consult Note (Signed)
[facility] Nephrology Consultation Note  Requesting MD: Dr. Chestine Spore, Vernona Rieger Reason for consult: AKI  HPI:  Tamara Castro is a 62 y.o. female with past medical history significant for COPD, fibromyalgia, anxiety depression, colectomy in 1990s due to diverticulitis who was initially presented to [facility] on 7/5 for URI symptoms, admitted for acute hypoxic and hypercapnic respiratory failure due to pneumonia requiring intubation, septic shock, seen as a consultation for the evaluation and management of anuric AKI. The patient was found to have elevated BNP, elevated troponin level due to demand ischemia with echo showing normal EF.  She had CT angio done on 7/5 with negative for PE.  Initial antibiotics was azithromycin and ceftriaxone which was later switched to cefepime.  The patient was initially extubated and then later required intubation for respiratory failure.  Initial blood culture was showing a staph epi thought to be due to contamination and then repeat culture on 7/7 was negative.  The hospital course was complicated by ileus and worsening renal failure.  The patient was then transferred to [facility], ICU for further care.  The baseline serum creatinine level seems to be around 1.04 back in 09/2021.  On arrival to ICU on 04/01/2023 the creatinine level was 2.26 which is progressively worsened to 3.84 today.  Patient has received IV Lasix intermittently because of anuria and fluid overload without much response. The urine output is around 30 cc since this morning and now it is 5 PM.  She is currently intubated, sedated. Urine test was probably contamination and with catheter. CT scan ruled out hydronephrosis. I have discussed with ICU providers, nurse and also called patient's daughter to discuss further plan including CRRT.  PMHx:   Past Medical History:  Diagnosis Date   Anxiety    Asthma    Bladder polyps    per pt, urethral polyps   COPD (chronic obstructive  pulmonary disease) (HCC)    Dysplasia of cervix    Hyperlipidemia    Osteoarthritis     Past Surgical History:  Procedure Laterality Date   ABDOMINAL HYSTERECTOMY     CHOLECYSTECTOMY     TONSILLECTOMY Bilateral    TOTAL COLECTOMY  1998    Family Hx: No family history on file.  Social History:  reports that she has been smoking cigarettes. She has a 40 pack-year smoking history. She has never used smokeless tobacco. She reports that she does not currently use alcohol. She reports that she does not currently use drugs.  Allergies:  Allergies  Allergen Reactions   Ciprofloxacin Diarrhea    Per pt, this medication gave her c-diff    Medications: Prior to Admission medications   Medication Sig Start Date End Date Taking? Authorizing Provider  albuterol (ACCUNEB) 0.63 MG/3ML nebulizer solution Take 1 ampule by nebulization every 4 (four) hours as needed for wheezing or shortness of breath. 09/17/21  Yes [provider]  ALPRAZolam Prudy Feeler) 1 MG tablet Take 1 mg by mouth 4 (four) times daily. 03/15/21  Yes [provider]  ANORO ELLIPTA 62.5-25 MCG/INH AEPB Inhale 1 puff into the lungs daily. 03/13/21  Yes [provider]  diphenhydrAMINE HCl, Sleep, (ZZZQUIL PO) Take 2 tablets by mouth at bedtime. Pt takes 2 gummies every night   Yes [provider]  escitalopram (LEXAPRO) 10 MG tablet Take 10 mg by mouth 2 (two) times daily. 12/16/20  Yes [provider]  gabapentin (NEURONTIN) 400 MG capsule Take 800 mg by mouth 3 (three) times daily as needed.  03/13/21  Yes [provider]  ondansetron (ZOFRAN) 4 MG tablet Take 4 mg by mouth every 8 (eight) hours as needed for nausea or vomiting. 02/20/23  Yes [provider]  PROAIR HFA 108 (90 Base) MCG/ACT inhaler Inhale 2 puffs into the lungs 4 (four) times daily. 03/13/21  Yes [provider]    I have reviewed the patient's current medications.  Labs: Renal Panel: Recent Labs   Lab 04/01/23 1602 04/02/23 0533 04/03/23 0253 04/04/23 0500  NA 143 141 140 136  K 3.5 4.0 3.7 4.0  CL 111 112* 112* 108  CO2 24 21* 20* 16*  GLUCOSE 93 89 96 97  BUN 53* 59* 64* 67*  CREATININE 2.26* 2.77* 3.45* 3.84*  CALCIUM 8.0* 7.9* 8.1* 8.0*  MG 2.1 2.0 1.9  --   PHOS 3.3  --  4.5 4.3     CBC:    Latest Ref Rng & Units 04/04/2023    5:00 AM 04/03/2023    2:53 AM 04/02/2023    6:50 PM  CBC  WBC 4.0 - 10.5 K/uL 16.2  16.3    Hemoglobin 12.0 - 15.0 g/dL 54.0  98.1  9.5   Hematocrit 36.0 - 46.0 % 32.1  33.0  30.4   Platelets 150 - 400 K/uL 104  109       Anemia Panel:  Recent Labs    04/01/23 1602 04/02/23 0845 04/02/23 1850 04/03/23 0253 04/04/23 0500  HGB 11.1* 10.9* 9.5* 10.3* 10.1*  MCV 100.0 99.7  --  99.7 98.2    Recent Labs  Lab 04/01/23 1602 04/03/23 0253 04/04/23 0500  AST 14*  --   --   ALT 17  --   --   ALKPHOS 46  --   --   BILITOT 0.8  --   --   PROT 5.1*  --   --   ALBUMIN 2.2* 2.8* 2.5*    No results found for: "HGBA1C"  ROS: Unable to obtain review of system because patient is sedated and currently intubated.  Physical Exam: Vitals:   04/04/23 1531 04/04/23 1600  BP:  137/77  Pulse:  62  Resp:  20  Temp:    SpO2: 100% 97%     General exam: Ill looking female sedated, intubated Respiratory system: Coarse breath sound bilateral, on mechanical ventilation. Cardiovascular system: S1 & S2 heard, RRR.  Gastrointestinal system: Abdomen is  soft and nontender. Central nervous system: Sedated Extremities: No cyanosis LE edema ++ Skin: No rashes, lesions or ulcers Psychiatry: Currently sedated therefore unable to review.  Assessment/Plan:  # Acute kidney injury, anuric, likely ischemic ATN in the setting of pneumonia, IV contrast with intermittent use of diuretics causing hemodynamic changes. Patient is anuric despite of using diuretics.  Bladder scan with around 200 cc of urine.  CT scan ruled out hydronephrosis.  Exam  consistent with fluid overload. At this time, she will benefit from starting dialysis, CRRT.  I have called patient's daughter and reviewed the plan and she agreed with the placement of HD catheter and start CRRT. All 4K bath, UF around 100 cc an hour.  Heparin for anticoagulation fixed dose.  # Acute hypoxic and hypercapnic respiratory failure due to COPD exacerbation, pneumonia: Currently intubated and management per primary team/pulmonary team.  # Volume/lower extremity edema: Try to UF with HD.  # Anemia: Hemoglobin stable.  Continue to monitor.  # Metabolic acidosis: Starting dialysis.  Discussed with ICU provider, nurse and patient's daughter. Thank you for  the consult.  We will continue to follow.    Jaynie Collins 04/04/2023, 4:11 PM  [facility].

## 2023-04-04 NOTE — Progress Notes (Signed)
eLink Physician-Brief Progress Note Patient Name: Tamara Castro DOB: 1961-03-24 MRN: [id number]   Date of Service  04/04/2023  HPI/Events of Note  Patient having some oozing of blood around her peripheral IV.  eICU Interventions  SQ Heparin changed to 5000 units Q 12 instead of Q 8, anti X a level ordered.        Migdalia Dk 04/04/2023, 8:47 PM

## 2023-04-04 NOTE — Procedures (Signed)
Arterial Catheter Insertion Procedure Note  Tamara Castro  [id number]  [birth year]  Date:04/04/23  Time:8:52 PM    Provider Performing: Hassan Buckler    Procedure: Insertion of Arterial Line (40981) without US guidance  Indication(s) Blood pressure monitoring and/or need for frequent ABGs  Consent Unable to obtain consent due to emergent nature of procedure.  Anesthesia None   Time Out Verified patient identification, verified procedure, site/side was marked, verified correct patient position, special equipment/implants available, medications/allergies/relevant history reviewed, required imaging and test results available.   Sterile Technique Maximal sterile technique including full sterile barrier drape, hand hygiene, sterile gown, sterile gloves, mask, hair covering, sterile ultrasound probe cover (if used).   Procedure Description Area of catheter insertion was cleaned with chlorhexidine and draped in sterile fashion. Without real-time ultrasound guidance an arterial catheter was placed into the left radial artery.  Appropriate arterial tracings confirmed on monitor.     Complications/Tolerance None; patient tolerated the procedure well.   EBL Minimal   Specimen(s) None

## 2023-04-04 NOTE — Procedures (Signed)
Central Venous Catheter Insertion Procedure Note  NATALIEE SHURTZ  [id number]  [birth year]  Date:04/04/23  Time:5:47 PM   Provider Performing:Pete Bea Laura Tanja Port   Procedure: Insertion of Non-tunneled Central Venous Catheter(36556)with US guidance (40981)    Indication(s) Hemodialysis  Consent Risks of the procedure as well as the alternatives and risks of each were explained to the patient and/or caregiver.  Consent for the procedure was obtained and is signed in the bedside chart  Anesthesia Topical only with 1% lidocaine   Timeout Verified patient identification, verified procedure, site/side was marked, verified correct patient position, special equipment/implants available, medications/allergies/relevant history reviewed, required imaging and test results available.  Sterile Technique Maximal sterile technique including full sterile barrier drape, hand hygiene, sterile gown, sterile gloves, mask, hair covering, sterile ultrasound probe cover (if used).  Procedure Description Area of catheter insertion was cleaned with chlorhexidine and draped in sterile fashion.   With real-time ultrasound guidance a HD catheter was placed into the right internal jugular vein.  Nonpulsatile blood flow and easy flushing noted in all ports.  The catheter was sutured in place and sterile dressing applied.  Complications/Tolerance None; patient tolerated the procedure well. Chest X-ray is ordered to verify placement for internal jugular or subclavian cannulation.  Chest x-ray is not ordered for femoral cannulation.  EBL Minimal  Specimen(s) None   [name] ACNP-BC [facility] Pulmonary/Critical Care Pager # [phone] OR # [phone] if no answer

## 2023-04-05 ENCOUNTER — Inpatient Hospital Stay (HOSPITAL_COMMUNITY): Payer: 59

## 2023-04-05 DIAGNOSIS — J9601 Acute respiratory failure with hypoxia: Secondary | ICD-10-CM | POA: Diagnosis not present

## 2023-04-05 DIAGNOSIS — R601 Generalized edema: Secondary | ICD-10-CM

## 2023-04-05 DIAGNOSIS — Z9911 Dependence on respirator [ventilator] status: Secondary | ICD-10-CM | POA: Diagnosis not present

## 2023-04-05 LAB — CBC
HCT: 28.8 % — ABNORMAL LOW (ref 36.0–46.0)
Hemoglobin: 9.3 g/dL — ABNORMAL LOW (ref 12.0–15.0)
MCH: 31.1 pg (ref 26.0–34.0)
MCHC: 32.3 g/dL (ref 30.0–36.0)
MCV: 96.3 fL (ref 80.0–100.0)
Platelets: 90 10*3/uL — ABNORMAL LOW (ref 150–400)
RBC: 2.99 MIL/uL — ABNORMAL LOW (ref 3.87–5.11)
RDW: 14.2 % (ref 11.5–15.5)
WBC: 15.3 10*3/uL — ABNORMAL HIGH (ref 4.0–10.5)
nRBC: 0 % (ref 0.0–0.2)

## 2023-04-05 LAB — MAGNESIUM: Magnesium: 2.2 mg/dL (ref 1.7–2.4)

## 2023-04-05 LAB — RENAL FUNCTION PANEL
Albumin: 2.3 g/dL — ABNORMAL LOW (ref 3.5–5.0)
Albumin: 2.3 g/dL — ABNORMAL LOW (ref 3.5–5.0)
Anion gap: 10 (ref 5–15)
Anion gap: 7 (ref 5–15)
BUN: 47 mg/dL — ABNORMAL HIGH (ref 8–23)
BUN: 32 mg/dL — ABNORMAL HIGH (ref 8–23)
CO2: 19 mmol/L — ABNORMAL LOW (ref 22–32)
CO2: 22 mmol/L (ref 22–32)
Calcium: 7.7 mg/dL — ABNORMAL LOW (ref 8.9–10.3)
Calcium: 7.6 mg/dL — ABNORMAL LOW (ref 8.9–10.3)
Chloride: 109 mmol/L (ref 98–111)
Chloride: 107 mmol/L (ref 98–111)
Creatinine, Ser: 2.98 mg/dL — ABNORMAL HIGH (ref 0.44–1.00)
Creatinine, Ser: 2.06 mg/dL — ABNORMAL HIGH (ref 0.44–1.00)
GFR, Estimated: 17 mL/min — ABNORMAL LOW (ref >60)
GFR, Estimated: 27 mL/min — ABNORMAL LOW (ref >60)
Glucose, Bld: 105 mg/dL — ABNORMAL HIGH (ref 70–99)
Glucose, Bld: 136 mg/dL — ABNORMAL HIGH (ref 70–99)
Phosphorus: 2.9 mg/dL (ref 2.5–4.6)
Phosphorus: 1.9 mg/dL — ABNORMAL LOW (ref 2.5–4.6)
Potassium: 3.9 mmol/L (ref 3.5–5.1)
Potassium: 3.8 mmol/L (ref 3.5–5.1)
Sodium: 138 mmol/L (ref 135–145)
Sodium: 136 mmol/L (ref 135–145)

## 2023-04-05 LAB — BASIC METABOLIC PANEL
Anion gap: 10 (ref 5–15)
BUN: 47 mg/dL — ABNORMAL HIGH (ref 8–23)
CO2: 20 mmol/L — ABNORMAL LOW (ref 22–32)
Calcium: 7.8 mg/dL — ABNORMAL LOW (ref 8.9–10.3)
Chloride: 109 mmol/L (ref 98–111)
Creatinine, Ser: 2.82 mg/dL — ABNORMAL HIGH (ref 0.44–1.00)
GFR, Estimated: 18 mL/min — ABNORMAL LOW (ref >60)
Glucose, Bld: 104 mg/dL — ABNORMAL HIGH (ref 70–99)
Potassium: 3.9 mmol/L (ref 3.5–5.1)
Sodium: 139 mmol/L (ref 135–145)

## 2023-04-05 LAB — GLUCOSE, CAPILLARY
Glucose-Capillary: 108 mg/dL — ABNORMAL HIGH (ref 70–99)
Glucose-Capillary: 134 mg/dL — ABNORMAL HIGH (ref 70–99)
Glucose-Capillary: 139 mg/dL — ABNORMAL HIGH (ref 70–99)
Glucose-Capillary: 139 mg/dL — ABNORMAL HIGH (ref 70–99)
Glucose-Capillary: 153 mg/dL — ABNORMAL HIGH (ref 70–99)
Glucose-Capillary: 93 mg/dL (ref 70–99)
Glucose-Capillary: 94 mg/dL (ref 70–99)

## 2023-04-05 LAB — CULTURE, RESPIRATORY W GRAM STAIN

## 2023-04-05 LAB — PHOSPHORUS
Phosphorus: 2.8 mg/dL (ref 2.5–4.6)
Phosphorus: 1.9 mg/dL — ABNORMAL LOW (ref 2.5–4.6)

## 2023-04-05 LAB — APTT: aPTT: 93 seconds — ABNORMAL HIGH (ref 24–36)

## 2023-04-05 MED ORDER — RENA-VITE PO TABS
1.0000 | ORAL_TABLET | Freq: Every day | ORAL | Status: DC
  Administered 2023-04-05 – 2023-04-27 (×21): 1
  Filled 2023-04-05 (×23): qty 1

## 2023-04-05 MED ORDER — PHENOL 1.4 % MT LIQD
1.0000 | OROMUCOSAL | Status: DC | PRN
  Filled 2023-04-05: qty 177

## 2023-04-05 MED ORDER — MIDAZOLAM HCL 2 MG/2ML IJ SOLN
2.0000 mg | INTRAMUSCULAR | Status: DC | PRN
  Administered 2023-04-06 (×5): 2 mg via INTRAVENOUS

## 2023-04-05 MED ORDER — FENTANYL 2500MCG IN NS 250ML (10MCG/ML) PREMIX INFUSION
50.0000 ug/h | INTRAVENOUS | Status: DC
  Administered 2023-04-05: 50 ug/h via INTRAVENOUS
  Administered 2023-04-06 (×2): 200 ug/h via INTRAVENOUS
  Administered 2023-04-07: 100 ug/h via INTRAVENOUS
  Administered 2023-04-07: 200 ug/h via INTRAVENOUS
  Administered 2023-04-08: 100 ug/h via INTRAVENOUS
  Administered 2023-04-09: 50 ug/h via INTRAVENOUS
  Filled 2023-04-05 (×7): qty 250

## 2023-04-05 MED ORDER — MIDAZOLAM HCL 2 MG/2ML IJ SOLN
INTRAMUSCULAR | Status: AC
  Administered 2023-04-05: 2 mg via INTRAVENOUS
  Filled 2023-04-05: qty 2

## 2023-04-05 MED ORDER — CARMEX CLASSIC LIP BALM EX OINT
1.0000 | TOPICAL_OINTMENT | CUTANEOUS | Status: DC | PRN
  Administered 2023-04-07 – 2023-04-21 (×5): 1 via TOPICAL
  Filled 2023-04-05 (×3): qty 10

## 2023-04-05 MED ORDER — SODIUM CHLORIDE 0.9 % IV SOLN
20.0000 ug | Freq: Once | INTRAVENOUS | Status: AC
  Administered 2023-04-05: 20 ug via INTRAVENOUS
  Filled 2023-04-05: qty 5

## 2023-04-05 MED ORDER — FENTANYL BOLUS VIA INFUSION
50.0000 ug | INTRAVENOUS | Status: DC | PRN
  Administered 2023-04-05: 100 ug via INTRAVENOUS
  Administered 2023-04-05: 50 ug via INTRAVENOUS
  Administered 2023-04-06 – 2023-04-10 (×14): 100 ug via INTRAVENOUS

## 2023-04-05 MED ORDER — METOCLOPRAMIDE HCL 5 MG/ML IJ SOLN
5.0000 mg | Freq: Three times a day (TID) | INTRAMUSCULAR | Status: DC
  Administered 2023-04-05 – 2023-04-09 (×12): 5 mg via INTRAVENOUS
  Filled 2023-04-05 (×12): qty 2

## 2023-04-05 MED ORDER — PIVOT 1.5 CAL PO LIQD
1000.0000 mL | ORAL | Status: DC
  Administered 2023-04-05 – 2023-04-06 (×2): 1000 mL
  Filled 2023-04-05 (×2): qty 1000

## 2023-04-05 MED ORDER — POLYVINYL ALCOHOL 1.4 % OP SOLN: 1.0000 [drp] | OPHTHALMIC | Status: DC | PRN

## 2023-04-05 MED ORDER — FENTANYL CITRATE PF 50 MCG/ML IJ SOSY
50.0000 ug | PREFILLED_SYRINGE | Freq: Once | INTRAMUSCULAR | Status: AC
  Administered 2023-04-05: 50 ug via INTRAVENOUS
  Filled 2023-04-05: qty 1

## 2023-04-05 MED ORDER — ATORVASTATIN CALCIUM 10 MG PO TABS
20.0000 mg | ORAL_TABLET | Freq: Every day | ORAL | Status: DC
  Administered 2023-04-05 – 2023-04-28 (×23): 20 mg
  Filled 2023-04-05 (×3): qty 2
  Filled 2023-04-05 (×2): qty 1
  Filled 2023-04-05 (×4): qty 2
  Filled 2023-04-05: qty 1
  Filled 2023-04-05: qty 2
  Filled 2023-04-05: qty 1
  Filled 2023-04-05 (×4): qty 2
  Filled 2023-04-05: qty 1
  Filled 2023-04-05: qty 2
  Filled 2023-04-05: qty 1
  Filled 2023-04-05 (×4): qty 2

## 2023-04-05 NOTE — Progress Notes (Signed)
NAME:  Tamara Castro, MRN:  [id number], DOB:  07-17-1961, LOS: 4 ADMISSION DATE:  04/01/2023, CONSULTATION DATE:  04/01/23 REFERRING MD:  Salvadore Dom, CHIEF COMPLAINT:  resp failure   History of Present Illness:  HPI obtained from EMR review as patient is intubated and sedated on MV.   61 yoF with PMH as below who presented to [facility] on 7/5 after 1 week hx of URI symptoms admitted for acute hypoxic and hypercarbic respiratory failure 2/2 RLL PNA requiring intubation, septic shock, AKI and acute heart failure.  Found to have pBNP of 49k with elevated troponin's thought secondary to demand ischemia with echo with normal EF, reduced RV, indeterminate diastolic function.  CTA neg for PE 7/5.  CTH neg 7/9.  Covered with azithromycin/ ceftriaxone, switched to cefepime on 7/7.  Shock and AKI improved and extubated 7/7 but required BiPAP.  Required reintubation 7/10 for respiratory failure.  Increasing WBC 7/13, placed on doxycyline switched to vancomycin with elevated PCT with CXR showing increased bibasilar atelectasis vs infiltrates with increasing pleural effusions.  Initial blood culture x1 w/ stap epi, thought contaminate with repeat BC 7/7 negative.  Some concern for ileus with high OGT residuals with CT a/p showing slightly worse dilation of suspected chronic ileus with prior surgical changes but no transition point, possible emesis of TF since held, remains, NPO on reglan.  Patient has failed SBT for several days, due to apnea despite decreased sedation.  Transferred to [facility] ICU for further care, admitted to PCCM.   Pertinent  Medical History  Tobacco abuse,  COPD, chronic constipation, prior colectomy in 1990's 2/2 diverticulitis, fibromyalgia (on disability since 2008), depression, anxiety, neuropathy  Home meds> xanax (1mg  QID), lexapro, gabapentin, anoro elliptia, zofran  Significant Hospital Events: Including procedures, antibiotic start and stop dates in addition to other pertinent events   7/5  admitted [facility] CTX and azith started 7/7 extubated. Vanc added and cefepime started; CTX stopped 7/8 vanc stopped 7/10 reintubated  7/16 tx to [facility]. Cefepime stopped 7/17 attempting lasix. Creatinine worse.  Echo EF 60-65%no WMA RV nml  7/18 cr worse after lasix. Weaning but on-going ileus and cr rising presenting obstacle to extubation Trying neostigmine.  7/19 scr worse in spite of holding lasix. Still on low dose precedex. Mental status and renal fxn still barrier to extubation. Nephro consulted > started CRRT.  HD catheter placed.   Interim History / Subjective:  Remains on CRRT, sedated, on mechanical ventilation this morning  Objective   Blood pressure 121/74, pulse 68, temperature (!) 97.3 F (36.3 C), temperature source Axillary, resp. rate 19, height 5\' 5"  (1.651 m), weight 95.8 kg, SpO2 95%. CVP:  [10 mmHg-21 mmHg] 16 mmHg  Vent Mode: PRVC FiO2 (%):  [30 %] 30 % Set Rate:  [20 bmp] 20 bmp Vt Set:  [490 mL] 490 mL PEEP:  [5 cmH20] 5 cmH20 Pressure Support:  [10 cmH20] 10 cmH20 Plateau Pressure:  [7 cmH20-23 cmH20] 22 cmH20   Intake/Output Summary (Last 24 hours) at 04/05/2023 0729 Last data filed at 04/05/2023 0700 Gross per 24 hour  Intake 1100.98 ml  Output 1938 ml  Net -837.02 ml   Filed Weights   04/03/23 0500 04/04/23 1700 04/05/23 0428  Weight: 92.3 kg 93.2 kg 95.8 kg   Examination: General chronically ill-appearing elderly woman lying in bed no acute distress, HENT [place]/AT, eyes anicteric, endotracheal tube in place Pulm breathing comfortably mechanical ventilation, faint rales bilaterally Cardio: S1-S2, regular rate and rhythm Abd obese, soft, nontender Ext:  Anasarca, weeping from her arms Neuro RASS -4, intact cough reflex  Resolved Hospital Problem list   Hypernatremia, resolved Sepsis & septic shock 2/2 moraxella pneumonia Contaminated blood culture X 1   Assessment & Plan:   Acute hypoxic and hypercarbic respiratory failure 2/2 AECOPD, Moraxella PNA,  pleural effusions, and r/o HCAP vs new aspiration component  Tobacco abuse -LTVV - VAP prevention protocol - PAD protocol for sedation - Daily SAT and SBT. - Need significant volume off, anticipate this is going to take several days with CRRT before she would be a reasonable candidate for extubation. -Has been intubated since 7/10 -Smoking cessation counseling when appropriate. -Continue nebulizers  Leukocytosis w/out fever. PCT reassuring -Continue to trend and monitor closely off antibiotics  CAD Elevated troponin's thought secondary to demand ischemia Prolonged QTc Acute HF was entered on error on 7/18 -Telemetry monitoring - Aspirin, statin  AKI, concern for shock related ATN complicated further by  IV contrast 7/11; now w/ progressive metabolic acidosis .  Failed diuretic challenge, now requiring CRRT -Appreciate nephrology's management, CRRT -Strict I/os - Renally dose meds and avoid nephrotoxic meds  Chronic ileus -->improved post neostigmine aspiration - OSH CT a/p 7/11 chronic dilation of small bowel, likely 2/2 colectomy hx. Seen by surgical team. Felt that this was motility issue and not obstruction. Responded to Neostigmine but cxr still w/ sig bowel distention \ -Volume removal with dialysis - Electrolyte management with dialysis - Continue Reglan, dose adjusted for dialysis - Continue trickle tube feeds, do not advance until regularly having bowel movements.  Monitor for signs of aspiration.  Normocytic anemia Thrombocytopenia - ddx sepsis/ critical illness  -No acute indication for transfusion - Monitor  Anxiety/ depression- benzo dependent -Can continue Lexapro and Xanax-both PTA medications  Best Practice (right click and "Reselect all SmartList Selections" daily)   Diet/type: tubefeeds-trickle DVT prophylaxis: prophylactic heparin  GI prophylaxis: PPI Lines: Central line Foley:  Yes, and it is still needed Code Status:  full code Last date of  multidisciplinary goals of care discussion [pending]  Daughter [name] [phone]   This patient is critically ill with multiple organ system failure which requires frequent high complexity decision making, assessment, support, evaluation, and titration of therapies. This was completed through the application of advanced monitoring technologies and extensive interpretation of multiple databases. During this encounter critical care time was devoted to patient care services described in this note for 34 minutes.  Steffanie Dunn, DO 04/05/23 2:13 PM [facility]  For contact information, see Amion. If no response to pager, please call PCCM consult pager. After hours, 7PM- 7AM, please call Elink.

## 2023-04-05 NOTE — Plan of Care (Signed)
  Problem: Clinical Measurements: Goal: Ability to maintain clinical measurements within normal limits will improve Outcome: Progressing   Problem: Clinical Measurements: Goal: Will remain free from infection Outcome: Progressing   Problem: Clinical Measurements: Goal: Diagnostic test results will improve Outcome: Progressing   Problem: Education: Goal: Knowledge of General Education information will improve Description: Including pain rating scale, medication(s)/side effects and non-pharmacologic comfort measures Outcome: Not Progressing

## 2023-04-05 NOTE — Progress Notes (Signed)
eLink Physician-Brief Progress Note Patient Name: Tamara Castro DOB: 1960-12-14 MRN: [id number]   Date of Service  04/05/2023  HPI/Events of Note  Nurse reporting continuing issues with sedation and comfort.  On fentanyl continuous and by bolus.  Predex drip and prn versed.  Boluses of fentanyl affect pressure.  Versed seems to help most.  eICU Interventions  Hourly bolus of versed in creased to 2mg .  If problems continue will change precedex and versed to propofol     Intervention Category Major Interventions: Delirium, psychosis, severe agitation - evaluation and management  Henry Russel, P 04/05/2023, 10:39 PM

## 2023-04-05 NOTE — Progress Notes (Signed)
Nutrition Follow-up  DOCUMENTATION CODES:   Not applicable  INTERVENTION:  Tube Feeds via NG-tube: Continue Pivot 1.5 at 20 mL/hr Once medically appropriate, recommend advancing by 10mL Q12H  to goal rate of 50 mL/hr (1200 mL per day) Tube feeds at goal provides 1800 kcal, 112 gm protein, and 900 mL free water daily. Monitor magnesium, potassium, and phosphorus BID for at least 3 days, MD to replete as needed, as pt is at risk for refeeding syndrome given inadequate PO intake >6 days. Renal Multivitamin w/ minerals daily  NUTRITION DIAGNOSIS:   Inadequate oral intake related to inability to eat as evidenced by NPO status. - Ongoing   GOAL:   Patient will meet greater than or equal to 90% of their needs - Ongoing  MONITOR:   Vent status, Labs, Weight trends  REASON FOR ASSESSMENT:   Consult Assessment of nutrition requirement/status  ASSESSMENT:   62 y.o. female with PMH COPD, chronic constipation, colectomy (in the 1990's) due to diverticulitis, fibromyalgia, depression who initially presented to [facility] for 1 week hx of URI symptoms admitted for acute hypoxic and hypercarbic respiratory failure 2/2 RLL PNA requiring intubation, septic shock, AKI and acute heart failure.    Shock and AKI improved and extubated 7/7 but required BiPAP.  Required reintubation 7/10 for respiratory failure.    Some concern for ileus with high OGT residuals with CT a/p showing slightly worse dilation of suspected chronic ileus with prior surgical changes. Possible emesis of TF since held, remains NPO on reglan.  Patient has failed SBT for several days. Transferred to [facility] ICU for further care.  7/5: admitted [facility] 7/7: extubated 7/10: reintubated  7/16: tx to [facility], remains intubated  7/18: OGT replaced with small bore NG feeding tube 7/19: Trickle feeds started; starting on CRRT  NG-tube was attempted to be advanced post-pyloric by radiology team, advancement was unsuccessful and left in the stomach.    Discussed with RN. Pt is tolerating trickle tube feeds via NG-tube. Plan to continue with trickle feeds at this time.  Patient remains intubated on ventilator support.  MV: 9.8 L/min MAP (a-line): 87 Temp (24hrs), Avg:97.4 F (36.3 C), Min:96.8 F (36 C), Max:98.6 F (37 C)  Drips Precedex Levophed (stopped)  Medications reviewed and include: Dulcolax, Colace, Novolog SSI, Reglan, Protonix, Miralax Labs reviewed: Sodium 139, Potassium 3.9, BUN 47, Creatinine 2.82/2.98, Phosphorus 2.8, Magnesium 2.2 CBG: 76-94 x 24 hrs  UOP: 59 mL x 24 hrs CRRT UF: 1879 mL x 24 hrs  Diet Order:   Diet Order             Diet NPO time specified  Diet effective now                   EDUCATION NEEDS:   Not appropriate for education at this time  Skin:  Skin Assessment: Reviewed RN Assessment Skin Integrity Issues:: Stage I Stage I: Left buttocks  Last BM:  7/16  Height:   Ht Readings from Last 1 Encounters:  04/01/23 5\' 5"  (1.651 m)    Weight:   Wt Readings from Last 1 Encounters:  04/05/23 95.8 kg    Ideal Body Weight:  56.82 kg  BMI:  Body mass index is 35.15 kg/m.  Estimated Nutritional Needs:   Kcal:  1800-2000 kcals  Protein:  95-115 grams  Fluid:  >/= 1.8L   Kirby Crigler RD, LDN Clinical Dietitian See [facility] for contact information.

## 2023-04-05 NOTE — Progress Notes (Signed)
[facility] NEPHROLOGY PROGRESS NOTE  Assessment/ Plan: Pt is a 62 y.o. yo female  with past medical history significant for COPD, fibromyalgia, anxiety depression, colectomy in 1990s due to diverticulitis who was initially presented to [facility] on 7/5 for URI symptoms, admitted for acute hypoxic and hypercapnic respiratory failure due to pneumonia requiring intubation, septic shock, seen as a consultation for the evaluation and management of anuric AKI.   # Acute kidney injury, anuric, likely ischemic ATN in the setting of pneumonia, IV contrast with intermittent use of diuretics causing hemodynamic changes. Started CRRT on 7/19 with temp right IJ HD catheter placement.  Tolerating well so far.  She still looks volume up therefore we will increase UF goal to 100-200 cc/hr. this would help her vent setting as well.  Only on very small dose of Levophed around 1 mics especially with sedation. All 4K bath.  Hold heparin because of oozing at the catheter site this morning.  Discussed with ICU nurse and provider.  # Acute hypoxic and hypercapnic respiratory failure due to COPD exacerbation, pneumonia: Currently intubated and management per primary team/pulmonary team.  UF as tolerated.   # Volume/lower extremity edema: Try to UF with HD.   # Anemia: Hemoglobin stable.  Continue to monitor.   # Metabolic acidosis: Starting dialysis.  Subjective: Seen and examined in ICU.  Tolerating CRRT well.  Slight oozing of the catheter site.  No urine output.  No other major event. Objective Vital signs in last 24 hours: Vitals:   04/05/23 0800 04/05/23 0801 04/05/23 0809 04/05/23 1000  BP: 100/61     Pulse: 77  65 (!) 53  Resp: 19  20 20   Temp: 98.6 F (37 C)     TempSrc: Oral     SpO2: 94% 94% 97% 97%  Weight:      Height:       Weight change:   Intake/Output Summary (Last 24 hours) at 04/05/2023 1028 Last data filed at 04/05/2023 1000 Gross per 24 hour  Intake 1207.12 ml  Output  2456 ml  Net -1248.88 ml       Labs: RENAL PANEL Recent Labs  Lab 04/01/23 1602 04/02/23 0533 04/03/23 0253 04/04/23 0500 04/05/23 0356  NA 143 141 140 136 139  138  K 3.5 4.0 3.7 4.0 3.9  3.9  CL 111 112* 112* 108 109  109  CO2 24 21* 20* 16* 20*  19*  GLUCOSE 93 89 96 97 104*  105*  BUN 53* 59* 64* 67* 47*  47*  CREATININE 2.26* 2.77* 3.45* 3.84* 2.82*  2.98*  CALCIUM 8.0* 7.9* 8.1* 8.0* 7.8*  7.7*  MG 2.1 2.0 1.9  --  2.2  PHOS 3.3  --  4.5 4.3 2.8  2.9  ALBUMIN 2.2*  --  2.8* 2.5* 2.3*    Liver Function Tests: Recent Labs  Lab 04/01/23 1602 04/03/23 0253 04/04/23 0500 04/05/23 0356  AST 14*  --   --   --   ALT 17  --   --   --   ALKPHOS 46  --   --   --   BILITOT 0.8  --   --   --   PROT 5.1*  --   --   --   ALBUMIN 2.2* 2.8* 2.5* 2.3*   No results for input(s): "LIPASE", "AMYLASE" in the last 168 hours. No results for input(s): "AMMONIA" in the last 168 hours. CBC: Recent Labs    04/02/23 0845 04/02/23 1850 04/03/23  0253 04/04/23 0500 04/05/23 0356  HGB 10.9* 9.5* 10.3* 10.1* 9.3*  MCV 99.7  --  99.7 98.2 96.3    Cardiac Enzymes: No results for input(s): "CKTOTAL", "CKMB", "CKMBINDEX", "TROPONINI" in the last 168 hours. CBG: Recent Labs  Lab 04/04/23 1142 04/04/23 1610 04/04/23 1929 04/05/23 0035 04/05/23 0349  GLUCAP 84 82 76 94 93    Iron Studies: No results for input(s): "IRON", "TIBC", "TRANSFERRIN", "FERRITIN" in the last 72 hours. Studies/Results: DG Chest Port 1 View  Result Date: 04/05/2023 CLINICAL DATA:  [address] Respiratory failure (HCC) [phone] EXAM: PORTABLE CHEST - 1 VIEW COMPARISON:  04/04/2023 FINDINGS: Endotracheal tube has been advanced, tip 6 cm above carina. Stable left IJ central line and right IJ hemodialysis catheter. No pneumothorax. Feeding tube is been advanced for at least as far as the stomach, tip not seen. Prominent interstitial opacities in the lung bases right greater than left slightly more conspicuous  than on prior exam. Costophrenic angles are excluded. Visualized bones unremarkable. IMPRESSION: 1. Interval advancement of endotracheal tube and feeding tube. 2. Slightly increased bibasilar interstitial opacities. Electronically Signed   By: Corlis Leak M.D.   On: 04/05/2023 08:59   DG Chest Port 1 View  Result Date: 04/04/2023 CLINICAL DATA:  Evaluate central venous catheter placement. EXAM: PORTABLE CHEST 1 VIEW COMPARISON:  04/04/2023 FINDINGS: Unchanged position ET tube, left IJ catheter and feeding tube. Interval placement of right IJ catheter with tip at the mid SVC. No pneumothorax visualized. The stable cardiomediastinal contours. Interval improved aeration to the left base. Blunting of the right costophrenic angle is noted compatible with small pleural effusion. Emphysematous changes are noted bilaterally. No airspace consolidation. IMPRESSION: 1. Interval placement of right IJ catheter with tip at the mid SVC. No pneumothorax visualized. 2. Interval improved aeration to the left base. 3. Small right pleural effusion. 4.  Aortic Atherosclerosis (ICD10-I70.0). Electronically Signed   By: Signa Kell M.D.   On: 04/04/2023 18:15   DG Naso G Tube Plc W/Fl W/Rad  Result Date: 04/04/2023 CLINICAL DATA:  Provided history: Aspiration into airway. Request received for nasogastric tube advancement to a post pyloric position under fluoroscopy. EXAM: NASO G TUBE PLACEMENT WITH FL AND WITH RAD CONTRAST:  None. FLUOROSCOPY: Fluoroscopy Time:  1 minute 36 seconds Radiation Exposure Index (if provided by the fluoroscopic device): 11.70 Number of Acquired Spot Images: 0 COMPARISON:  Abdominal radiographs 04/04/2023. FINDINGS: Multiple attempts were made to advance a nasogastric tube from the gastric lumen to a post-pyloric position, over a guidewire and under fluoroscopic guidance. Despite multiple attempts, the nasogastric tube could not be advanced beyond the gastric body. At procedure termination, the tip of  the nasogastric tube remained within the gastric body. A fluoroscopic image was saved and sent to PACS. IMPRESSION: Unsuccessful nasogastric tube advancement under fluoroscopy. Despite multiple attempts, the tube could not be advanced beyond the gastric body. Electronically Signed   By: Jackey Loge D.O.   On: 04/04/2023 15:44   DG Abd Portable 1V  Result Date: 04/04/2023 CLINICAL DATA:  Ileus. EXAM: PORTABLE ABDOMEN - 1 VIEW COMPARISON:  April 03, 2023.  April 02, 2023. FINDINGS: Distal tip of feeding tube is again noted in proximal stomach. Residual contrast is noted in distal sigmoid colon and rectum. Grossly stable small bowel dilatation is noted. IMPRESSION: Grossly stable small bowel dilatation is noted. Electronically Signed   By: Lupita Raider M.D.   On: 04/04/2023 12:32   DG Chest Port 1 View  Result Date: 04/04/2023  CLINICAL DATA:  Respiratory failure. EXAM: PORTABLE CHEST 1 VIEW COMPARISON:  04/02/2023 FINDINGS: Endotracheal tube tip projects approximately 7 centimeters above the carina. Enteric tube courses beyond the edge of the image. A LEFT IJ catheter tip overlies the superior vena cava. Heart size is normal. There are patchy opacities in the lung bases, partially obscuring the hemidiaphragms. Suspect bilateral pleural effusions. The appearance is stable. IMPRESSION: 1. No significant change. 2. Bibasilar opacities and pleural effusions. Electronically Signed   By: Norva Pavlov M.D.   On: 04/04/2023 11:09   DG Abd 1 View  Result Date: 04/03/2023 CLINICAL DATA:  NG tube placement EXAM: ABDOMEN - 1 VIEW COMPARISON:  04/01/2023 FINDINGS: Weighted enteric feeding tube is positioned with tip below the diaphragm, within the gastric fundus. Similar, distended configuration of bowel in the central abdomen. No free air. IMPRESSION: 1. Weighted enteric feeding tube is positioned with tip below the diaphragm, within the gastric fundus. 2. Similar, distended configuration of bowel in the central  abdomen. Electronically Signed   By: Jearld Lesch M.D.   On: 04/03/2023 14:40    Medications: Infusions:   prismasol BGK 4/2.5 400 mL/hr at 04/05/23 0751    prismasol BGK 4/2.5 400 mL/hr at 04/05/23 0755   sodium chloride Stopped (04/04/23 1900)   dexmedetomidine (PRECEDEX) IV infusion 1.2 mcg/kg/hr (04/05/23 1000)   heparin 10,000 units/ 20 mL infusion syringe 1,000 Units/hr (04/05/23 1000)   norepinephrine (LEVOPHED) Adult infusion 1 mcg/min (04/05/23 1000)   prismasol BGK 4/2.5 1,500 mL/hr at 04/05/23 9629    Scheduled Medications:  ALPRAZolam  0.5 mg Per Tube TID   arformoterol  15 mcg Nebulization BID   aspirin  81 mg Per Tube Daily   atorvastatin  20 mg Per Tube Daily   bisacodyl  10 mg Rectal Daily   budesonide (PULMICORT) nebulizer solution  0.5 mg Nebulization BID   Chlorhexidine Gluconate Cloth  6 each Topical Daily   docusate  100 mg Per Tube BID   escitalopram  10 mg Per Tube Daily   feeding supplement (PIVOT 1.5 CAL)  1,000 mL Per Tube Q24H   heparin injection (subcutaneous)  5,000 Units Subcutaneous Q12H   insulin aspart  0-6 Units Subcutaneous Q4H   metoCLOPramide (REGLAN) injection  5 mg Intravenous Q8H   mouth rinse  15 mL Mouth Rinse Q2H   pantoprazole (PROTONIX) IV  40 mg Intravenous Q12H   polyethylene glycol  17 g Per NG tube Daily   revefenacin  175 mcg Nebulization Daily   sodium chloride flush  10-40 mL Intracatheter Q12H    have reviewed scheduled and prn medications.  Physical Exam: General: Critically ill looking female, intubated, sedated. Heart:RRR, s1s2 nl Lungs: Coarse breath sound bilateral Abdomen:soft, non-distended Extremities: Upper and lower extremities pitting edema present++ Dialysis Access: Right IJ temporary HD catheter placement on 7/19.   Prasad  04/05/2023,10:28 AM  LOS: 4 days

## 2023-04-05 NOTE — Progress Notes (Signed)
Bleeding around HD catheter-- [place] heparin stopped, off heparin in circuit. Had been on renally dose lovenox until yesterday may be lingering Giving DDAVP for platelet dysfunction.   Steffanie Dunn, DO 04/05/23 6:59 PM [facility]  For contact information, see Amion. If no response to pager, please call PCCM consult pager. After hours, 7PM- 7AM, please call Elink.

## 2023-04-06 DIAGNOSIS — K567 Ileus, unspecified: Secondary | ICD-10-CM | POA: Diagnosis not present

## 2023-04-06 DIAGNOSIS — Z9911 Dependence on respirator [ventilator] status: Secondary | ICD-10-CM | POA: Diagnosis not present

## 2023-04-06 DIAGNOSIS — N179 Acute kidney failure, unspecified: Secondary | ICD-10-CM | POA: Diagnosis not present

## 2023-04-06 DIAGNOSIS — J9601 Acute respiratory failure with hypoxia: Secondary | ICD-10-CM | POA: Diagnosis not present

## 2023-04-06 LAB — RENAL FUNCTION PANEL
Albumin: 2.4 g/dL — ABNORMAL LOW (ref 3.5–5.0)
Albumin: 2.3 g/dL — ABNORMAL LOW (ref 3.5–5.0)
Anion gap: 7 (ref 5–15)
Anion gap: 6 (ref 5–15)
BUN: 24 mg/dL — ABNORMAL HIGH (ref 8–23)
BUN: 16 mg/dL (ref 8–23)
CO2: 24 mmol/L (ref 22–32)
CO2: 25 mmol/L (ref 22–32)
Calcium: 7.7 mg/dL — ABNORMAL LOW (ref 8.9–10.3)
Calcium: 7.5 mg/dL — ABNORMAL LOW (ref 8.9–10.3)
Chloride: 104 mmol/L (ref 98–111)
Chloride: 103 mmol/L (ref 98–111)
Creatinine, Ser: 1.65 mg/dL — ABNORMAL HIGH (ref 0.44–1.00)
Creatinine, Ser: 1.29 mg/dL — ABNORMAL HIGH (ref 0.44–1.00)
GFR, Estimated: 35 mL/min — ABNORMAL LOW (ref >60)
GFR, Estimated: 47 mL/min — ABNORMAL LOW (ref >60)
Glucose, Bld: 150 mg/dL — ABNORMAL HIGH (ref 70–99)
Glucose, Bld: 138 mg/dL — ABNORMAL HIGH (ref 70–99)
Phosphorus: 1.5 mg/dL — ABNORMAL LOW (ref 2.5–4.6)
Phosphorus: 1.8 mg/dL — ABNORMAL LOW (ref 2.5–4.6)
Potassium: 3.8 mmol/L (ref 3.5–5.1)
Potassium: 3.6 mmol/L (ref 3.5–5.1)
Sodium: 135 mmol/L (ref 135–145)
Sodium: 134 mmol/L — ABNORMAL LOW (ref 135–145)

## 2023-04-06 LAB — CBC WITH DIFFERENTIAL/PLATELET
Abs Immature Granulocytes: 0.13 10*3/uL — ABNORMAL HIGH (ref 0.00–0.07)
Basophils Absolute: 0 10*3/uL (ref 0.0–0.1)
Basophils Relative: 0 %
Eosinophils Absolute: 0.1 10*3/uL (ref 0.0–0.5)
Eosinophils Relative: 1 %
HCT: 29.1 % — ABNORMAL LOW (ref 36.0–46.0)
Hemoglobin: 9.5 g/dL — ABNORMAL LOW (ref 12.0–15.0)
Immature Granulocytes: 1 %
Lymphocytes Relative: 9 %
Lymphs Abs: 1.4 10*3/uL (ref 0.7–4.0)
MCH: 31.3 pg (ref 26.0–34.0)
MCHC: 32.6 g/dL (ref 30.0–36.0)
MCV: 95.7 fL (ref 80.0–100.0)
Monocytes Absolute: 1.1 10*3/uL — ABNORMAL HIGH (ref 0.1–1.0)
Monocytes Relative: 7 %
Neutro Abs: 13.7 10*3/uL — ABNORMAL HIGH (ref 1.7–7.7)
Neutrophils Relative %: 82 %
Platelets: 85 10*3/uL — ABNORMAL LOW (ref 150–400)
RBC: 3.04 MIL/uL — ABNORMAL LOW (ref 3.87–5.11)
RDW: 14.2 % (ref 11.5–15.5)
WBC: 16.5 10*3/uL — ABNORMAL HIGH (ref 4.0–10.5)
nRBC: 0 % (ref 0.0–0.2)

## 2023-04-06 LAB — GLUCOSE, CAPILLARY
Glucose-Capillary: 139 mg/dL — ABNORMAL HIGH (ref 70–99)
Glucose-Capillary: 139 mg/dL — ABNORMAL HIGH (ref 70–99)
Glucose-Capillary: 161 mg/dL — ABNORMAL HIGH (ref 70–99)
Glucose-Capillary: 134 mg/dL — ABNORMAL HIGH (ref 70–99)
Glucose-Capillary: 122 mg/dL — ABNORMAL HIGH (ref 70–99)

## 2023-04-06 LAB — MAGNESIUM: Magnesium: 2.4 mg/dL (ref 1.7–2.4)

## 2023-04-06 LAB — HEPATIC FUNCTION PANEL
ALT: 15 U/L (ref 0–44)
AST: 15 U/L (ref 15–41)
Albumin: 2.5 g/dL — ABNORMAL LOW (ref 3.5–5.0)
Alkaline Phosphatase: 49 U/L (ref 38–126)
Bilirubin, Direct: 0.2 mg/dL (ref 0.0–0.2)
Indirect Bilirubin: 0.5 mg/dL (ref 0.3–0.9)
Total Bilirubin: 0.7 mg/dL (ref 0.3–1.2)
Total Protein: 5.5 g/dL — ABNORMAL LOW (ref 6.5–8.1)

## 2023-04-06 LAB — PHOSPHORUS: Phosphorus: 1.5 mg/dL — ABNORMAL LOW (ref 2.5–4.6)

## 2023-04-06 LAB — MRSA NEXT GEN BY PCR, NASAL: MRSA by PCR Next Gen: NOT DETECTED

## 2023-04-06 LAB — APTT: aPTT: 31 seconds (ref 24–36)

## 2023-04-06 MED ORDER — MIDAZOLAM HCL 2 MG/2ML IJ SOLN
INTRAMUSCULAR | Status: AC
  Administered 2023-04-06: 2 mg via INTRAVENOUS
  Filled 2023-04-06: qty 2

## 2023-04-06 MED ORDER — PROPOFOL 1000 MG/100ML IV EMUL
5.0000 ug/kg/min | INTRAVENOUS | Status: DC
  Administered 2023-04-06: 5 ug/kg/min via INTRAVENOUS
  Administered 2023-04-07: 13 ug/kg/min via INTRAVENOUS
  Administered 2023-04-07: 15 ug/kg/min via INTRAVENOUS
  Administered 2023-04-07 – 2023-04-08 (×2): 30 ug/kg/min via INTRAVENOUS
  Administered 2023-04-08 (×2): 25 ug/kg/min via INTRAVENOUS
  Administered 2023-04-08: 30 ug/kg/min via INTRAVENOUS
  Administered 2023-04-09: 15 ug/kg/min via INTRAVENOUS
  Administered 2023-04-09: 30 ug/kg/min via INTRAVENOUS
  Filled 2023-04-06 (×9): qty 100

## 2023-04-06 MED ORDER — SODIUM PHOSPHATES 45 MMOLE/15ML IV SOLN
30.0000 mmol | Freq: Once | INTRAVENOUS | Status: AC
  Administered 2023-04-06: 30 mmol via INTRAVENOUS
  Filled 2023-04-06: qty 10

## 2023-04-06 MED ORDER — MIDAZOLAM HCL 2 MG/2ML IJ SOLN
INTRAMUSCULAR | Status: AC
  Filled 2023-04-06: qty 2

## 2023-04-06 MED ORDER — SODIUM CHLORIDE 0.9 % IV SOLN
2.0000 g | Freq: Two times a day (BID) | INTRAVENOUS | Status: DC
  Administered 2023-04-06 – 2023-04-08 (×5): 2 g via INTRAVENOUS
  Filled 2023-04-06 (×5): qty 12.5

## 2023-04-06 NOTE — Progress Notes (Signed)
[facility] NEPHROLOGY PROGRESS NOTE  Assessment/ Plan: Pt is a 62 y.o. yo female  with past medical history significant for COPD, fibromyalgia, anxiety depression, colectomy in 1990s due to diverticulitis who was initially presented to [facility] on 7/5 for URI symptoms, admitted for acute hypoxic and hypercapnic respiratory failure due to pneumonia requiring intubation, septic shock, seen as a consultation for the evaluation and management of anuric AKI.   # Acute kidney injury, anuric, likely ischemic ATN in the setting of pneumonia, IV contrast with intermittent use of diuretics causing hemodynamic changes. Started CRRT on 7/19 with temp right IJ HD catheter placement.  Tolerating UF 100-200 cc/hr so far on only small dose of Levophed.  She still looks volume up therefore I like to continue UF today.  Holding heparin because of bleeding around the catheter site and patient has received DDAVP.  Continue all 4K bath.  Discussed with ICU nurse and provider.  # Acute hypoxic and hypercapnic respiratory failure due to COPD exacerbation, pneumonia: Currently intubated and management per primary team/pulmonary team.  UF as tolerated.   # Volume/lower extremity edema: Try to UF with HD.   # Anemia: Hemoglobin slight drop because of some blood loss from the catheter site.  Monitor.     # Metabolic acidosis: Improved with CRRT.  # Hypophosphatemia: Received repletion, monitor lab.  Subjective: Seen and examined in ICU.  Event noted which includes bleeding around the catheter site requiring DDAVP.  We held heparin yesterday.  Tolerating UF well so far.  No urine output.  Only on 2 mics of levo.  Objective Vital signs in last 24 hours: Vitals:   04/06/23 0730 04/06/23 0751 04/06/23 0752 04/06/23 0754  BP:      Pulse: 78     Resp: 20     Temp:      TempSrc:      SpO2: 94% 94% 93% 93%  Weight:      Height:       Weight change: -0.5 kg  Intake/Output Summary (Last 24 hours) at  04/06/2023 0810 Last data filed at 04/06/2023 0700 Gross per 24 hour  Intake 1890.61 ml  Output 5090 ml  Net -3199.39 ml       Labs: RENAL PANEL Recent Labs  Lab 04/01/23 1602 04/02/23 0533 04/03/23 0253 04/04/23 0500 04/05/23 0356 04/05/23 1650 04/06/23 0336  NA 143 141 140 136 139  138 136 135  K 3.5 4.0 3.7 4.0 3.9  3.9 3.8 3.8  CL 111 112* 112* 108 109  109 107 104  CO2 24 21* 20* 16* 20*  19* 22 24  GLUCOSE 93 89 96 97 104*  105* 136* 150*  BUN 53* 59* 64* 67* 47*  47* 32* 24*  CREATININE 2.26* 2.77* 3.45* 3.84* 2.82*  2.98* 2.06* 1.65*  CALCIUM 8.0* 7.9* 8.1* 8.0* 7.8*  7.7* 7.6* 7.7*  MG 2.1 2.0 1.9  --  2.2  --  2.4  PHOS 3.3  --  4.5 4.3 2.8  2.9 1.9*  1.9* 1.5*  1.5*  ALBUMIN 2.2*  --  2.8* 2.5* 2.3* 2.3* 2.5*  2.4*    Liver Function Tests: Recent Labs  Lab 04/01/23 1602 04/03/23 0253 04/05/23 0356 04/05/23 1650 04/06/23 0336  AST 14*  --   --   --  15  ALT 17  --   --   --  15  ALKPHOS 46  --   --   --  49  BILITOT 0.8  --   --   --  0.7  PROT 5.1*  --   --   --  5.5*  ALBUMIN 2.2*   < > 2.3* 2.3* 2.5*  2.4*   < > = values in this interval not displayed.   No results for input(s): "LIPASE", "AMYLASE" in the last 168 hours. No results for input(s): "AMMONIA" in the last 168 hours. CBC: Recent Labs    04/02/23 1850 04/03/23 0253 04/04/23 0500 04/05/23 0356 04/06/23 0336  HGB 9.5* 10.3* 10.1* 9.3* 9.5*  MCV  --  99.7 98.2 96.3 95.7    Cardiac Enzymes: No results for input(s): "CKTOTAL", "CKMB", "CKMBINDEX", "TROPONINI" in the last 168 hours. CBG: Recent Labs  Lab 04/05/23 1545 04/05/23 2004 04/05/23 2343 04/06/23 0335 04/06/23 0749  GLUCAP 134* 153* 139* 139* 139*    Iron Studies: No results for input(s): "IRON", "TIBC", "TRANSFERRIN", "FERRITIN" in the last 72 hours. Studies/Results: DG Chest Port 1 View  Result Date: 04/05/2023 CLINICAL DATA:  [address] Respiratory failure (HCC) [phone] EXAM: PORTABLE CHEST - 1 VIEW  COMPARISON:  04/04/2023 FINDINGS: Endotracheal tube has been advanced, tip 6 cm above carina. Stable left IJ central line and right IJ hemodialysis catheter. No pneumothorax. Feeding tube is been advanced for at least as far as the stomach, tip not seen. Prominent interstitial opacities in the lung bases right greater than left slightly more conspicuous than on prior exam. Costophrenic angles are excluded. Visualized bones unremarkable. IMPRESSION: 1. Interval advancement of endotracheal tube and feeding tube. 2. Slightly increased bibasilar interstitial opacities. Electronically Signed   By: Corlis Leak M.D.   On: 04/05/2023 08:59   DG Chest Port 1 View  Result Date: 04/04/2023 CLINICAL DATA:  Evaluate central venous catheter placement. EXAM: PORTABLE CHEST 1 VIEW COMPARISON:  04/04/2023 FINDINGS: Unchanged position ET tube, left IJ catheter and feeding tube. Interval placement of right IJ catheter with tip at the mid SVC. No pneumothorax visualized. The stable cardiomediastinal contours. Interval improved aeration to the left base. Blunting of the right costophrenic angle is noted compatible with small pleural effusion. Emphysematous changes are noted bilaterally. No airspace consolidation. IMPRESSION: 1. Interval placement of right IJ catheter with tip at the mid SVC. No pneumothorax visualized. 2. Interval improved aeration to the left base. 3. Small right pleural effusion. 4.  Aortic Atherosclerosis (ICD10-I70.0). Electronically Signed   By: Signa Kell M.D.   On: 04/04/2023 18:15   DG Naso G Tube Plc W/Fl W/Rad  Result Date: 04/04/2023 CLINICAL DATA:  Provided history: Aspiration into airway. Request received for nasogastric tube advancement to a post pyloric position under fluoroscopy. EXAM: NASO G TUBE PLACEMENT WITH FL AND WITH RAD CONTRAST:  None. FLUOROSCOPY: Fluoroscopy Time:  1 minute 36 seconds Radiation Exposure Index (if provided by the fluoroscopic device): 11.70 Number of Acquired Spot  Images: 0 COMPARISON:  Abdominal radiographs 04/04/2023. FINDINGS: Multiple attempts were made to advance a nasogastric tube from the gastric lumen to a post-pyloric position, over a guidewire and under fluoroscopic guidance. Despite multiple attempts, the nasogastric tube could not be advanced beyond the gastric body. At procedure termination, the tip of the nasogastric tube remained within the gastric body. A fluoroscopic image was saved and sent to PACS. IMPRESSION: Unsuccessful nasogastric tube advancement under fluoroscopy. Despite multiple attempts, the tube could not be advanced beyond the gastric body. Electronically Signed   By: Jackey Loge D.O.   On: 04/04/2023 15:44   DG Abd Portable 1V  Result Date: 04/04/2023 CLINICAL DATA:  Ileus. EXAM: PORTABLE ABDOMEN - 1 VIEW COMPARISON:  April 03, 2023.  April 02, 2023. FINDINGS: Distal tip of feeding tube is again noted in proximal stomach. Residual contrast is noted in distal sigmoid colon and rectum. Grossly stable small bowel dilatation is noted. IMPRESSION: Grossly stable small bowel dilatation is noted. Electronically Signed   By: Lupita Raider M.D.   On: 04/04/2023 12:32    Medications: Infusions:   prismasol BGK 4/2.5 400 mL/hr at 04/06/23 0640    prismasol BGK 4/2.5 400 mL/hr at 04/06/23 0640   sodium chloride Stopped (04/04/23 1900)   dexmedetomidine (PRECEDEX) IV infusion 1.2 mcg/kg/hr (04/06/23 0736)   fentaNYL infusion INTRAVENOUS 200 mcg/hr (04/06/23 0700)   norepinephrine (LEVOPHED) Adult infusion 1 mcg/min (04/06/23 0700)   prismasol BGK 4/2.5 1,500 mL/hr at 04/06/23 0640   sodium phosphate 30 mmol in dextrose 5 % 250 mL infusion      Scheduled Medications:  ALPRAZolam  0.5 mg Per Tube TID   arformoterol  15 mcg Nebulization BID   aspirin  81 mg Per Tube Daily   atorvastatin  20 mg Per Tube Daily   bisacodyl  10 mg Rectal Daily   budesonide (PULMICORT) nebulizer solution  0.5 mg Nebulization BID   Chlorhexidine Gluconate  Cloth  6 each Topical Daily   docusate  100 mg Per Tube BID   escitalopram  10 mg Per Tube Daily   feeding supplement (PIVOT 1.5 CAL)  1,000 mL Per Tube Q24H   insulin aspart  0-6 Units Subcutaneous Q4H   metoCLOPramide (REGLAN) injection  5 mg Intravenous Q8H   midazolam       multivitamin  1 tablet Per Tube QHS   mouth rinse  15 mL Mouth Rinse Q2H   pantoprazole (PROTONIX) IV  40 mg Intravenous Q12H   polyethylene glycol  17 g Per NG tube Daily   revefenacin  175 mcg Nebulization Daily   sodium chloride flush  10-40 mL Intracatheter Q12H    have reviewed scheduled and prn medications.  Physical Exam: General: Critically ill looking female, intubated, sedated. Heart:RRR, s1s2 nl Lungs: Coarse breath sound bilateral Abdomen:soft, non-distended Extremities: Upper and lower extremities pitting edema present+ Dialysis Access: Right IJ temporary HD catheter placement on 7/19.   Prasad  04/06/2023,8:10 AM  LOS: 5 days

## 2023-04-06 NOTE — Progress Notes (Signed)
Pharmacy Antibiotic Note  Tamara Castro is a 62 y.o. female admitted on 04/01/2023 as transfer from [facility]. She arrived intubated. She was previously intubated 7/5-7/7 and then reintubated 7/10. She was on a course of antibiotics at [facility]. Respiratory culture there with Moraxella. Patient monitored off antibiotics on arrival. Pharmacy has now been consulted for cefepime dosing.  Plan: -Cefepime 2 g IV q12h for CRRT -Continue to follow plans for CRRT, cultures and clinical progress for dose adjustments and de-escalation/LOT as indicated  Height: 5\' 5"  (165.1 cm) Weight: 92.7 kg (204 lb 5.9 oz) IBW/kg (Calculated) : 57  Temp (24hrs), Avg:98.2 F (36.8 C), Min:97.8 F (36.6 C), Max:98.4 F (36.9 C)  Recent Labs  Lab 04/02/23 0845 04/03/23 0253 04/04/23 0500 04/05/23 0356 04/05/23 1650 04/06/23 0336  WBC 19.3* 16.3* 16.2* 15.3*  --  16.5*  CREATININE  --  3.45* 3.84* 2.82*  2.98* 2.06* 1.65*    Estimated Creatinine Clearance: 39.8 mL/min (A) (by C-G formula based on SCr of 1.65 mg/dL (H)).    Allergies  Allergen Reactions   Ciprofloxacin Diarrhea    Per pt, this medication gave her c-diff    Antimicrobials this admission: Cefepime 7/21 >>  At [facility]: Cefepime 7/7 >> 7/16 Vanc 7/7 >> 7/8; 7/15 >> 7/16 Doxycycline 7/13 >> 7/14 CTX/azithro 7/5 >> 7/7  Dose adjustments this admission: NA  Microbiology results: 7/19 Resp cx: pending  7/16 MRSA PCR: negative  Thank you for allowing pharmacy to be a part of this patient's care.  Pricilla Riffle, PharmD, BCPS Clinical Pharmacist 04/06/2023 2:07 PM

## 2023-04-06 NOTE — Progress Notes (Signed)
NAME:  Tamara Castro, MRN:  [id number], DOB:  September 11, 1961, LOS: 5 ADMISSION DATE:  04/01/2023, CONSULTATION DATE:  04/01/23 REFERRING MD:  Salvadore Dom, CHIEF COMPLAINT:  resp failure   History of Present Illness:  HPI obtained from EMR review as patient is intubated and sedated on MV.   81 yoF with PMH as below who presented to [facility] on 7/5 after 1 week hx of URI symptoms admitted for acute hypoxic and hypercarbic respiratory failure 2/2 RLL PNA requiring intubation, septic shock, AKI and acute heart failure.  Found to have pBNP of 49k with elevated troponin's thought secondary to demand ischemia with echo with normal EF, reduced RV, indeterminate diastolic function.  CTA neg for PE 7/5.  CTH neg 7/9.  Covered with azithromycin/ ceftriaxone, switched to cefepime on 7/7.  Shock and AKI improved and extubated 7/7 but required BiPAP.  Required reintubation 7/10 for respiratory failure.  Increasing WBC 7/13, placed on doxycyline switched to vancomycin with elevated PCT with CXR showing increased bibasilar atelectasis vs infiltrates with increasing pleural effusions.  Initial blood culture x1 w/ stap epi, thought contaminate with repeat BC 7/7 negative.  Some concern for ileus with high OGT residuals with CT a/p showing slightly worse dilation of suspected chronic ileus with prior surgical changes but no transition point, possible emesis of TF since held, remains, NPO on reglan.  Patient has failed SBT for several days, due to apnea despite decreased sedation.  Transferred to [facility] ICU for further care, admitted to PCCM.   Pertinent  Medical History  Tobacco abuse,  COPD, chronic constipation, prior colectomy in 1990's 2/2 diverticulitis, fibromyalgia (on disability since 2008), depression, anxiety, neuropathy  Home meds> xanax (1mg  QID), lexapro, gabapentin, anoro elliptia, zofran  Significant Hospital Events: Including procedures, antibiotic start and stop dates in addition to other pertinent events   7/5  admitted [facility] CTX and azith started 7/7 extubated. Vanc added and cefepime started; CTX stopped 7/8 vanc stopped 7/10 reintubated  7/16 tx to [facility]. Cefepime stopped 7/17 attempting lasix. Creatinine worse.  Echo EF 60-65%no WMA RV nml  7/18 cr worse after lasix. Weaning but on-going ileus and cr rising presenting obstacle to extubation Trying neostigmine.  7/19 scr worse in spite of holding lasix. Still on low dose precedex. Mental status and renal fxn still barrier to extubation. Nephro consulted > started CRRT.  HD catheter placed.   Interim History / Subjective:  Remains intubated and sedated, bleeding around IJ central line improved.  Objective   Blood pressure 105/67, pulse 63, temperature 98.4 F (36.9 C), temperature source Axillary, resp. rate 20, height 5\' 5"  (1.651 m), weight 92.7 kg, SpO2 98%. CVP:  [9 mmHg-26 mmHg] 16 mmHg  Vent Mode: PRVC FiO2 (%):  [30 %] 30 % Set Rate:  [20 bmp] 20 bmp Vt Set:  [490 mL] 490 mL PEEP:  [5 cmH20] 5 cmH20 Plateau Pressure:  [15 cmH20-22 cmH20] 15 cmH20   Intake/Output Summary (Last 24 hours) at 04/06/2023 0736 Last data filed at 04/06/2023 0700 Gross per 24 hour  Intake 1948.51 ml  Output 5256 ml  Net -3307.49 ml   Filed Weights   04/04/23 1700 04/05/23 0428 04/06/23 0500  Weight: 93.2 kg 95.8 kg 92.7 kg   Examination: General chronically ill-appearing woman lying in bed no acute distress, intubated and sedated. HENT [place]/AT, eyes anicteric, endotracheal tube in place. Pulm breathing comfortably mechanical ventilation, minimal endotracheal secretions.  No wheezing.  Improving rales. Cardio: S1-S2, regular rate Abd obese, soft, nontender, nondistended Ext:  Anasarca, still weeping from her arms Neuro RASS -3, no ARDS to answer some yes and no questions, only with his toes on command, not moving upper extremities on command  I/O -3.3L  BUN 24. Cr 1.65 on CRRT Phos 1.5 BUN 24 Cr 1.65 WBC 16.5 H/H 9.5/29.1 Platelets 85  Trach  aspirate> mod PMNs, few yeast  Resolved Hospital Problem list   Hypernatremia, resolved Sepsis & septic shock 2/2 moraxella pneumonia Contaminated blood culture X 1   Assessment & Plan:   Acute hypoxic and hypercarbic respiratory failure 2/2 AECOPD, Moraxella PNA, pleural effusions, and r/o HCAP vs new aspiration component  Tobacco abuse - Goal net negative volume balance, still pulling with CRRT - LTV V - VAP prevention protocol - PAD protocol for sedation - Daily SAT and SBT as appropriate - Minimizing opiate sedation is much as possible with ileus. -Has been intubated since 7/10> hopeful for extubation this week, but otherwise would have to start proximal tracheostomy around the end of the week if we are unsuccessful.  Previously has had 1 failed extubation attempt at [facility]. -With plateaued WBC and lack of reliability of fever curve since she is on CRRT, multiple PMNs on trach aspirate will resume empiric antibiotics.  Repeat MRSA swab. -Continue nebulizers - Smoking cessation counseling when appropriate  Leukocytosis w/out fever. PCT reassuring.  Fever curve unreliable on CRRT. -Repeat trach aspirate, currently 1 pending - Empirically resume antibiotics and monitor  CAD Elevated troponins thought secondary to demand ischemia Prolonged QTc Acute HF was entered on error on 7/18 - Aspirin, statin - Telemetry monitoring  AKI, concern for shock related ATN complicated further by  IV contrast 7/11; now w/ progressive metabolic acidosis .  Failed diuretic challenge, now requiring CRRT - Continue CRRT, net negative volume balance - Appreciate nephrology's management - Strict I's/O - Renally dose medications  Chronic ileus -->improved post neostigmine on 7/18. 1 BM yesterday. Aspiration - OSH CT a/p 7/11 chronic dilation of small bowel, likely 2/2 colectomy hx. Seen by surgical team. Felt that this was motility issue and not obstruction. Responded to Neostigmine but cxr still w/  sig bowel distention \ -Optimize volume status with CRRT - Electrolyte management with dialysis - Continue Reglan-dose adjusted for renal function. - Trickle tube feeds.  If she has another bowel movement today can advance.  Normocytic anemia Thrombocytopenia - ddx sepsis/ critical illness  -Monitor for bleeding - No acute indication for transfusion If she starts bleeding around her line again can try TXA or an additional dose of DDAVP.  Anxiety & depression- benzo dependent - Continue PTA Lexapro and Xanax, which were both PTA medications.  Best Practice (right click and "Reselect all SmartList Selections" daily)   Diet/type: tubefeeds-trickle DVT prophylaxis: prophylactic heparin  GI prophylaxis: PPI Lines: Central line Foley:  Yes, and it is still needed Code Status:  full code Last date of multidisciplinary goals of care discussion [ ]   Daughter [name] [phone]   This patient is critically ill with multiple organ system failure which requires frequent high complexity decision making, assessment, support, evaluation, and titration of therapies. This was completed through the application of advanced monitoring technologies and extensive interpretation of multiple databases. During this encounter critical care time was devoted to patient care services described in this note for 36 minutes.  Steffanie Dunn, DO 04/06/23 1:50 PM [facility]  For contact information, see Amion. If no response to pager, please call PCCM consult pager. After hours, 7PM- 7AM,  please call Elink.

## 2023-04-06 NOTE — Plan of Care (Signed)
  Problem: Education: Goal: Knowledge of General Education information will improve Description: Including pain rating scale, medication(s)/side effects and non-pharmacologic comfort measures Outcome: Not Progressing   Problem: Health Behavior/Discharge Planning: Goal: Ability to manage health-related needs will improve Outcome: Not Progressing   Problem: Elimination: Goal: Will not experience complications related to bowel motility Outcome: Not Progressing   Problem: Skin Integrity: Goal: Risk for impaired skin integrity will decrease Outcome: Not Progressing

## 2023-04-06 NOTE — Plan of Care (Signed)
  Problem: Clinical Measurements: Goal: Ability to maintain clinical measurements within normal limits will improve Outcome: Progressing Goal: Will remain free from infection Outcome: Progressing Goal: Diagnostic test results will improve Outcome: Progressing Goal: Respiratory complications will improve Outcome: Progressing Goal: Cardiovascular complication will be avoided Outcome: Progressing   Problem: Nutrition: Goal: Adequate nutrition will be maintained Outcome: Progressing   Problem: Elimination: Goal: Will not experience complications related to bowel motility Outcome: Progressing

## 2023-04-07 DIAGNOSIS — R7989 Other specified abnormal findings of blood chemistry: Secondary | ICD-10-CM

## 2023-04-07 DIAGNOSIS — E8770 Fluid overload, unspecified: Secondary | ICD-10-CM

## 2023-04-07 DIAGNOSIS — J441 Chronic obstructive pulmonary disease with (acute) exacerbation: Secondary | ICD-10-CM

## 2023-04-07 DIAGNOSIS — J9601 Acute respiratory failure with hypoxia: Secondary | ICD-10-CM | POA: Diagnosis not present

## 2023-04-07 DIAGNOSIS — L8990 Pressure ulcer of unspecified site, unspecified stage: Secondary | ICD-10-CM | POA: Insufficient documentation

## 2023-04-07 DIAGNOSIS — J690 Pneumonitis due to inhalation of food and vomit: Secondary | ICD-10-CM | POA: Diagnosis present

## 2023-04-07 DIAGNOSIS — Z720 Tobacco use: Secondary | ICD-10-CM | POA: Insufficient documentation

## 2023-04-07 DIAGNOSIS — J189 Pneumonia, unspecified organism: Secondary | ICD-10-CM

## 2023-04-07 DIAGNOSIS — K567 Ileus, unspecified: Secondary | ICD-10-CM | POA: Diagnosis present

## 2023-04-07 DIAGNOSIS — D649 Anemia, unspecified: Secondary | ICD-10-CM | POA: Insufficient documentation

## 2023-04-07 DIAGNOSIS — F32A Depression, unspecified: Secondary | ICD-10-CM | POA: Insufficient documentation

## 2023-04-07 DIAGNOSIS — N179 Acute kidney failure, unspecified: Secondary | ICD-10-CM | POA: Diagnosis present

## 2023-04-07 DIAGNOSIS — G9340 Encephalopathy, unspecified: Secondary | ICD-10-CM | POA: Diagnosis present

## 2023-04-07 DIAGNOSIS — A419 Sepsis, unspecified organism: Secondary | ICD-10-CM | POA: Diagnosis present

## 2023-04-07 DIAGNOSIS — I5031 Acute diastolic (congestive) heart failure: Secondary | ICD-10-CM

## 2023-04-07 DIAGNOSIS — J449 Chronic obstructive pulmonary disease, unspecified: Secondary | ICD-10-CM | POA: Insufficient documentation

## 2023-04-07 DIAGNOSIS — I5030 Unspecified diastolic (congestive) heart failure: Secondary | ICD-10-CM | POA: Insufficient documentation

## 2023-04-07 DIAGNOSIS — D696 Thrombocytopenia, unspecified: Secondary | ICD-10-CM

## 2023-04-07 DIAGNOSIS — E872 Acidosis, unspecified: Secondary | ICD-10-CM

## 2023-04-07 DIAGNOSIS — Z9911 Dependence on respirator [ventilator] status: Secondary | ICD-10-CM | POA: Diagnosis not present

## 2023-04-07 DIAGNOSIS — F419 Anxiety disorder, unspecified: Secondary | ICD-10-CM | POA: Insufficient documentation

## 2023-04-07 DIAGNOSIS — D72829 Elevated white blood cell count, unspecified: Secondary | ICD-10-CM

## 2023-04-07 DIAGNOSIS — I2510 Atherosclerotic heart disease of native coronary artery without angina pectoris: Secondary | ICD-10-CM | POA: Insufficient documentation

## 2023-04-07 LAB — CBC WITH DIFFERENTIAL/PLATELET
Abs Immature Granulocytes: 0.1 10*3/uL — ABNORMAL HIGH (ref 0.00–0.07)
Basophils Absolute: 0 10*3/uL (ref 0.0–0.1)
Basophils Relative: 0 %
Eosinophils Absolute: 0.3 10*3/uL (ref 0.0–0.5)
Eosinophils Relative: 2 %
HCT: 27.2 % — ABNORMAL LOW (ref 36.0–46.0)
Hemoglobin: 8.9 g/dL — ABNORMAL LOW (ref 12.0–15.0)
Immature Granulocytes: 1 %
Lymphocytes Relative: 10 %
Lymphs Abs: 1.6 10*3/uL (ref 0.7–4.0)
MCH: 31.2 pg (ref 26.0–34.0)
MCHC: 32.7 g/dL (ref 30.0–36.0)
MCV: 95.4 fL (ref 80.0–100.0)
Monocytes Absolute: 0.9 10*3/uL (ref 0.1–1.0)
Monocytes Relative: 5 %
Neutro Abs: 13.1 10*3/uL — ABNORMAL HIGH (ref 1.7–7.7)
Neutrophils Relative %: 82 %
Platelets: 86 10*3/uL — ABNORMAL LOW (ref 150–400)
RBC: 2.85 MIL/uL — ABNORMAL LOW (ref 3.87–5.11)
RDW: 14.4 % (ref 11.5–15.5)
WBC: 15.9 10*3/uL — ABNORMAL HIGH (ref 4.0–10.5)
nRBC: 0 % (ref 0.0–0.2)

## 2023-04-07 LAB — RENAL FUNCTION PANEL
Albumin: 2.4 g/dL — ABNORMAL LOW (ref 3.5–5.0)
Anion gap: 7 (ref 5–15)
BUN: 14 mg/dL (ref 8–23)
CO2: 24 mmol/L (ref 22–32)
Calcium: 7.6 mg/dL — ABNORMAL LOW (ref 8.9–10.3)
Chloride: 105 mmol/L (ref 98–111)
Creatinine, Ser: 1.21 mg/dL — ABNORMAL HIGH (ref 0.44–1.00)
GFR, Estimated: 51 mL/min — ABNORMAL LOW (ref >60)
Glucose, Bld: 130 mg/dL — ABNORMAL HIGH (ref 70–99)
Phosphorus: 1.8 mg/dL — ABNORMAL LOW (ref 2.5–4.6)
Potassium: 3.8 mmol/L (ref 3.5–5.1)
Sodium: 136 mmol/L (ref 135–145)

## 2023-04-07 LAB — GLUCOSE, CAPILLARY
Glucose-Capillary: 114 mg/dL — ABNORMAL HIGH (ref 70–99)
Glucose-Capillary: 117 mg/dL — ABNORMAL HIGH (ref 70–99)
Glucose-Capillary: 121 mg/dL — ABNORMAL HIGH (ref 70–99)
Glucose-Capillary: 123 mg/dL — ABNORMAL HIGH (ref 70–99)

## 2023-04-07 LAB — TRIGLYCERIDES: Triglycerides: 105 mg/dL (ref <150)

## 2023-04-07 LAB — CULTURE, RESPIRATORY W GRAM STAIN

## 2023-04-07 LAB — APTT: aPTT: 29 seconds (ref 24–36)

## 2023-04-07 LAB — MAGNESIUM: Magnesium: 2.4 mg/dL (ref 1.7–2.4)

## 2023-04-07 MED ORDER — HEPARIN SODIUM (PORCINE) 5000 UNIT/ML IJ SOLN
5000.0000 [IU] | Freq: Three times a day (TID) | INTRAMUSCULAR | Status: DC
  Administered 2023-04-07 – 2023-04-17 (×29): 5000 [IU] via SUBCUTANEOUS
  Filled 2023-04-07 (×29): qty 1

## 2023-04-07 MED ORDER — BISACODYL 10 MG RE SUPP: 10.0000 mg | Freq: Every day | RECTAL | Status: DC | PRN

## 2023-04-07 MED ORDER — GERHARDT'S BUTT CREAM
TOPICAL_CREAM | Freq: Two times a day (BID) | CUTANEOUS | Status: DC
  Administered 2023-04-08 – 2023-04-28 (×8): 1 via TOPICAL
  Filled 2023-04-07 (×3): qty 1

## 2023-04-07 MED ORDER — SODIUM CHLORIDE 0.9 % IV SOLN
500.0000 [IU]/h | INTRAVENOUS | Status: DC
  Administered 2023-04-07 – 2023-04-08 (×2): 500 [IU]/h via INTRAVENOUS_CENTRAL
  Filled 2023-04-07 (×2): qty 10000

## 2023-04-07 MED ORDER — SODIUM PHOSPHATES 45 MMOLE/15ML IV SOLN
30.0000 mmol | Freq: Once | INTRAVENOUS | Status: AC
  Administered 2023-04-07: 30 mmol via INTRAVENOUS
  Filled 2023-04-07: qty 10

## 2023-04-07 MED ORDER — MIDAZOLAM HCL 2 MG/2ML IJ SOLN
2.0000 mg | INTRAMUSCULAR | Status: DC | PRN
  Administered 2023-04-08 – 2023-04-13 (×7): 2 mg via INTRAVENOUS
  Filled 2023-04-07 (×7): qty 2

## 2023-04-07 MED ORDER — PIVOT 1.5 CAL PO LIQD: 1000.0000 mL | ORAL | Status: DC

## 2023-04-07 MED ORDER — PIVOT 1.5 CAL PO LIQD
1000.0000 mL | ORAL | Status: DC
  Administered 2023-04-07 – 2023-04-10 (×4): 1000 mL
  Filled 2023-04-07 (×7): qty 1000

## 2023-04-07 NOTE — TOC Progression Note (Signed)
Transition of Care [facility]) - Progression Note   Patient Details  Name: Tamara Castro MRN: [id number] Date of Birth: 10/18/1960  Transition of Care [facility]) CM/SW Contact  Ewing Schlein, LCSW Phone Number: 04/07/2023, 4:01 PM  Clinical Narrative: [facility] received consult for LTAC and daughter is agreeable to CSW making referrals to [facility] for review. CSW sent messages to [place] with [facility] and Irving Burton with [facility] to have the referral reviewed.    Expected Discharge Plan: Long Term Acute Care (LTAC) (TBD) Barriers to Discharge: Continued Medical Work up  Expected Discharge Plan and Services In-house Referral: Clinical Social Work Living arrangements for the past 2 months: Single Family Home  Social Determinants of Health (SDOH) Interventions SDOH Screenings   Food Insecurity: No Food Insecurity (04/04/2023)  Housing: Low Risk  (04/04/2023)  Transportation Needs: No Transportation Needs (04/04/2023)  Utilities: Not At Risk (04/04/2023)  Alcohol Screen: Low Risk  (04/09/2021)  Depression (PHQ2-9): Low Risk  (04/09/2021)  Financial Resource Strain: Low Risk  (04/09/2021)  Physical Activity: Insufficiently Active (04/09/2021)  Social Connections: Socially Isolated (04/09/2021)  Stress: No Stress Concern Present (04/09/2021)  Tobacco Use: High Risk (04/04/2023)   Readmission Risk Interventions     No data to display

## 2023-04-07 NOTE — Progress Notes (Signed)
eLink Physician-Brief Progress Note Patient Name: Tamara Castro DOB: 15-Apr-1961 MRN: [id number]   Date of Service  04/07/2023  HPI/Events of Note  Watery stools with macerated skin  eICU Interventions  Add Flexiseal, change bisacody to PRN, add hold parametes to PEG, maintain docusate     Intervention Category Minor Interventions: Routine modifications to care plan (e.g. PRN medications for pain, fever)    04/07/2023, 1:03 AM

## 2023-04-07 NOTE — Progress Notes (Signed)
eLink Physician-Brief Progress Note Patient Name: Tamara Castro DOB: [birth year] MRN: [id number]   Date of Service  04/07/2023  HPI/Events of Note  Failed SBT today due to increased work of breathing.  On vent check, patient inappropriately set to a tidal volume of 490 rather than 8 mL/kg (450).  Previously hypercapnic but this is improved.  eICU Interventions  Changed back to 450 cc  Can hold on blood gas for now.  May consider it if she fails SBT again in the morning.     Intervention Category Minor Interventions: Routine modifications to care plan (e.g. PRN medications for pain, fever)    04/07/2023, 9:00 PM

## 2023-04-07 NOTE — Plan of Care (Cosign Needed)

## 2023-04-07 NOTE — Progress Notes (Addendum)
NAME:  Tamara Castro, MRN:  [id number], DOB:  Oct 03, 1960, LOS: 6 ADMISSION DATE:  04/01/2023, CONSULTATION DATE:  04/01/23 REFERRING MD:  Salvadore Dom, CHIEF COMPLAINT:  resp failure   History of Present Illness:  HPI obtained from EMR review as patient is intubated and sedated on MV.   62 yoF with PMH as below who presented to [facility] on 7/5 after 1 week hx of URI symptoms admitted for acute hypoxic and hypercarbic respiratory failure 2/2 RLL PNA requiring intubation, septic shock, AKI and acute heart failure.  Found to have pBNP of 49k with elevated troponin's thought secondary to demand ischemia with echo with normal EF, reduced RV, indeterminate diastolic function.  CTA neg for PE 7/5.  CTH neg 7/9.  Covered with azithromycin/ ceftriaxone, switched to cefepime on 7/7.  Shock and AKI improved and extubated 7/7 but required BiPAP.  Required reintubation 7/10 for respiratory failure.  Increasing WBC 7/13, placed on doxycyline switched to vancomycin with elevated PCT with CXR showing increased bibasilar atelectasis vs infiltrates with increasing pleural effusions.  Initial blood culture x1 w/ stap epi, thought contaminate with repeat BC 7/7 negative.  Some concern for ileus with high OGT residuals with CT a/p showing slightly worse dilation of suspected chronic ileus with prior surgical changes but no transition point, possible emesis of TF since held, remains, NPO on reglan.  Patient has failed SBT for several days, due to apnea despite decreased sedation.  Transferred to [facility] ICU for further care, admitted to PCCM.   Pertinent  Medical History  Tobacco abuse,  COPD, chronic constipation, prior colectomy in 1990's 2/2 diverticulitis, fibromyalgia (on disability since 2008), depression, anxiety, neuropathy  Home meds> xanax (1mg  QID), lexapro, gabapentin, anoro elliptia, zofran  Significant Hospital Events: Including procedures, antibiotic start and stop dates in addition to other pertinent events   7/5  admitted [facility] CTX and azith started 7/7 extubated. Vanc added and cefepime started; CTX stopped 7/8 vanc stopped 7/10 reintubated  7/16 tx to [facility]. Cefepime stopped 7/17 attempting lasix. Creatinine worse.  Echo EF 60-65%no WMA RV nml  7/18 cr worse after lasix. Weaning but on-going ileus and cr rising presenting obstacle to extubation Trying neostigmine.  7/19 scr worse in spite of holding lasix. Still on low dose precedex. Mental status and renal fxn still barrier to extubation. Nephro consulted > started CRRT.  HD catheter placed. 7/20 Swisher heparin stopped d/t bleeding around HD cath. Got DDAVP.  7/21 resumes cefepime for worsening leukocytosis.  7/22 volumes ok w/ SBT but has sig accessory use    Interim History / Subjective:  Failed SBT attempt this am   Objective   Blood pressure (Abnormal) 63/44, pulse 83, temperature 98.1 F (36.7 C), temperature source Axillary, resp. rate 20, height 5\' 5"  (1.651 m), weight 88.1 kg, SpO2 95%. CVP:  [5 mmHg-24 mmHg] 16 mmHg  Vent Mode: PRVC FiO2 (%):  [30 %] 30 % Set Rate:  [20 bmp] 20 bmp Vt Set:  [490 mL] 490 mL PEEP:  [5 cmH20] 5 cmH20 Pressure Support:  [5 cmH20] 5 cmH20 Plateau Pressure:  [17 cmH20-20 cmH20] 17 cmH20   Intake/Output Summary (Last 24 hours) at 04/07/2023 0944 Last data filed at 04/07/2023 0900 Gross per 24 hour  Intake 2531.84 ml  Output 5708 ml  Net -3176.16 ml   Filed Weights   04/05/23 0428 04/06/23 0500 04/07/23 0417  Weight: 95.8 kg 92.7 kg 88.1 kg   Examination: General 62 year old female currently back on full vent support.  We had previously tapered sedation and attempted SBT but after about 30 minutes developed accessory use  HENT NCAT orally intubated. Internal jugular CVL and HD cath dressing CD&I Pulm some scattered rhonchi, decreased bases. PCXR on 7/20 showed Bilateral basilar airspace disease. W/ possible element of effusion Card rrr Abd soft, + BMs now has flexi seal. Tolerating tubefeeds GU minimal  UOP Ext warm diffuse anasarca persists but has improved. Brisk CR  Neuro w/ fent and prop off she moves all ext and can follow commands. Does get anxious easily. Might have some left sided weakness   Resolved Hospital Problem list   Hypernatremia, resolved Sepsis & septic shock 2/2 moraxella pneumonia Contaminated blood culture X 1   Assessment & Plan:   Acute hypoxic and hypercarbic respiratory failure 2/2 AECOPD, Moraxella PNA, pleural effusions, and r/o HCAP vs new aspiration component  Tobacco abuse - Goal net negative volume balance, still pulling with CRRT -failed SBT due to marked increased WOB w/ accessory use this am.  Plan Cont full vent support w/ daily SBT Repeat am CXR VAP bundle  PAD protocol RASS goal -1  Abx as below  Cont nebs Cont to push volume removal  Smoking cessation after she is off vent  Try to get more volume off, then hope extubation trial again in next 24-48hrs; TOC consult placed.  If fails extubation needs trach (id do not think this would be needed indef)    Leukocytosis w/out fever. PCT reassuring.  Fever curve unreliable on CRRT. WBC has dropped a little. Cultures to date showing few yeast, and mod GPC in clusters but MRSA PCR neg Plan Day 2 Cefepime F/u pending cultures  Am CBC  CAD Elevated troponins thought secondary to demand ischemia Prolonged QTc Acute HF was entered on error on 7/18 Plan Cont asa and statin  Cont tele   AKI, concern for shock related ATN complicated further by  IV contrast 7/11; now w/ progressive metabolic acidosis .  Failed diuretic challenge, now requiring CRRT Now w/ negative fluid balance. Down 10 lbs. Scr improved/BUN normalized.  Plan Cont to push volume removal  Renal dose meds Strict I&O Serial labs  Chronic ileus -->improved post neostigmine on 7/18. Has had daily BMs now and actually has rectal tube in place.  Risk of Aspiration Plan Will slowly advance tube feeds Cont bowel regimen Cont reglan  every 8 hrs Ensure K > 4  Daily Miralax  Cont colase   Normocytic anemia Thrombocytopenia - ddx sepsis/ critical illness  Platelets stable over last couple of days. No obvious bleeding. CVL dressing CD&I Plan Cont to trend CBC and monitor for gross blood loss Trigger for transfusion hgb < 7  If she starts bleeding around her line again can try TXA or an additional dose of DDAVP. Will resume [place] heparin (stopped 7/20)   Acute metabolic encephalopathy superimposed on Anxiety & depression- benzo dependent Plan Continue PTA Lexapro and Xanax Cont Diprovan, fent and precedex. With transition to precedex and PRN fent during next SBT attempt on 7/23  Best Practice (right click and "Reselect all SmartList Selections" daily)   Diet/type: tubefeeds-trickle-->advance DVT prophylaxis: prophylactic heparin  GI prophylaxis: PPI Lines: Central line Foley:  Yes, and it is still needed Code Status:  full code Last date of multidisciplinary goals of care discussion [ ]   Daughter [name] [phone]  My cct 45 min   [name] ACNP-BC [facility] Pulmonary/Critical Care Pager # [phone] OR # [phone] if no answer

## 2023-04-07 NOTE — Plan of Care (Signed)
  Problem: Education: Goal: Knowledge of General Education information will improve Description: Including pain rating scale, medication(s)/side effects and non-pharmacologic comfort measures Outcome: Not Progressing   Problem: Clinical Measurements: Goal: Ability to maintain clinical measurements within normal limits will improve Outcome: Not Progressing Goal: Respiratory complications will improve Outcome: Not Progressing   Problem: Pain Managment: Goal: General experience of comfort will improve Outcome: Not Progressing

## 2023-04-07 NOTE — IPAL (Signed)
  Interdisciplinary Goals of Care Family Meeting   Date carried out: 04/07/2023  Location of the meeting: Bedside  Member's involved: Nurse Practitioner, Bedside Registered Nurse, and Family Member or next of kin  Durable Power of Attorney or acting medical decision maker: Rodolph Bong (daughter)   Discussion: We discussed goals of care for Tamara Castro .  Her daughter Tamara Castro and cousin [name] were at bedside. We discussed current diagnosis. This included: respiratory failure, difficulty weaning, kidney failure with on-going dialysis and the unknown if she will make renal recovery. We also discussed her degree of deconditioning and malnutrition which in addition to the volume overload are likely larger barriers to coming off vent. I talked about three scenarios. 1) extubated. Does well off vent. Continues nutritional support, physical therapy and transition to iHD and hope recovers but be prepared for ESRD option 2) extubate, if fails re-intubate. Go forward with trach with understanding that this could be a several week process (maybe even up to 8 weeks or more) with the ideal scenario she continues weaning, works with Physical therapy and improves her nutritional status enough to decannulate. I did warn them that if she does not come off vent disposition for trach and dialysis patients are complicated and the only nursing home option would be [facility] locally with the risk of need to transfer out of state if could not come off vent and have trach removed. Option 3) do not re-intubated and transition to comfort if fails extubation.  Tamara Castro feels like her mother would want tracheostomy if there was a chance that things could improve. She also feels like she would accept long term dialysis if needed. She does not think she would want prolonged vent or life long nursing home and said she would want to re-visit goals of care should things look that way  Code status:   Code Status: Full Code  BUT  plan is to transition to DNR after decision to trach (I think will likely be needed)   Disposition: Continue current acute care  Time spent for the meeting: 35 minutes    Shelby Mattocks, NP  04/07/2023, 4:13 PM

## 2023-04-07 NOTE — Progress Notes (Signed)
[facility] Progress Note  Subjective: I/O yest net engative 2.7, and net neg 2.0 L so far today.   Vitals:   04/07/23 0645 04/07/23 0700 04/07/23 0715 04/07/23 0730  BP:      Pulse: 76 82 85 83  Resp: 20 20 20 20   Temp:    98.1 F (36.7 C)  TempSrc:    Axillary  SpO2: 95% 95% 94% 95%  Weight:      Height:        Exam: General: Critically ill looking female, intubated, sedated Heart:RRR, s1s2 nl Lungs: Coarse breath sound bilateral Abdomen:soft, non-distended Extremities: Upper and lower extremities pitting edema present+ Dialysis Access: Right IJ temp cath in place      Net I/O - 9L in and 16 L out = net neg 7 L    UOP marginal < 100 cc/d   Wt's 91kg on admit, peak 95kg, today 88kg   Labs today > Na 136 K+ 3.8   CO2 7.6  crt 1.21  bun 14      Date   Creat  eGFR     Aug 2022  1.18 Sep 2021  1.04     03/21/23  1.63      7/06   1.72     7/07   1.44       7/08   1.13     7/09   1.35       7/10   1.17     7/11   1.04     7/12   1.00        7/13   1.06     7/14   1.38     03/31/23  1.48       7/16   2.26     7/18   3.45     7/19   3.84  CRRT started     7/20   2.82     04/06/23  1.65     04/07/23  1.21    Assessment/ Plan: Acute kidney injury - b/l creat 1.0 from jan 2023. Creat here was 1.6 on admission in setting of pneumonia, hypotension and septic shock. Creat peaked at 1.7 and then returned to 1.0 on 7/12. Renal failure worsened again on 7/15 up to 1.48 and then up to 3.84 on on 7/19. CRRT started 7/19. AKI felt to be related to hypotension and IV contrast + some diuretics given. Pt remains oliguric. Cont CRRT for now.  Acute hypoxic respiratory failure - due to COPD/ pna, on vent per CCM.  Sepsis - initial dx on admission w/ pna and septic shock Volume/lower extremity edema - d/w CCM, still sig UE/ LE edema --> will ^ UF to 250 cc/ hr as tolerated.  Exit site bleeding - received DDAVP and heparin put on hold. Anemia - Hb 8- 10 range,  tranfuse prn.  Metabolic acidosis - Improved with CRRT.  Hypophosphatemia - received repletion, follow COPD Anxiety/ depression     Vinson Moselle MD  [facility] 04/07/2023, 10:38 AM  Recent Labs  Lab 04/06/23 0336 04/06/23 2031 04/07/23 0417  HGB 9.5*  --  8.9*  ALBUMIN 2.5*  2.4* 2.3* 2.4*  CALCIUM 7.7* 7.5* 7.6*  PHOS 1.5*  1.5* 1.8* 1.8*  CREATININE 1.65* 1.29* 1.21*  K 3.8 3.6 3.8   No results for input(s): "IRON", "TIBC", "FERRITIN" in the last 168 hours. Inpatient medications:  ALPRAZolam  0.5 mg Per Tube TID   arformoterol  15 mcg Nebulization BID   aspirin  81 mg Per Tube Daily   atorvastatin  20 mg Per Tube Daily   budesonide (PULMICORT) nebulizer solution  0.5 mg Nebulization BID   Chlorhexidine Gluconate Cloth  6 each Topical Daily   docusate  100 mg Per Tube BID   escitalopram  10 mg Per Tube Daily   feeding supplement (PIVOT 1.5 CAL)  1,000 mL Per Tube Q24H   Gerhardt's butt cream   Topical BID   heparin injection (subcutaneous)  5,000 Units Subcutaneous Q8H   insulin aspart  0-6 Units Subcutaneous Q4H   metoCLOPramide (REGLAN) injection  5 mg Intravenous Q8H   multivitamin  1 tablet Per Tube QHS   mouth rinse  15 mL Mouth Rinse Q2H   pantoprazole (PROTONIX) IV  40 mg Intravenous Q12H   polyethylene glycol  17 g Per NG tube Daily   revefenacin  175 mcg Nebulization Daily   sodium chloride flush  10-40 mL Intracatheter Q12H     prismasol BGK 4/2.5 400 mL/hr at 04/06/23 2305    prismasol BGK 4/2.5 400 mL/hr at 04/06/23 2305   sodium chloride 10 mL/hr at 04/07/23 1000   ceFEPime (MAXIPIME) IV Stopped (04/07/23 1610)   dexmedetomidine (PRECEDEX) IV infusion 1 mcg/kg/hr (04/07/23 1000)   fentaNYL infusion INTRAVENOUS 100 mcg/hr (04/07/23 1000)   norepinephrine (LEVOPHED) Adult infusion 2 mcg/min (04/07/23 1000)   prismasol BGK 4/2.5 1,500 mL/hr at 04/07/23 0857   propofol (DIPRIVAN) infusion 15 mcg/kg/min (04/07/23 1000)   sodium phosphate 30 mmol in dextrose 5  % 250 mL infusion 43 mL/hr at 04/07/23 1000   acetaminophen (TYLENOL) oral liquid 160 mg/5 mL, albuterol, bisacodyl, fentaNYL, fentaNYL (SUBLIMAZE) injection, fentaNYL (SUBLIMAZE) injection, heparin, lip balm, midazolam, mouth rinse, phenol, pneumococcal 20-valent conjugate vaccine, polyvinyl alcohol, sodium chloride, sodium chloride flush

## 2023-04-08 ENCOUNTER — Inpatient Hospital Stay (HOSPITAL_COMMUNITY): Payer: 59

## 2023-04-08 DIAGNOSIS — J9601 Acute respiratory failure with hypoxia: Secondary | ICD-10-CM | POA: Diagnosis not present

## 2023-04-08 DIAGNOSIS — Z9911 Dependence on respirator [ventilator] status: Secondary | ICD-10-CM | POA: Diagnosis not present

## 2023-04-08 LAB — GLUCOSE, CAPILLARY
Glucose-Capillary: 104 mg/dL — ABNORMAL HIGH (ref 70–99)
Glucose-Capillary: 106 mg/dL — ABNORMAL HIGH (ref 70–99)
Glucose-Capillary: 120 mg/dL — ABNORMAL HIGH (ref 70–99)
Glucose-Capillary: 123 mg/dL — ABNORMAL HIGH (ref 70–99)
Glucose-Capillary: 118 mg/dL — ABNORMAL HIGH (ref 70–99)
Glucose-Capillary: 154 mg/dL — ABNORMAL HIGH (ref 70–99)
Glucose-Capillary: 129 mg/dL — ABNORMAL HIGH (ref 70–99)
Glucose-Capillary: 107 mg/dL — ABNORMAL HIGH (ref 70–99)

## 2023-04-08 LAB — RENAL FUNCTION PANEL
Albumin: 2.6 g/dL — ABNORMAL LOW (ref 3.5–5.0)
Anion gap: 6 (ref 5–15)
BUN: 11 mg/dL (ref 8–23)
CO2: 24 mmol/L (ref 22–32)
Calcium: 7.6 mg/dL — ABNORMAL LOW (ref 8.9–10.3)
Chloride: 104 mmol/L (ref 98–111)
Creatinine, Ser: 0.98 mg/dL (ref 0.44–1.00)
GFR, Estimated: >60 mL/min (ref >60)
Glucose, Bld: 127 mg/dL — ABNORMAL HIGH (ref 70–99)
Phosphorus: 2.1 mg/dL — ABNORMAL LOW (ref 2.5–4.6)
Potassium: 3.8 mmol/L (ref 3.5–5.1)
Sodium: 134 mmol/L — ABNORMAL LOW (ref 135–145)

## 2023-04-08 LAB — CBC
HCT: 29.8 % — ABNORMAL LOW (ref 36.0–46.0)
Hemoglobin: 9.4 g/dL — ABNORMAL LOW (ref 12.0–15.0)
MCH: 30.9 pg (ref 26.0–34.0)
MCHC: 31.5 g/dL (ref 30.0–36.0)
MCV: 98 fL (ref 80.0–100.0)
Platelets: 106 10*3/uL — ABNORMAL LOW (ref 150–400)
RBC: 3.04 MIL/uL — ABNORMAL LOW (ref 3.87–5.11)
RDW: 14.8 % (ref 11.5–15.5)
WBC: 18.9 10*3/uL — ABNORMAL HIGH (ref 4.0–10.5)
nRBC: 0 % (ref 0.0–0.2)

## 2023-04-08 LAB — MAGNESIUM: Magnesium: 2.3 mg/dL (ref 1.7–2.4)

## 2023-04-08 LAB — APTT: aPTT: 52 seconds — ABNORMAL HIGH (ref 24–36)

## 2023-04-08 LAB — CULTURE, RESPIRATORY W GRAM STAIN

## 2023-04-08 MED ORDER — SODIUM CHLORIDE 0.9 % IV SOLN
2.0000 g | Freq: Three times a day (TID) | INTRAVENOUS | Status: DC
  Administered 2023-04-08 – 2023-04-09 (×2): 2 g via INTRAVENOUS
  Filled 2023-04-08 (×2): qty 12.5

## 2023-04-08 MED ORDER — ROCURONIUM BROMIDE 50 MG/5ML IV SOLN
100.0000 mg | Freq: Once | INTRAVENOUS | Status: AC
  Filled 2023-04-08: qty 10

## 2023-04-08 MED ORDER — QUETIAPINE FUMARATE 25 MG PO TABS
25.0000 mg | ORAL_TABLET | Freq: Two times a day (BID) | ORAL | Status: DC
  Administered 2023-04-08 – 2023-04-10 (×6): 25 mg via ORAL
  Filled 2023-04-08 (×6): qty 1

## 2023-04-08 MED ORDER — MIDAZOLAM HCL 2 MG/2ML IJ SOLN
5.0000 mg | Freq: Once | INTRAMUSCULAR | Status: AC
  Administered 2023-04-08: 2 mg via INTRAVENOUS
  Filled 2023-04-08: qty 6

## 2023-04-08 MED ORDER — FENTANYL CITRATE (PF) 100 MCG/2ML IJ SOLN
200.0000 ug | Freq: Once | INTRAMUSCULAR | Status: AC
  Administered 2023-04-08: 100 ug via INTRAVENOUS
  Filled 2023-04-08: qty 4

## 2023-04-08 MED ORDER — SODIUM PHOSPHATES 45 MMOLE/15ML IV SOLN
30.0000 mmol | Freq: Once | INTRAVENOUS | Status: AC
  Administered 2023-04-08: 30 mmol via INTRAVENOUS
  Filled 2023-04-08: qty 10

## 2023-04-08 MED ORDER — ALPRAZOLAM 0.5 MG PO TABS
1.0000 mg | ORAL_TABLET | Freq: Four times a day (QID) | ORAL | Status: DC
  Administered 2023-04-08 – 2023-04-10 (×11): 1 mg
  Filled 2023-04-08 (×12): qty 2

## 2023-04-08 MED ORDER — ROCURONIUM BROMIDE 10 MG/ML (PF) SYRINGE
PREFILLED_SYRINGE | INTRAVENOUS | Status: AC
  Administered 2023-04-08: 100 mg via INTRAVENOUS
  Filled 2023-04-08: qty 10

## 2023-04-08 MED ORDER — LIDOCAINE-EPINEPHRINE 1 %-1:100000 IJ SOLN
20.0000 mL | Freq: Once | INTRAMUSCULAR | Status: AC
  Administered 2023-04-08: 10 mL via INTRADERMAL
  Filled 2023-04-08: qty 1

## 2023-04-08 MED ORDER — LACTATED RINGERS IV BOLUS
1000.0000 mL | Freq: Once | INTRAVENOUS | Status: AC
  Administered 2023-04-08: 1000 mL via INTRAVENOUS

## 2023-04-08 MED ORDER — ETOMIDATE 2 MG/ML IV SOLN
20.0000 mg | Freq: Once | INTRAVENOUS | Status: AC
  Administered 2023-04-08: 20 mg via INTRAVENOUS
  Filled 2023-04-08: qty 10

## 2023-04-08 NOTE — Procedures (Signed)
Diagnostic Bronchoscopy  Tamara Castro  [id number]  1961/03/04  Date:04/08/23  Time:3:25 PM   Provider Performing:Pete E Tanja Port   Procedure: Diagnostic Bronchoscopy (56387)  Indication(s) Assist with direct visualization of tracheostomy placement  Consent Risks of the procedure as well as the alternatives and risks of each were explained to the patient and/or caregiver.  Consent for the procedure was obtained.   Anesthesia See separate tracheostomy note   Time Out Verified patient identification, verified procedure, site/side was marked, verified correct patient position, special equipment/implants available, medications/allergies/relevant history reviewed, required imaging and test results available.   Sterile Technique Usual hand hygiene, masks, gowns, and gloves were used   Procedure Description Bronchoscope advanced through endotracheal tube and into airway.  After suctioning out tracheal secretions, bronchoscope used to provide direct visualization of tracheostomy placement.   Complications/Tolerance None; patient tolerated the procedure well.   EBL None  Specimen(s) None   Tamara Castro ACNP-BC [facility] Pulmonary/Critical Care Pager # [phone] OR # [phone] if no answer

## 2023-04-08 NOTE — Progress Notes (Signed)
Update  Failed SBT. Volumes marginal but acceptable from SBT stand point, however Had marked accessory use (diaphragmatic involvement and nasal flare). She endorses she is short of breath but the tube isn't bothering her, denies being anxious or in pain. Therefore  weaning attempt aborted.  Following this sedation was increased. BP has been somewhat labile. I suspect vascularly she's dry now (although total volume status still overloaded) Plan Cont to titrate NE Fluid bolus Keep even today (ill notify nephro)  Trach later this afternoon (awaiting daughter for consent)   [name] ACNP-BC [facility] Pulmonary/Critical Care Pager # [phone] OR # [phone] if no answer

## 2023-04-08 NOTE — Plan of Care (Signed)
  Problem: Clinical Measurements: Goal: Will remain free from infection Outcome: Progressing   Problem: Nutrition: Goal: Adequate nutrition will be maintained Outcome: Progressing   Problem: Clinical Measurements: Goal: Ability to maintain clinical measurements within normal limits will improve Outcome: Not Progressing Goal: Respiratory complications will improve Outcome: Not Progressing Goal: Cardiovascular complication will be avoided Outcome: Not Progressing   Problem: Coping: Goal: Level of anxiety will decrease Outcome: Not Progressing

## 2023-04-08 NOTE — Procedures (Signed)
Percutaneous Tracheostomy Procedure Note   Tamara Castro  [id number]  Jan 18, 1961  Date:04/08/23  Time:2:57 PM   Provider Performing:   Procedure: Percutaneous Tracheostomy with Bronchoscopic Guidance (59563)  Indication(s) Acute respiratory failure  Consent Risks of the procedure as well as the alternatives and risks of each were explained to the patient and/or caregiver.  Consent for the procedure was obtained.  Anesthesia Etomidate, Versed, Fentanyl, Vecuronium   Time Out Verified patient identification, verified procedure, site/side was marked, verified correct patient position, special equipment/implants available, medications/allergies/relevant history reviewed, required imaging and test results available.   Sterile Technique Maximal sterile technique including sterile barrier drape, hand hygiene, sterile gown, sterile gloves, mask, hair covering.    Procedure Description Appropriate anatomy identified by palpation.  Patient's neck prepped and draped in sterile fashion.  1% lidocaine with epinephrine was used to anesthetize skin overlying neck.  1.5cm incision made and blunt dissection performed until tracheal rings could be easily palpated.   Then a size 6 Shiley tracheostomy was placed under bronchoscopic visualization using usual Seldinger technique and serial dilation.   Bronchoscope confirmed placement above the carina.  Tracheostomy was sutured in place with adhesive pad to protect skin under pressure.    Patient connected to ventilator.   Complications/Tolerance None; patient tolerated the procedure well. Chest X-ray is ordered to confirm no post-procedural complication.   EBL Minimal   Specimen(s) None

## 2023-04-08 NOTE — Plan of Care (Signed)

## 2023-04-08 NOTE — TOC Transition Note (Addendum)
Transition of Care [facility]) - CM/SW Discharge Note   Patient Details  Name: Tamara Castro MRN: [id number] Date of Birth: 08-Mar-1961  Transition of Care [facility]) CM/SW Contact:  Coralyn Helling, LCSW Phone Number: 04/08/2023, 9:46 AM   Clinical Narrative:    Patient has to be extubated and off CRRT and tolerating HD to be able to go to [facility]. She has options if she meets those criteria. TOC notified attending and floor RN. Will get choice from family if patient an meet conditions.   [facility]     Barriers to Discharge: Continued Medical Work up   Patient Goals and CMS Choice      Discharge Placement                         Discharge Plan and Services Additional resources added to the After Visit Summary for   In-house Referral: Clinical Social Work                                   Social Determinants of Health (SDOH) Interventions SDOH Screenings   Food Insecurity: No Food Insecurity (04/04/2023)  Housing: Low Risk  (04/04/2023)  Transportation Needs: No Transportation Needs (04/04/2023)  Utilities: Not At Risk (04/04/2023)  Alcohol Screen: Low Risk  (04/09/2021)  Depression (PHQ2-9): Low Risk  (04/09/2021)  Financial Resource Strain: Low Risk  (04/09/2021)  Physical Activity: Insufficiently Active (04/09/2021)  Social Connections: Socially Isolated (04/09/2021)  Stress: No Stress Concern Present (04/09/2021)  Tobacco Use: High Risk (04/04/2023)     Readmission Risk Interventions     No data to display

## 2023-04-08 NOTE — IPAL (Signed)
  Interdisciplinary Goals of Care Family Meeting   Date carried out: 04/08/2023  Location of the meeting: Bedside  Member's involved: Nurse Practitioner, Bedside Registered Nurse, and Family Member or next of kin  Durable Power of Attorney or acting medical decision maker: Rodolph Bong   Discussion: We discussed goals of care for Tamara Castro .   Picking up from [facility] 7/22. Barkley Bruns has reached out to nursing staff to request that we change to DNR status based on our discussion from 7/22. We will still continue to treat but should she suffer cardiac arrest she would be DNR  Code status:   Code Status: DNR   Disposition: Continue current acute care  Time spent for the meeting: 12 min     Shelby Mattocks, NP  04/08/2023, 4:53 PM

## 2023-04-08 NOTE — Progress Notes (Signed)
Per provider pt placed on 01/18/29% for sbt, pt VT's 356, rr 27, hr 120 sats 91%, per MD sbt stopped after 5 mins due to WOB

## 2023-04-08 NOTE — Progress Notes (Signed)
[facility] Progress Note  Subjective: I/O net neg 4.2 L yesterday. Had some problems w/ BP drops though so UF currently is net even.   Vitals:   04/08/23 1100 04/08/23 1116 04/08/23 1200 04/08/23 1522  BP:   (!) 206/99   Pulse: 71 71 80   Resp: 19 19 12    Temp:   98.3 F (36.8 C)   TempSrc:   Oral   SpO2: 95% 100% 97% 100%  Weight:      Height:        Exam: General: Critically ill looking female, intubated, sedated Heart:RRR, s1s2 nl Lungs: Coarse breath sound bilateral Abdomen:soft, non-distended Extremities: 1-2+ bilat UE/ LE edema Dialysis Access: Right IJ temp cath in place      Date   Creat  eGFR     Aug 2022  1.18 Sep 2021  1.04     03/21/23  1.63      7/06   1.72     7/07   1.44       7/08   1.13     7/09   1.35       7/10   1.17     7/11   1.04     7/12   1.00        7/13   1.06     7/14   1.38     03/31/23  1.48       7/16   2.26     7/18   3.45     7/19   3.84  CRRT started     7/20   2.82     04/06/23  1.65     04/07/23  1.21    UA 7/16 - mod Hb, ket 5, 100 prot, rare bact, 11-20rbc, 21-50 wbc, 0-5 epi    Assessment/ Plan: Acute kidney injury - b/l creat 1.0 from jan 2023. Creat here was 1.6 on admission in setting of pneumonia, hypotension and septic shock. Creat peaked at 1.7 and then returned to 1.0 on 7/12. Renal failure worsened again on 7/15 up to 1.48 and then up to 3.84 on on 7/19. CRRT started 7/19. AKI felt to be related to hypotension and IV contrast + some diuretics given. Pt remains oliguric. Plan is for CRRT holiday x 1-2 days, see if any renal function will recover.  Acute hypoxic respiratory failure - due to COPD/ pna, on vent per CCM. F/u CXR today 7/23 is negative for edema.   Sepsis - initial dx on admission w/ pna and septic shock. Was on low dose NE gtt yesterday but doses are higher after BP drop this morning.  Volume/lower extremity edema - d/w CCM, had net neg 4 L yest but w/ BP drop will keep even today. Wts  are down 4-8kg.  Exit site bleeding - received DDAVP Anemia - Hb 8- 10 range, tranfuse prn.  Metabolic acidosis - Improved with CRRT.  Hypophosphatemia - received repletion, follow COPD Anxiety/ depression   Vinson Moselle MD  [facility] 04/08/2023, 12:32 PM  Recent Labs  Lab 04/07/23 0417 04/08/23 0506  HGB 8.9* 9.4*  ALBUMIN 2.4* 2.6*  CALCIUM 7.6* 7.6*  PHOS 1.8* 2.1*  CREATININE 1.21* 0.98  K 3.8 3.8   No results for input(s): "IRON", "TIBC", "FERRITIN" in the last 168 hours. Inpatient medications:  ALPRAZolam  1 mg Per Tube QID   arformoterol  15 mcg Nebulization BID   aspirin  81 mg Per Tube  Daily   atorvastatin  20 mg Per Tube Daily   budesonide (PULMICORT) nebulizer solution  0.5 mg Nebulization BID   Chlorhexidine Gluconate Cloth  6 each Topical Daily   docusate  100 mg Per Tube BID   escitalopram  10 mg Per Tube Daily   feeding supplement (PIVOT 1.5 CAL)  1,000 mL Per Tube Q24H   Gerhardt's butt cream   Topical BID   heparin injection (subcutaneous)  5,000 Units Subcutaneous Q8H   insulin aspart  0-6 Units Subcutaneous Q4H   metoCLOPramide (REGLAN) injection  5 mg Intravenous Q8H   multivitamin  1 tablet Per Tube QHS   mouth rinse  15 mL Mouth Rinse Q2H   pantoprazole (PROTONIX) IV  40 mg Intravenous Q12H   polyethylene glycol  17 g Per NG tube Daily   QUEtiapine  25 mg Oral BID   revefenacin  175 mcg Nebulization Daily   sodium chloride flush  10-40 mL Intracatheter Q12H     prismasol BGK 4/2.5 400 mL/hr at 04/08/23 0104    prismasol BGK 4/2.5 400 mL/hr at 04/08/23 0103   sodium chloride 5 mL/hr at 04/08/23 1300   ceFEPime (MAXIPIME) IV 2 g (04/08/23 1529)   dexmedetomidine (PRECEDEX) IV infusion 0.4 mcg/kg/hr (04/08/23 1300)   fentaNYL infusion INTRAVENOUS 100 mcg/hr (04/08/23 1300)   heparin 10,000 units/ 20 mL infusion syringe 500 Units/hr (04/08/23 1300)   norepinephrine (LEVOPHED) Adult infusion 14 mcg/min (04/08/23 1300)   prismasol BGK 4/2.5 1,500 mL/hr at  04/08/23 1228   propofol (DIPRIVAN) infusion 25 mcg/kg/min (04/08/23 1300)   acetaminophen (TYLENOL) oral liquid 160 mg/5 mL, albuterol, bisacodyl, fentaNYL, fentaNYL (SUBLIMAZE) injection, fentaNYL (SUBLIMAZE) injection, lip balm, midazolam, mouth rinse, phenol, pneumococcal 20-valent conjugate vaccine, polyvinyl alcohol, sodium chloride, sodium chloride flush

## 2023-04-08 NOTE — Progress Notes (Signed)
NAME:  Tamara Castro, MRN:  [id number], DOB:  04/25/1961, LOS: 7 ADMISSION DATE:  04/01/2023, CONSULTATION DATE:  04/01/2023 REFERRING MD:  Salvadore Dom , CHIEF COMPLAINT:  Respiratory Failure   History of Present Illness:  HPI obtained from EMR review as patient is intubated and sedated on MV.    59 yoF with PMH as below who presented to [facility] on 7/5 after 1 week hx of URI symptoms admitted for acute hypoxic and hypercarbic respiratory failure 2/2 RLL PNA requiring intubation, septic shock, AKI and acute heart failure.  Found to have pBNP of 49k with elevated troponin's thought secondary to demand ischemia with echo with normal EF, reduced RV, indeterminate diastolic function.  CTA neg for PE 7/5.  CTH neg 7/9.  Covered with azithromycin/ ceftriaxone, switched to cefepime on 7/7.  Shock and AKI improved and extubated 7/7 but required BiPAP.  Required reintubation 7/10 for respiratory failure.  Increasing WBC 7/13, placed on doxycyline switched to vancomycin with elevated PCT with CXR showing increased bibasilar atelectasis vs infiltrates with increasing pleural effusions.  Initial blood culture x1 w/ stap epi, thought contaminate with repeat BC 7/7 negative.  Some concern for ileus with high OGT residuals with CT a/p showing slightly worse dilation of suspected chronic ileus with prior surgical changes but no transition point, possible emesis of TF since held, remains, NPO on reglan.  Patient has failed SBT for several days, due to apnea despite decreased sedation.  Transferred to [facility] ICU for further care, admitted to PCCM.  Pertinent  Medical History  Tobacco abuse,  COPD, chronic constipation, prior colectomy in 1990's 2/2 diverticulitis, fibromyalgia (on disability since 2008), depression, anxiety, neuropathy   Home meds> xanax (1mg  QID), lexapro, gabapentin, anoro elliptia, zofran  Significant Hospital Events: Including procedures, antibiotic start and stop dates in addition to other pertinent  events   7/5 admitted [facility] CTX and azith started 7/7 extubated. Vanc added and cefepime started; CTX stopped 7/8 vanc stopped 7/10 reintubated  7/16 tx to [facility]. Cefepime stopped 7/17 attempting lasix. Creatinine worse.  Echo EF 60-65%no WMA RV nml  7/18 cr worse after lasix. Weaning but on-going ileus and cr rising presenting obstacle to extubation Trying neostigmine.  7/19 scr worse in spite of holding lasix. Still on low dose precedex. Mental status and renal fxn still barrier to extubation. Nephro consulted > started CRRT.  HD catheter placed. 7/20 [place] heparin stopped d/t bleeding around HD cath. Got DDAVP.  7/21 resumes cefepime for worsening leukocytosis.  7/22 volumes ok w/ SBT but has sig accessory use  7/23- increased Xanax to home dose 1mg  QID- added seroquel 25mg  BID  Interim History / Subjective:  Patient notably failed an SBT attempt on 7/23 due to accessory muscle use and fatigue. Likely will require trach  Objective   Blood pressure 95/66, pulse 80, temperature 99.1 F (37.3 C), temperature source Axillary, resp. rate 19, height 5\' 5"  (1.651 m), weight 83.8 kg, SpO2 94%. CVP:  [8 mmHg-19 mmHg] 18 mmHg  Vent Mode: PRVC FiO2 (%):  [30 %] 30 % Set Rate:  [20 bmp] 20 bmp Vt Set:  [450 mL-490 mL] 450 mL PEEP:  [5 cmH20] 5 cmH20 Pressure Support:  [5 cmH20] 5 cmH20 Plateau Pressure:  [13 cmH20-20 cmH20] 19 cmH20   Intake/Output Summary (Last 24 hours) at 04/08/2023 0754 Last data filed at 04/08/2023 0700 Gross per 24 hour  Intake 2573.19 ml  Output 7471 ml  Net -4897.81 ml   Filed Weights   04/06/23 0500  04/07/23 0417 04/08/23 0435  Weight: 92.7 kg 88.1 kg 83.8 kg    Examination: Physical Exam HENT:     Nose:     Comments: NGT Eyes:     Pupils: Pupils are equal, round, and reactive to light.  Cardiovascular:     Rate and Rhythm: Normal rate and regular rhythm.     Pulses: Normal pulses.     Heart sounds: Normal heart sounds.  Pulmonary:     Breath sounds:  Rhonchi present.     Comments: Rhonchi BL lower lobes Abdominal:     General: Bowel sounds are normal.     Palpations: Abdomen is soft.  Musculoskeletal:     Right lower leg: Edema present.     Left lower leg: Edema present.     Comments: +1 pitting edema BLE  Skin:    General: Skin is warm.     Capillary Refill: Capillary refill takes less than 2 seconds.  Neurological:     Comments: Not following commands or withdrawing to noxious stimuli       Resolved Hospital Problem list     Assessment & Plan:  Acute hypoxic and hypercarbic respiratory failure secondary to BLL pneumonia, pleural effusions and acute exacerbation of COPD Leukocytosis Imaging concerning for Pleural effusions, pneumonia, aspiration pneumonia, in patient with history of tobacco abuse.  Remains without fevers overnight.  Chest x-ray 7/23 shows improved interstitial haziness in the lung bases and stable small pleural effusions.  - WBC 18.9 increased from 15.9, afebrile, continue to monitor and trend - MRSA PCR negative - Tracheal aspirate cultures show moderate Gram + cocci in clusters with , few yest with pseudohyphae, re-incubating for better growth -Cefepime IV antibiotic therapy for CAP coverage (day 3 of 7 day course) -Nebulizer therapy with Brovana, Pulmicort, Yupelri and albuterol as needed -Maintain ventilator support with daily SBT as tolerated -VAP bundle -PAD protocol with RASS goal -1 (patient maintains on Precedex, fentanyl, propofol)   Shock- Related to Sedation Medication vs  Pt remains on levophed gtt at , likely secondary to sedation- on prop, fentanyl, dex with RF. - Levophed PRN for MAP goal greater than 65 - Holding volume status at even today to maintain adequate CVP   AKI requiring CRRT Patient had septic shock related to ATN complicated by IV contrast with received on 7/11 now with progressive metabolic acidosis.  Patient failed diuretic challenge.  There is requiring  CRRT. -Continue CRRT with goal negative fluid balance (currently down 15lb weight since admission) -Renally dose medications -Ensure adequate renal perfusion -Strict intake and output -Frequent BMP monitoring   Normocytic anemia Thrombocytopenia H&H have remained stable over the last 2 days around 9.0 and currently increasing to 9.4 on latest CBC.  Platelets increased to 106K.  Thought to be secondary to sepsis.  Notably pt had bleeding around HD catheter 7/20 and given DDAVP with No further obvious bleeding noted on interview today. -Trend CBC-monitor for gross blood loss -Monitor and transfuse as needed for hemoglobin less than 7 -Continue subcutaneous heparin for DVT prophylaxis If bleeding resumes may consider dose of DDAVP prn   CAD Demand Ischemia Prolonged Qtc Troponin 134--162--101. -continue ASA and statin -Continue tele monitoring  Chronic ileus-improved Status post neostigmine on 7/18 - patient has had daily BMs now with rectal tube in place and adequate output per nursing staff. -Continue TF and advance to goal as tolerated -Reglan q8hrs - Continue Bowel regimen with Miralax and colace    Acute metabolic encephalopathy superimposed on anxiety  and depression- benzo dependent - Continue home Lexapro and Xanax (increased to home dose 1mg  QID 7/23) -Continue propofol, fentanyl, precedex from PAD protocol- with transition to precedex and only as needed fentanyl during SBT attempts   Hyperlipidemia - continue statin    Tobacco abuse -Continue to encourage cessation from tobacco abuse noted to mitigate associated factors   Hypophosphatemia -repleted and continue to monitor with AM labs   Goals of care: Notably patient had family meeting on 7/22.  Per IPAL-plan to wean sedation and SBT as tolerated with goal of extubation.  If patient unable to be extubated family state that it would be in patient's best wishes to have tracheostomy however would NOT like to be on  long-term ventilator support.  Best Practice (right click and "Reselect all SmartList Selections" daily)   Diet/type: tubefeeds DVT prophylaxis: prophylactic heparin  GI prophylaxis: PPI Lines: Central line, Dialysis Catheter, Arterial Line, and yes and it is still needed Foley:  Yes, and it is still needed Code Status:  full code Last date of multidisciplinary goals of care discussion [04/08/23 ]  Labs   CBC: Recent Labs  Lab 04/04/23 0500 04/05/23 0356 04/06/23 0336 04/07/23 0417 04/08/23 0506  WBC 16.2* 15.3* 16.5* 15.9* 18.9*  NEUTROABS  --   --  13.7* 13.1*  --   HGB 10.1* 9.3* 9.5* 8.9* 9.4*  HCT 32.1* 28.8* 29.1* 27.2* 29.8*  MCV 98.2 96.3 95.7 95.4 98.0  PLT 104* 90* 85* 86* 106*    Basic Metabolic Panel: Recent Labs  Lab 04/03/23 0253 04/04/23 0500 04/05/23 0356 04/05/23 1650 04/06/23 0336 04/06/23 2031 04/07/23 0417 04/08/23 0506  NA 140   < > 139  138 136 135 134* 136 134*  K 3.7   < > 3.9  3.9 3.8 3.8 3.6 3.8 3.8  CL 112*   < > 109  109 107 104 103 105 104  CO2 20*   < > 20*  19* 22 24 25 24 24   GLUCOSE 96   < > 104*  105* 136* 150* 138* 130* 127*  BUN 64*   < > 47*  47* 32* 24* 16 14 11   CREATININE 3.45*   < > 2.82*  2.98* 2.06* 1.65* 1.29* 1.21* 0.98  CALCIUM 8.1*   < > 7.8*  7.7* 7.6* 7.7* 7.5* 7.6* 7.6*  MG 1.9  --  2.2  --  2.4  --  2.4 2.3  PHOS 4.5   < > 2.8  2.9 1.9*  1.9* 1.5*  1.5* 1.8* 1.8* 2.1*   < > = values in this interval not displayed.   GFR: Estimated Creatinine Clearance: 63.6 mL/min (by C-G formula based on SCr of 0.98 mg/dL). Recent Labs  Lab 04/01/23 1602 04/02/23 0533 04/02/23 0845 04/03/23 0253 04/04/23 0500 04/04/23 1309 04/05/23 0356 04/06/23 0336 04/07/23 0417 04/08/23 0506  PROCALCITON 0.56 0.60  --  0.62  --  0.82  --   --   --   --   WBC 20.4*  --    < > 16.3*   < >  --  15.3* 16.5* 15.9* 18.9*   < > = values in this interval not displayed.    Liver Function Tests: Recent Labs  Lab  04/01/23 1602 04/03/23 0253 04/05/23 1650 04/06/23 0336 04/06/23 2031 04/07/23 0417 04/08/23 0506  AST 14*  --   --  15  --   --   --   ALT 17  --   --  15  --   --   --  ALKPHOS 46  --   --  49  --   --   --   BILITOT 0.8  --   --  0.7  --   --   --   PROT 5.1*  --   --  5.5*  --   --   --   ALBUMIN 2.2*   < > 2.3* 2.5*  2.4* 2.3* 2.4* 2.6*   < > = values in this interval not displayed.   No results for input(s): "LIPASE", "AMYLASE" in the last 168 hours. No results for input(s): "AMMONIA" in the last 168 hours.  ABG    Component Value Date/Time   PHART 7.26 (L) 04/01/2023 1630   PCO2ART 52 (H) 04/01/2023 1630   PO2ART 106 04/01/2023 1630   HCO3 23.3 04/01/2023 1630   ACIDBASEDEF 4.1 (H) 04/01/2023 1630   O2SAT 74.2 04/03/2023 1146     Coagulation Profile: No results for input(s): "INR", "PROTIME" in the last 168 hours.  Cardiac Enzymes: No results for input(s): "CKTOTAL", "CKMB", "CKMBINDEX", "TROPONINI" in the last 168 hours.  HbA1C: No results found for: "HGBA1C"  CBG: Recent Labs  Lab 04/07/23 1537 04/07/23 1936 04/07/23 2342 04/08/23 0411 04/08/23 0738  GLUCAP 117* 114* 121* 104* 118*    Review of Systems:   Negative except as listed in HPI and POC.  Past Medical History:  She,  has a past medical history of Anxiety, Asthma, Bladder polyps, COPD (chronic obstructive pulmonary disease) (HCC), Dysplasia of cervix, Hyperlipidemia, and Osteoarthritis.   Surgical History:   Past Surgical History:  Procedure Laterality Date   ABDOMINAL HYSTERECTOMY     CHOLECYSTECTOMY     TONSILLECTOMY Bilateral    TOTAL COLECTOMY  1998     Social History:   reports that she has been smoking cigarettes. She has a 40 pack-year smoking history. She has never used smokeless tobacco. She reports that she does not currently use alcohol. She reports that she does not currently use drugs.   Family History:  Her family history is not on file.   Allergies Allergies   Allergen Reactions   Ciprofloxacin Diarrhea    Per pt, this medication gave her c-diff     Home Medications  Prior to Admission medications   Medication Sig Start Date End Date Taking? Authorizing Provider  albuterol (ACCUNEB) 0.63 MG/3ML nebulizer solution Take 1 ampule by nebulization every 4 (four) hours as needed for wheezing or shortness of breath. 09/17/21  Yes [provider]  ALPRAZolam Prudy Feeler) 1 MG tablet Take 1 mg by mouth 4 (four) times daily. 03/15/21  Yes [provider]  ANORO ELLIPTA 62.5-25 MCG/INH AEPB Inhale 1 puff into the lungs daily. 03/13/21  Yes [provider]  diphenhydrAMINE HCl, Sleep, (ZZZQUIL PO) Take 2 tablets by mouth at bedtime. Pt takes 2 gummies every night   Yes [provider]  escitalopram (LEXAPRO) 10 MG tablet Take 10 mg by mouth 2 (two) times daily. 12/16/20  Yes [provider]  gabapentin (NEURONTIN) 400 MG capsule Take 800 mg by mouth 3 (three) times daily as needed. 03/13/21  Yes [provider]  ondansetron (ZOFRAN) 4 MG tablet Take 4 mg by mouth every 8 (eight) hours as needed for nausea or vomiting. 02/20/23  Yes [provider]  PROAIR HFA 108 (90 Base) MCG/ACT inhaler Inhale 2 puffs into the lungs 4 (four) times daily. 03/13/21  Yes [provider]     Critical care time: 35 minutes    Janeece Riggers,  AGACNP-BC [facility] For pager details, please see AMION or use EPIC chat After 1900, please call [facility] for cross coverage needs 04/08/2023 7:54 AM

## 2023-04-08 NOTE — Progress Notes (Signed)
Nutrition Follow-up  DOCUMENTATION CODES:   Not applicable  INTERVENTION:  - Continue advancing towards goal TF as below: Pivot 1.5 at 50 ml/h (1200 ml per day) Provides 1800 kcal, 112 gm protein, 900 ml free water daily  - Monitor magnesium, potassium, and phosphorus BID for at least 3 days, MD to replete as needed, as pt is at risk for refeeding syndrome.   - FWF per CCM/MD.    - Monitor weight trends.  NUTRITION DIAGNOSIS:   Inadequate oral intake related to inability to eat as evidenced by NPO status. *improving  GOAL:   Patient will meet greater than or equal to 90% of their needs *progressing  MONITOR:   Vent status, Labs, Weight trends  REASON FOR ASSESSMENT:   Consult Assessment of nutrition requirement/status  ASSESSMENT:   62 y.o. female with PMH COPD, chronic constipation, colectomy (in the 1990's) due to diverticulitis, fibromyalgia, depression who initially presented to [facility] for 1 week hx of URI symptoms admitted for acute hypoxic and hypercarbic respiratory failure 2/2 RLL PNA requiring intubation, septic shock, AKI and acute heart failure.    Shock and AKI improved and extubated 7/7 but required BiPAP.  Required reintubation 7/10 for respiratory failure.    Some concern for ileus with high OGT residuals with CT a/p showing slightly worse dilation of suspected chronic ileus with prior surgical changes. Possible emesis of TF since held, remains NPO on reglan.  Patient has failed SBT for several days. Transferred to [facility] ICU for further care.  7/5: admitted [facility] 7/7: extubated 7/10: reintubated  7/16: tx to [facility], remains intubated  7/18: OGT replaced with small bore NG feeding tube 7/19: IR attempted NGT advancement to post-pyloric but unsuccessful to tube left in stomach; Trickle feeds started; starting on CRRT 7/22: TF advanced to 42mL/hr  Patient receiving care from RN's at time of visit. RN reports tube feeds currently off for planned trach later this  afternoon.  Were running at 39mL/hr prior to being paused and RN reports patient was tolerating well. Plan to restart TF at 44mL/hr after trach.  Patient remains on CRRT. Failed SBT so trach planned this afternoon.  Admit weight: 202# Current weight: 184# *Patient -11.8L   Medications reviewed and include: Colace, Miralax, Reglan, Rena-vit, Insulin Precedex Fentanyl Levophed Propofol @ 8.73mL/hr (provides 220 kcals over 24 hours)  Labs reviewed:  Na 134 Phosphorus 2.1   Diet Order:   Diet Order             Diet NPO time specified  Diet effective now                   EDUCATION NEEDS:  Not appropriate for education at this time  Skin:  Skin Assessment: Reviewed RN Assessment Skin Integrity Issues:: Stage I Stage I: Left buttocks  Last BM:  7/16  Height:  Ht Readings from Last 1 Encounters:  04/01/23 5\' 5"  (1.651 m)   Weight:  Wt Readings from Last 1 Encounters:  04/08/23 83.8 kg   Ideal Body Weight:  56.82 kg  BMI:  Body mass index is 30.74 kg/m.  Estimated Nutritional Needs:  Kcal:  1800-2000 kcals Protein:  95-115 grams Fluid:  >/= 1.8L    Shelle Iron RD, LDN For contact information, refer to [facility].

## 2023-04-09 DIAGNOSIS — Z930 Tracheostomy status: Secondary | ICD-10-CM | POA: Diagnosis not present

## 2023-04-09 DIAGNOSIS — Z9911 Dependence on respirator [ventilator] status: Secondary | ICD-10-CM | POA: Diagnosis not present

## 2023-04-09 DIAGNOSIS — J441 Chronic obstructive pulmonary disease with (acute) exacerbation: Secondary | ICD-10-CM

## 2023-04-09 DIAGNOSIS — J9601 Acute respiratory failure with hypoxia: Secondary | ICD-10-CM | POA: Diagnosis not present

## 2023-04-09 LAB — RENAL FUNCTION PANEL
Albumin: 2.3 g/dL — ABNORMAL LOW (ref 3.5–5.0)
Anion gap: 12 (ref 5–15)
BUN: 22 mg/dL (ref 8–23)
CO2: 22 mmol/L (ref 22–32)
Calcium: 8 mg/dL — ABNORMAL LOW (ref 8.9–10.3)
Chloride: 100 mmol/L (ref 98–111)
Creatinine, Ser: 1.86 mg/dL — ABNORMAL HIGH (ref 0.44–1.00)
GFR, Estimated: 30 mL/min — ABNORMAL LOW (ref >60)
Glucose, Bld: 127 mg/dL — ABNORMAL HIGH (ref 70–99)
Phosphorus: 4.9 mg/dL — ABNORMAL HIGH (ref 2.5–4.6)
Potassium: 4.1 mmol/L (ref 3.5–5.1)
Sodium: 134 mmol/L — ABNORMAL LOW (ref 135–145)

## 2023-04-09 LAB — CBC
HCT: 26.2 % — ABNORMAL LOW (ref 36.0–46.0)
Hemoglobin: 8.3 g/dL — ABNORMAL LOW (ref 12.0–15.0)
MCH: 31.2 pg (ref 26.0–34.0)
MCHC: 31.7 g/dL (ref 30.0–36.0)
MCV: 98.5 fL (ref 80.0–100.0)
Platelets: 112 10*3/uL — ABNORMAL LOW (ref 150–400)
RBC: 2.66 MIL/uL — ABNORMAL LOW (ref 3.87–5.11)
RDW: 15.1 % (ref 11.5–15.5)
WBC: 22.3 10*3/uL — ABNORMAL HIGH (ref 4.0–10.5)
nRBC: 0 % (ref 0.0–0.2)

## 2023-04-09 LAB — GLUCOSE, CAPILLARY
Glucose-Capillary: 108 mg/dL — ABNORMAL HIGH (ref 70–99)
Glucose-Capillary: 113 mg/dL — ABNORMAL HIGH (ref 70–99)
Glucose-Capillary: 116 mg/dL — ABNORMAL HIGH (ref 70–99)
Glucose-Capillary: 134 mg/dL — ABNORMAL HIGH (ref 70–99)
Glucose-Capillary: 97 mg/dL (ref 70–99)
Glucose-Capillary: 97 mg/dL (ref 70–99)

## 2023-04-09 MED ORDER — METOCLOPRAMIDE HCL 5 MG/5ML PO SOLN
5.0000 mg | Freq: Three times a day (TID) | ORAL | Status: DC
  Administered 2023-04-09 – 2023-04-12 (×9): 5 mg
  Filled 2023-04-09: qty 10
  Filled 2023-04-09: qty 5
  Filled 2023-04-09 (×9): qty 10
  Filled 2023-04-09: qty 5
  Filled 2023-04-09: qty 10

## 2023-04-09 MED ORDER — SODIUM CHLORIDE 0.9 % IV SOLN: 2.0000 g | INTRAVENOUS | Status: DC

## 2023-04-09 MED ORDER — PIPERACILLIN-TAZOBACTAM 3.375 G IVPB
3.3750 g | Freq: Three times a day (TID) | INTRAVENOUS | Status: DC
  Administered 2023-04-09 – 2023-04-10 (×3): 3.375 g via INTRAVENOUS
  Filled 2023-04-09 (×3): qty 50

## 2023-04-09 NOTE — Progress Notes (Addendum)
NAME:  Tamara Castro, MRN:  [id number], DOB:  17-Aug-1961, LOS: 8 ADMISSION DATE:  04/01/2023, CONSULTATION DATE:  04/01/2023 REFERRING MD:  Salvadore Dom , CHIEF COMPLAINT:  Respiratory Failure   History of Present Illness:  HPI obtained from EMR review as patient is intubated and sedated on MV.    61 yoF with PMH as below who presented to [facility] on 7/5 after 1 week hx of URI symptoms admitted for acute hypoxic and hypercarbic respiratory failure 2/2 RLL PNA requiring intubation, septic shock, AKI and acute heart failure.  Found to have pBNP of 49k with elevated troponin's thought secondary to demand ischemia with echo with normal EF, reduced RV, indeterminate diastolic function.  CTA neg for PE 7/5.  CTH neg 7/9.  Covered with azithromycin/ ceftriaxone, switched to cefepime on 7/7.  Shock and AKI improved and extubated 7/7 but required BiPAP.  Required reintubation 7/10 for respiratory failure.  Increasing WBC 7/13, placed on doxycyline switched to vancomycin with elevated PCT with CXR showing increased bibasilar atelectasis vs infiltrates with increasing pleural effusions.  Initial blood culture x1 w/ stap epi, thought contaminate with repeat BC 7/7 negative.  Some concern for ileus with high OGT residuals with CT a/p showing slightly worse dilation of suspected chronic ileus with prior surgical changes but no transition point, possible emesis of TF since held, remains, NPO on reglan.  Patient has failed SBT for several days, due to apnea despite decreased sedation.  Transferred to [facility] ICU for further care, admitted to PCCM.  Pertinent  Medical History  Tobacco abuse,  COPD, chronic constipation, prior colectomy in 1990's 2/2 diverticulitis, fibromyalgia (on disability since 2008), depression, anxiety, neuropathy   Home meds> xanax (1mg  QID), lexapro, gabapentin, anoro elliptia, zofran  Significant Hospital Events: Including procedures, antibiotic start and stop dates in addition to other pertinent  events   7/5 admitted [facility] CTX and azith started 7/7 extubated. Vanc added and cefepime started; CTX stopped 7/8 vanc stopped 7/10 reintubated  7/16 tx to [facility]. Cefepime stopped 7/17 attempting lasix. Creatinine worse.  Echo EF 60-65%no WMA RV nml  7/18 cr worse after lasix. Weaning but on-going ileus and cr rising presenting obstacle to extubation Trying neostigmine.  7/19 scr worse in spite of holding lasix. Still on low dose precedex. Mental status and renal fxn still barrier to extubation. Nephro consulted > started CRRT.  HD catheter placed. 7/20 [place] heparin stopped d/t bleeding around HD cath. Got DDAVP.  7/21 resumes cefepime for worsening leukocytosis.  7/22 volumes ok w/ SBT but has sig accessory use  7/23- Trach performed at bedside. CRRT holiday per nephrology. Increased Xanax to home dose 1mg  QID- added seroquel 25mg  BID   Interim History / Subjective:  Pt remains on sedation not following commands, will wean sedation and trial SBT today.  Objective   Blood pressure 131/63, pulse 85, temperature 97.7 F (36.5 C), temperature source Oral, resp. rate 20, height 5\' 5"  (1.651 m), weight 83.8 kg, SpO2 98%. CVP:  [1 mmHg-13 mmHg] 11 mmHg  Vent Mode: PSV FiO2 (%):  [30 %] 30 % Set Rate:  [20 bmp] 20 bmp Vt Set:  [450 mL] 450 mL PEEP:  [5 cmH20] 5 cmH20 Pressure Support:  [8 cmH20] 8 cmH20 Plateau Pressure:  [13 cmH20-20 cmH20] 20 cmH20   Intake/Output Summary (Last 24 hours) at 04/09/2023 0841 Last data filed at 04/09/2023 0831 Gross per 24 hour  Intake 3615.8 ml  Output 1112 ml  Net 2503.8 ml   [employer]  04/07/23 0417 04/08/23 0435 04/09/23 0500  Weight: 88.1 kg 83.8 kg 83.8 kg    Examination: Physical Exam HENT:     Nose:     Comments: NGT Eyes:     Pupils: Pupils are equal, round, and reactive to light.  Cardiovascular:     Rate and Rhythm: Normal rate and regular rhythm.     Pulses: Normal pulses.     Heart sounds: Normal heart sounds.  Pulmonary:      Breath sounds: Rhonchi present.     Comments: Rhonchi BL lower lobes Abdominal:     General: Bowel sounds are normal.     Palpations: Abdomen is soft.  Musculoskeletal:     Right lower leg: Edema present.     Left lower leg: Edema present.     Comments: +1 pitting edema BLE  Skin:    General: Skin is warm.     Capillary Refill: Capillary refill takes less than 2 seconds.  Neurological:     Comments: Not following commands or withdrawing to noxious stimuli       Resolved Hospital Problem list   CAD Demand Ischemia Prolonged Qtc Troponin 134--162--101. -continue ASA and statin and continue tele monitoring  Assessment & Plan:   Acute hypoxic and hypercarbic respiratory failure secondary to bilateral lower lobe pneumonia, pleural effusions, acute exacerbation of COPD Leukocytosis Imaging concerning for pleural effusions, pneumonia, aspiration pneumonia in a patient with a history of tobacco abuse.   - Leukocytosis overnight 22 however could be stress related in setting of tracheostomy performed at bedside 7/23, remains afebrile. -MRSA PCR negative -Tracheal aspirate cultures (7/21) showed normal respiratory flora -Day 4 of 7 IV abx coverage for PNA coverage- downgraded from Cefepime to Zosyn 7/24 (pharmacy to dose) -Nebulizer therapy with Brovana, Pulmicort, Yupelri and albuterol PRN -VAP bundle (head of bed 30 degrees, frequent oral and tracheostomy care) -PAD protocol with RASS goal -1 (patient maintains on Precedex, fentanyl, propofol) -Daily SBT as tolerated.  Patient will benefit from [facility] for prolonged vent weaning   Shock-related to sedation medication versus above Levophed infusion has decreased in setting of decreased sedation.  Hopeful that as we continue to titrate down on sedation Levophed drip will be able to be turned off. -Levophed as needed for MAP goal greater than 65 or SBP greater than 90   AKI requiring CRRT Patient in septic shock related to ATN  complicated by IV contrast 7/11.  Failed diuretic challenge and has required CRRT.  (Currently -15lb since admission). -Holding CRRT per nephrology to monitor recovery in renal function -Serum creatinine increased to 1.86 this morning from 0.98 day prior -Avoid nephrotoxic agents -Renally dose medications -Strict intake and output -Frequent BMP monitoring   Normocytic anemia Thrombocytopenia Notably pt had bleeding from HD cath site 7/20 and was given DDAVP. No further bleeding noted on interview over last 24 hours. Thrombocytopenia likely secondary to sepsis, however improving. -H/H stable at 8.3/26.2 -platelets 112K increased from prior 106K, continue to monitor with AM CBC    Chronic ileus- improved S/p neostigmine 7/18- pt has had daily BM's with rectal tube in place.  -Remove rectal tube when appropriate -Reglan q8hrs -Bowel regimen with miralax and colace   Acute metabolic encephalopathy superimposed on anxiety and depression- benzo dependent -continue home lexapro and xanax -on propofol, fentanyl, precedex via PAD protocol. Goal transition to precedex and only as needed fentanyl during SBT attempts   Hyperlipidemia -continue statin   Tobacco abuse -continue to encourage cessation from tobacco abuse in  order to mitigate associated risk factors   Hyperphosphatemia Overcorrected from 2.3 on 7/23.  -monitor with AM labs   Hypervolemic hyponatremia Serum sodium stable at 134. On CRRT holiday -monitor with AM labs, if continues to worsen would consider restarting CRRT and diuresis for fluid management   Goals of Care: Pt trached 7/23 and consented- discussed with family at bedside. Family state that pt would NOT want to remain on long term ventilator support- however are in agreement with LTAC for progressive ventilator weaning as tolerated    Best Practice (right click and "Reselect all SmartList Selections" daily)   Diet/type: tubefeeds DVT prophylaxis:  prophylactic heparin  GI prophylaxis: PPI Lines: Central line, Dialysis Catheter, Arterial Line, and yes and it is still needed Foley:  Yes, and it is still needed Code Status:  full code Last date of multidisciplinary goals of care discussion [04/09/23 ]  Labs   CBC: Recent Labs  Lab 04/05/23 0356 04/06/23 0336 04/07/23 0417 04/08/23 0506 04/09/23 0451  WBC 15.3* 16.5* 15.9* 18.9* 22.3*  NEUTROABS  --  13.7* 13.1*  --   --   HGB 9.3* 9.5* 8.9* 9.4* 8.3*  HCT 28.8* 29.1* 27.2* 29.8* 26.2*  MCV 96.3 95.7 95.4 98.0 98.5  PLT 90* 85* 86* 106* 112*    Basic Metabolic Panel: Recent Labs  Lab 04/03/23 0253 04/04/23 0500 04/05/23 0356 04/05/23 1650 04/06/23 0336 04/06/23 2031 04/07/23 0417 04/08/23 0506 04/09/23 0451  NA 140   < > 139  138   < > 135 134* 136 134* 134*  K 3.7   < > 3.9  3.9   < > 3.8 3.6 3.8 3.8 4.1  CL 112*   < > 109  109   < > 104 103 105 104 100  CO2 20*   < > 20*  19*   < > 24 25 24 24 22   GLUCOSE 96   < > 104*  105*   < > 150* 138* 130* 127* 127*  BUN 64*   < > 47*  47*   < > 24* 16 14 11 22   CREATININE 3.45*   < > 2.82*  2.98*   < > 1.65* 1.29* 1.21* 0.98 1.86*  CALCIUM 8.1*   < > 7.8*  7.7*   < > 7.7* 7.5* 7.6* 7.6* 8.0*  MG 1.9  --  2.2  --  2.4  --  2.4 2.3  --   PHOS 4.5   < > 2.8  2.9   < > 1.5*  1.5* 1.8* 1.8* 2.1* 4.9*   < > = values in this interval not displayed.   GFR: Estimated Creatinine Clearance: 33.5 mL/min (A) (by C-G formula based on SCr of 1.86 mg/dL (H)). Recent Labs  Lab 04/03/23 0253 04/04/23 0500 04/04/23 1309 04/05/23 0356 04/06/23 0336 04/07/23 0417 04/08/23 0506 04/09/23 0451  PROCALCITON 0.62  --  0.82  --   --   --   --   --   WBC 16.3*   < >  --    < > 16.5* 15.9* 18.9* 22.3*   < > = values in this interval not displayed.    Liver Function Tests: Recent Labs  Lab 04/06/23 0336 04/06/23 2031 04/07/23 0417 04/08/23 0506 04/09/23 0451  AST 15  --   --   --   --   ALT 15  --   --   --   --    ALKPHOS 49  --   --   --   --  BILITOT 0.7  --   --   --   --   PROT 5.5*  --   --   --   --   ALBUMIN 2.5*  2.4* 2.3* 2.4* 2.6* 2.3*   No results for input(s): "LIPASE", "AMYLASE" in the last 168 hours. No results for input(s): "AMMONIA" in the last 168 hours.  ABG    Component Value Date/Time   PHART 7.26 (L) 04/01/2023 1630   PCO2ART 52 (H) 04/01/2023 1630   PO2ART 106 04/01/2023 1630   HCO3 23.3 04/01/2023 1630   ACIDBASEDEF 4.1 (H) 04/01/2023 1630   O2SAT 74.2 04/03/2023 1146     Coagulation Profile: No results for input(s): "INR", "PROTIME" in the last 168 hours.  Cardiac Enzymes: No results for input(s): "CKTOTAL", "CKMB", "CKMBINDEX", "TROPONINI" in the last 168 hours.  HbA1C: No results found for: "HGBA1C"  CBG: Recent Labs  Lab 04/08/23 1600 04/08/23 2005 04/08/23 2357 04/09/23 0333 04/09/23 0821  GLUCAP 129* 107* 113* 108* 97    Review of Systems:   Negative except as listed in HPI and POC.  Past Medical History:  She,  has a past medical history of Anxiety, Asthma, Bladder polyps, COPD (chronic obstructive pulmonary disease) (HCC), Dysplasia of cervix, Hyperlipidemia, and Osteoarthritis.   Surgical History:   Past Surgical History:  Procedure Laterality Date   ABDOMINAL HYSTERECTOMY     CHOLECYSTECTOMY     TONSILLECTOMY Bilateral    TOTAL COLECTOMY  1998     Social History:   reports that she has been smoking cigarettes. She has a 40 pack-year smoking history. She has never used smokeless tobacco. She reports that she does not currently use alcohol. She reports that she does not currently use drugs.   Family History:  Her family history is not on file.   Allergies Allergies  Allergen Reactions   Ciprofloxacin Diarrhea    Per pt, this medication gave her c-diff     Home Medications  Prior to Admission medications   Medication Sig Start Date End Date Taking? Authorizing Provider  albuterol (ACCUNEB) 0.63 MG/3ML nebulizer solution  Take 1 ampule by nebulization every 4 (four) hours as needed for wheezing or shortness of breath. 09/17/21  Yes [provider]  ALPRAZolam Prudy Feeler) 1 MG tablet Take 1 mg by mouth 4 (four) times daily. 03/15/21  Yes [provider]  ANORO ELLIPTA 62.5-25 MCG/INH AEPB Inhale 1 puff into the lungs daily. 03/13/21  Yes [provider]  diphenhydrAMINE HCl, Sleep, (ZZZQUIL PO) Take 2 tablets by mouth at bedtime. Pt takes 2 gummies every night   Yes [provider]  escitalopram (LEXAPRO) 10 MG tablet Take 10 mg by mouth 2 (two) times daily. 12/16/20  Yes [provider]  gabapentin (NEURONTIN) 400 MG capsule Take 800 mg by mouth 3 (three) times daily as needed. 03/13/21  Yes [provider]  ondansetron (ZOFRAN) 4 MG tablet Take 4 mg by mouth every 8 (eight) hours as needed for nausea or vomiting. 02/20/23  Yes [provider]  PROAIR HFA 108 (90 Base) MCG/ACT inhaler Inhale 2 puffs into the lungs 4 (four) times daily. 03/13/21  Yes [provider]     Critical care time: 35 minutes    Janeece Riggers, AGACNP-BC [facility] For pager details, please see AMION or use EPIC chat After 1900, please call ELINK for cross coverage needs 04/09/2023 8:41 AM

## 2023-04-09 NOTE — Plan of Care (Signed)
  Problem: Clinical Measurements: Goal: Respiratory complications will improve Outcome: Progressing   Problem: Respiratory: Goal: Patent airway maintenance will improve Outcome: Progressing   Problem: Health Behavior/Discharge Planning: Goal: Ability to manage health-related needs will improve Outcome: Not Progressing   Problem: Skin Integrity: Goal: Risk for impaired skin integrity will decrease Outcome: Not Progressing   Problem: Role Relationship: Goal: Method of communication will improve Outcome: Not Progressing   Problem: Health Behavior/Discharge Planning: Goal: Ability to manage tracheostomy will improve Outcome: Not Progressing

## 2023-04-09 NOTE — Progress Notes (Signed)
[facility] Progress Note  Subjective: taken off CRRT yesterday. Minimal UOP overnight and today. Creat 1.8 and BUN 22.   Vitals:   04/09/23 1530 04/09/23 1545 04/09/23 1600 04/09/23 1615  BP:   (!) 97/52   Pulse: 81 99 97 (!) 101  Resp: 18 (!) 21 19 (!) 25  Temp:      TempSrc:      SpO2: 96% 95% 95% 95%  Weight:      Height:        Exam: General: Critically ill looking female, intubated, sedated Heart:RRR, s1s2 nl Lungs: Coarse breath sound bilateral Abdomen:soft, non-distended Extremities: 1-2+ bilat UE/ LE edema Dialysis Access: Right IJ temp cath in place      Date   Creat  eGFR     Aug 2022  1.18 Sep 2021  1.04     03/21/23  1.63      7/06   1.72     7/07   1.44       7/08   1.13     7/09   1.35       7/10   1.17     7/11   1.04     7/12   1.00        7/13   1.06     7/14   1.38     03/31/23  1.48       7/16   2.26     7/18   3.45     7/19   3.84  CRRT started     7/20   2.82     04/06/23  1.65     04/07/23  1.21    UA 7/16 - mod Hb, ket 5, 100 prot, rare bact, 11-20rbc, 21-50 wbc, 0-5 epi    Assessment/ Plan: Acute kidney injury - b/l creat 1.0 from jan 2023. Creat here was 1.6 on admission in setting of pneumonia, hypotension and septic shock. Creat peaked at 1.7 and then returned to 1.0 on 7/12. Renal failure worsened again on 7/15 up to 1.48 and then up to 3.84 on on 7/19. CRRT started 7/19. AKI felt to be related to hypotension and IV contrast + some diuretics given. Pt remains oliguric. CRRT holiday started 7/23 to see if there is any improvement in renal function.  Acute hypoxic respiratory failure - due to COPD/ pna, on vent per CCM. F/u CXR 7/23 negative for edema.   Sepsis - initial dx on admission w/ pna and septic shock. Remains in shock on levo gtt at lower doses today Volume ^/lower extremity edema - edema is better overall but still 1-2+ Anemia - Hb 8- 10 range, tranfuse prn.  Metabolic acidosis - Improved with CRRT.   Hypophosphatemia - received repletion, follow COPD Anxiety/ depression   Vinson Moselle MD  [facility] 04/09/2023, 12:32 PM  Recent Labs  Lab 04/08/23 0506 04/09/23 0451  HGB 9.4* 8.3*  ALBUMIN 2.6* 2.3*  CALCIUM 7.6* 8.0*  PHOS 2.1* 4.9*  CREATININE 0.98 1.86*  K 3.8 4.1   No results for input(s): "IRON", "TIBC", "FERRITIN" in the last 168 hours. Inpatient medications:  ALPRAZolam  1 mg Per Tube QID   arformoterol  15 mcg Nebulization BID   aspirin  81 mg Per Tube Daily   atorvastatin  20 mg Per Tube Daily   budesonide (PULMICORT) nebulizer solution  0.5 mg Nebulization BID   Chlorhexidine Gluconate Cloth  6 each Topical Daily   docusate  100 mg Per Tube BID   escitalopram  10 mg Per Tube Daily   feeding supplement (PIVOT 1.5 CAL)  1,000 mL Per Tube Q24H   Gerhardt's butt cream   Topical BID   heparin injection (subcutaneous)  5,000 Units Subcutaneous Q8H   insulin aspart  0-6 Units Subcutaneous Q4H   metoCLOPramide  5 mg Per Tube Q8H   multivitamin  1 tablet Per Tube QHS   mouth rinse  15 mL Mouth Rinse Q2H   pantoprazole (PROTONIX) IV  40 mg Intravenous Q12H   polyethylene glycol  17 g Per NG tube Daily   QUEtiapine  25 mg Oral BID   revefenacin  175 mcg Nebulization Daily   sodium chloride flush  10-40 mL Intracatheter Q12H    sodium chloride 5 mL/hr at 04/09/23 1638   dexmedetomidine (PRECEDEX) IV infusion 0.9 mcg/kg/hr (04/09/23 1638)   fentaNYL infusion INTRAVENOUS 50 mcg/hr (04/09/23 1638)   norepinephrine (LEVOPHED) Adult infusion 2 mcg/min (04/09/23 1638)   piperacillin-tazobactam (ZOSYN)  IV     propofol (DIPRIVAN) infusion Stopped (04/09/23 1242)   acetaminophen (TYLENOL) oral liquid 160 mg/5 mL, albuterol, bisacodyl, fentaNYL, fentaNYL (SUBLIMAZE) injection, fentaNYL (SUBLIMAZE) injection, lip balm, midazolam, mouth rinse, phenol, pneumococcal 20-valent conjugate vaccine, polyvinyl alcohol, sodium chloride flush

## 2023-04-09 NOTE — Progress Notes (Addendum)
PT Cancellation Note  Patient Details Name: Tamara Castro MRN: [id number] DOB: 1960/12/26   Cancelled Treatment:    Reason Eval/Treat Not Completed: Medical issues which prohibited therapy;Fatigue/lethargy limiting ability to participate  Noted  trial of weaning and lightening of sedation. Will check back tomorrow.RN reports patient minimally arouses with stimulation. Blanchard Kelch PT Acute Rehabilitation Services Office [phone] Weekend [phone]    Rada Hay 04/09/2023, 11:53 AM  SLP deferred eval also due to AMS.  Rolena Infante, MS [facility] SLP Acute [employer] [phone]

## 2023-04-09 NOTE — Plan of Care (Signed)
Acute Hypoxic and hypercarbic respiratory failure secondary to bilateral lower lobe pneumonia, pleural effusions, and acute COPD exacerbation -Has tolerated SBT for at least 4 hours  -trial trach collar trial    Shock-related to sedation medication versus above -maintains on levophed, titrating down propofol. Hopeful that this will allow pt to come off levophed completely

## 2023-04-09 NOTE — Progress Notes (Addendum)
PHARMACY NOTE -  Zosyn  Pharmacy has been assisting with dosing of Cefepime for pneumonia; now transitioning to [facility] dosing per CCM and standing antibiotic adjustment protocols Currently on CRRT holiday since yesterday. SCr jumped from < 1 on CRRT to 1.8 overnight Last Cefepime dose today at 0700 Will start Zosyn tonight (~12 hr after last cefepime dose): 3.375 g IV q8 hr Will continue to follow peripherally; adjusting Zosyn as needed if CrCl worsens or CRRT resumes  Pharmacy will sign off, following peripherally for culture results, dose adjustments, and length of therapy. Please reconsult if a change in clinical status warrants re-evaluation of dosage.  Bernadene Person, PharmD, BCPS [phone] 04/09/2023, 11:05 AM

## 2023-04-10 DIAGNOSIS — J9601 Acute respiratory failure with hypoxia: Secondary | ICD-10-CM | POA: Diagnosis not present

## 2023-04-10 DIAGNOSIS — R34 Anuria and oliguria: Secondary | ICD-10-CM | POA: Diagnosis not present

## 2023-04-10 DIAGNOSIS — Z930 Tracheostomy status: Secondary | ICD-10-CM | POA: Diagnosis not present

## 2023-04-10 DIAGNOSIS — N179 Acute kidney failure, unspecified: Secondary | ICD-10-CM | POA: Diagnosis not present

## 2023-04-10 LAB — GLUCOSE, CAPILLARY
Glucose-Capillary: 115 mg/dL — ABNORMAL HIGH (ref 70–99)
Glucose-Capillary: 118 mg/dL — ABNORMAL HIGH (ref 70–99)
Glucose-Capillary: 121 mg/dL — ABNORMAL HIGH (ref 70–99)
Glucose-Capillary: 130 mg/dL — ABNORMAL HIGH (ref 70–99)
Glucose-Capillary: 131 mg/dL — ABNORMAL HIGH (ref 70–99)
Glucose-Capillary: 132 mg/dL — ABNORMAL HIGH (ref 70–99)
Glucose-Capillary: 140 mg/dL — ABNORMAL HIGH (ref 70–99)

## 2023-04-10 LAB — RENAL FUNCTION PANEL
Albumin: 2 g/dL — ABNORMAL LOW (ref 3.5–5.0)
Anion gap: 9 (ref 5–15)
BUN: 38 mg/dL — ABNORMAL HIGH (ref 8–23)
CO2: 22 mmol/L (ref 22–32)
Calcium: 7.7 mg/dL — ABNORMAL LOW (ref 8.9–10.3)
Chloride: 103 mmol/L (ref 98–111)
Creatinine, Ser: 2.98 mg/dL — ABNORMAL HIGH (ref 0.44–1.00)
GFR, Estimated: 17 mL/min — ABNORMAL LOW (ref >60)
Glucose, Bld: 121 mg/dL — ABNORMAL HIGH (ref 70–99)
Phosphorus: 4.5 mg/dL (ref 2.5–4.6)
Potassium: 4.4 mmol/L (ref 3.5–5.1)
Sodium: 134 mmol/L — ABNORMAL LOW (ref 135–145)

## 2023-04-10 LAB — CBC
HCT: 23 % — ABNORMAL LOW (ref 36.0–46.0)
HCT: 24.7 % — ABNORMAL LOW (ref 36.0–46.0)
Hemoglobin: 7.3 g/dL — ABNORMAL LOW (ref 12.0–15.0)
Hemoglobin: 7.8 g/dL — ABNORMAL LOW (ref 12.0–15.0)
MCH: 31.2 pg (ref 26.0–34.0)
MCH: 31.5 pg (ref 26.0–34.0)
MCHC: 31.7 g/dL (ref 30.0–36.0)
MCHC: 31.6 g/dL (ref 30.0–36.0)
MCV: 99.6 fL (ref 80.0–100.0)
Platelets: 109 10*3/uL — ABNORMAL LOW (ref 150–400)
Platelets: 122 10*3/uL — ABNORMAL LOW (ref 150–400)
RBC: 2.34 MIL/uL — ABNORMAL LOW (ref 3.87–5.11)
RBC: 2.48 MIL/uL — ABNORMAL LOW (ref 3.87–5.11)
RDW: 15.7 % — ABNORMAL HIGH (ref 11.5–15.5)
RDW: 16 % — ABNORMAL HIGH (ref 11.5–15.5)
WBC: 20 10*3/uL — ABNORMAL HIGH (ref 4.0–10.5)
WBC: 19.5 10*3/uL — ABNORMAL HIGH (ref 4.0–10.5)
nRBC: 0 % (ref 0.0–0.2)
nRBC: 0 % (ref 0.0–0.2)

## 2023-04-10 LAB — TRIGLYCERIDES: Triglycerides: 61 mg/dL (ref <150)

## 2023-04-10 LAB — TYPE AND SCREEN
ABO/RH(D): A POS
Antibody Screen: NEGATIVE

## 2023-04-10 MED ORDER — OXYCODONE HCL 5 MG PO TABS
5.0000 mg | ORAL_TABLET | Freq: Four times a day (QID) | ORAL | Status: DC | PRN
  Administered 2023-04-15 – 2023-04-18 (×3): 5 mg
  Filled 2023-04-10 (×3): qty 1

## 2023-04-10 MED ORDER — SODIUM BICARBONATE 650 MG PO TABS: 650.0000 mg | ORAL_TABLET | Freq: Once | ORAL | Status: DC

## 2023-04-10 MED ORDER — SODIUM CHLORIDE 0.9 % IV SOLN
8.0000 mg | Freq: Four times a day (QID) | INTRAVENOUS | Status: DC | PRN
  Administered 2023-04-10 – 2023-04-12 (×3): 8 mg via INTRAVENOUS
  Filled 2023-04-10 (×3): qty 4

## 2023-04-10 MED ORDER — PIPERACILLIN-TAZOBACTAM 3.375 G IVPB
3.3750 g | Freq: Two times a day (BID) | INTRAVENOUS | Status: DC
  Administered 2023-04-10 – 2023-04-11 (×2): 3.375 g via INTRAVENOUS
  Filled 2023-04-10 (×2): qty 50

## 2023-04-10 MED ORDER — PANCRELIPASE (LIP-PROT-AMYL) 10440-39150 UNITS PO TABS
20880.0000 [IU] | ORAL_TABLET | Freq: Once | ORAL | Status: DC
  Filled 2023-04-10: qty 2

## 2023-04-10 MED ORDER — DEXMEDETOMIDINE HCL IN NACL 400 MCG/100ML IV SOLN
0.0000 ug/kg/h | INTRAVENOUS | Status: AC
  Administered 2023-04-10: 0.4 ug/kg/h via INTRAVENOUS
  Administered 2023-04-11: 0.7 ug/kg/h via INTRAVENOUS
  Filled 2023-04-10 (×2): qty 100

## 2023-04-10 MED ORDER — HYDRALAZINE HCL 20 MG/ML IJ SOLN
10.0000 mg | INTRAMUSCULAR | Status: DC | PRN
  Administered 2023-04-10 – 2023-04-19 (×8): 10 mg via INTRAVENOUS
  Filled 2023-04-10 (×10): qty 1

## 2023-04-10 NOTE — Progress Notes (Addendum)
[facility] Progress Note  Subjective: Minimal UOP overnight. Bun/ creat up to 38/2.9.   Vitals:   04/10/23 1000 04/10/23 1124 04/10/23 1200 04/10/23 1400  BP:   (!) 158/72   Pulse: 71  (!) 108 (!) 109  Resp: 13  (!) 22 (!) 27  Temp:   98.6 F (37 C)   TempSrc:   Oral   SpO2: 97% 97% 96% 95%  Weight:      Height:        Exam: General: ill-looking adult WF, extubated, trach in place, on vent Heart:RRR, s1s2 nl Lungs: Coarse breath sound bilateral Abdomen:soft, non-distended Extremities: 1-2+ bilat UE/ LE edema Dialysis Access: Right IJ temp cath in place     UA 7/16 - mod Hb, ket 5, 100 prot, rare bact, 11-20rbc, 21-50 wbc, 0-5 epi   CT abd 7/17 - normal appearing kidneys w/o hydronephrosis    Assessment/ Plan: Acute kidney injury - b/l creat 1.0 from jan 2023. Creat here was 1.6 on admission in setting of pneumonia, hypotension and septic shock. Creat peaked at 1.7 and then returned to 1.0 on 7/12. Renal failure worsened again on 7/15 up to 1.48 and then up to 3.84 on on 7/19. CRRT started 7/19. AKI felt to be related to hypotension and IV contrast + some diuretics given. Pt remains oliguric. CRRT holiday started 7/23 but no signs of renal recovery. Recommend transfer to [facility] for [facility]. Have d/w pmd.  Acute hypoxic respiratory failure - due to COPD/ pna, on vent w/ trach per CCM. F/u CXR 7/23 was negative for edema.   Sepsis - initial dx on admission w/ pna and septic shock. Is off pressors as of the last 24- 48hrs.  Volume ^/lower extremity edema - edema is better overall but still 1-2+ Anemia - Hb 8- 10 range, tranfuse prn COPD Anxiety/ depression   Vinson Moselle MD  [facility] 04/10/2023, 12:32 PM  Recent Labs  Lab 04/09/23 0451 04/10/23 0503  HGB 8.3* 7.3*  ALBUMIN 2.3* 2.0*  CALCIUM 8.0* 7.7*  PHOS 4.9* 4.5  CREATININE 1.86* 2.98*  K 4.1 4.4   No results for input(s): "IRON", "TIBC", "FERRITIN" in the last 168 hours. Inpatient medications:  ALPRAZolam   1 mg Per Tube QID   arformoterol  15 mcg Nebulization BID   aspirin  81 mg Per Tube Daily   atorvastatin  20 mg Per Tube Daily   budesonide (PULMICORT) nebulizer solution  0.5 mg Nebulization BID   Chlorhexidine Gluconate Cloth  6 each Topical Daily   docusate  100 mg Per Tube BID   escitalopram  10 mg Per Tube Daily   feeding supplement (PIVOT 1.5 CAL)  1,000 mL Per Tube Q24H   Gerhardt's butt cream   Topical BID   heparin injection (subcutaneous)  5,000 Units Subcutaneous Q8H   insulin aspart  0-6 Units Subcutaneous Q4H   metoCLOPramide  5 mg Per Tube Q8H   multivitamin  1 tablet Per Tube QHS   mouth rinse  15 mL Mouth Rinse Q2H   pantoprazole (PROTONIX) IV  40 mg Intravenous Q12H   polyethylene glycol  17 g Per NG tube Daily   QUEtiapine  25 mg Oral BID   revefenacin  175 mcg Nebulization Daily   sodium chloride flush  10-40 mL Intracatheter Q12H    sodium chloride Stopped (04/10/23 0629)   piperacillin-tazobactam (ZOSYN)  IV     acetaminophen (TYLENOL) oral liquid 160 mg/5 mL, albuterol, bisacodyl, fentaNYL (SUBLIMAZE) injection, fentaNYL (SUBLIMAZE) injection,  lip balm, midazolam, mouth rinse, phenol, pneumococcal 20-valent conjugate vaccine, polyvinyl alcohol, sodium chloride flush

## 2023-04-10 NOTE — Progress Notes (Addendum)
PHARMACY NOTE:  ANTIMICROBIAL RENAL DOSAGE ADJUSTMENT  Current antimicrobial regimen includes a mismatch between antimicrobial dosage and estimated renal function. As per policy approved by the Pharmacy & Therapeutics and Medical Executive Committees, the antimicrobial dosage will be adjusted accordingly.  Current antimicrobial and dosage:  Zosyn 3.375 g q8 hr  Indication: pneumonia  Renal Function:   Estimated Creatinine Clearance: 20.7 mL/min (A) (by C-G formula based on SCr of 2.98 mg/dL (H)). []      On intermittent HD, scheduled: []      On CRRT    Antimicrobial dosage has been changed to:  3.375 g IV q12 hr by extended infusion   Additional Comments: CrCl remains just above 20 ml/min currently, but with patient having stopped CRRT, and subsequent rapid increase in SCr, expect CrCl will be < 20 ml/min by tomorrow   Thank you for allowing pharmacy to be a part of this patient's care.  Bernadene Person, PharmD, BCPS [phone] 04/10/2023, 2:24 PM

## 2023-04-10 NOTE — Progress Notes (Signed)
Pt admitted to MICU 1820 from [facility] via [facility]link. Tube feeds stopped prior to transfer and pt did not arrive with tube feeds running. 1800 Scheduled xanax not given due to pt vomiting.  Pt vomited a large amount of tube feeding after being laid flat for transfer from gurney to MICU bed. No tube feedings noted in trach suctioning, only oral suctioning.  Full nursing assessment completed.  Pt hypertensive and tachycardic, but not signs of respiratory distress.  of fentanyl given to maintain RASS and CPOT.

## 2023-04-10 NOTE — Progress Notes (Signed)
Report given to Reuel Boom RN at [facility]. Report given to carelink. Pt stable at this time.

## 2023-04-10 NOTE — Progress Notes (Signed)
OT Cancellation Note  Patient Details Name: Tamara Castro MRN: [id number] DOB: 1961/06/29   Cancelled Treatment:    Reason Eval/Treat Not Completed: Medical issues which prohibited therapy: Hold therapy today per RN. Pt remains on Vent and sedated. Will continue to monitor.   Theodoro Clock 04/10/2023, 11:38 AM

## 2023-04-10 NOTE — Progress Notes (Signed)
Failed SBT due to accessory muscle use, NM weakness.  Steffanie Dunn, DO 04/10/23 12:59 PM [facility]

## 2023-04-10 NOTE — Progress Notes (Addendum)
eLink Physician-Brief Progress Note Patient Name: Tamara Castro DOB: Sep 09, 1961 MRN: [id number]   Date of Service  04/10/2023  HPI/Events of Note  62 year old female had prolonged intubation and transition to tracheostomy in the setting of renal failure and intermittent bleeding.  Notified that the patient is hypertensive into the 180s.  She had a bout of Emesis  eICU Interventions  Tube feeds were held, Zofran ordered  As needed antihypertensives added.   2115 -primary team is trying to limit overall sedation.  Failed SBT today due to weakness.  She has been anxious throughout the evening, tachypneic, uncomfortable appearing despite receiving multiple Versed and fentanyl pushes.  Will utilize Precedex overnight with the intent to minimize as needed Versed usage.  Anticipate turning off sedation by the morning to optimize for SBT again.  Intervention Category Minor Interventions: Routine modifications to care plan (e.g. PRN medications for pain, fever)    04/10/2023, 7:40 PM

## 2023-04-10 NOTE — Progress Notes (Signed)
NAME:  Tamara Castro, MRN:  [id number], DOB:  1961-04-03, LOS: 9 ADMISSION DATE:  04/01/2023, CONSULTATION DATE:  04/01/2023 REFERRING MD:  Salvadore Dom, CHIEF COMPLAINT:  Respiratory Failure   History of Present Illness:  HPI obtained from EMR review as patient is intubated and sedated on MV.    49 yoF with PMH as below who presented to [facility] on 7/5 after 1 week hx of URI symptoms admitted for acute hypoxic and hypercarbic respiratory failure 2/2 RLL PNA requiring intubation, septic shock, AKI and acute heart failure.  Found to have pBNP of 49k with elevated troponin's thought secondary to demand ischemia with echo with normal EF, reduced RV, indeterminate diastolic function.  CTA neg for PE 7/5.  CTH neg 7/9.  Covered with azithromycin/ ceftriaxone, switched to cefepime on 7/7.  Shock and AKI improved and extubated 7/7 but required BiPAP.  Required reintubation 7/10 for respiratory failure.  Increasing WBC 7/13, placed on doxycyline switched to vancomycin with elevated PCT with CXR showing increased bibasilar atelectasis vs infiltrates with increasing pleural effusions.  Initial blood culture x1 w/ stap epi, thought contaminate with repeat BC 7/7 negative.  Some concern for ileus with high OGT residuals with CT a/p showing slightly worse dilation of suspected chronic ileus with prior surgical changes but no transition point, possible emesis of TF since held, remains, NPO on reglan.  Patient has failed SBT for several days, due to apnea despite decreased sedation.  Transferred to [facility] ICU for further care, admitted to PCCM.  Pertinent  Medical History  Tobacco abuse,  COPD, chronic constipation, prior colectomy in 1990's 2/2 diverticulitis, fibromyalgia (on disability since 2008), depression, anxiety, neuropathy   Home meds> xanax (1mg  QID), lexapro, gabapentin, anoro elliptia, zofran  Significant Hospital Events: Including procedures, antibiotic start and stop dates in addition to other pertinent  events   7/5 admitted [facility] CTX and azith started 7/7 extubated. Vanc added and cefepime started; CTX stopped 7/8 vanc stopped 7/10 reintubated  7/16 tx to [facility]. Cefepime stopped 7/17 attempting lasix. Creatinine worse.  Echo EF 60-65%no WMA RV nml  7/18 cr worse after lasix. Weaning but on-going ileus and cr rising presenting obstacle to extubation Trying neostigmine.  7/19 scr worse in spite of holding lasix. Still on low dose precedex. Mental status and renal fxn still barrier to extubation. Nephro consulted > started CRRT.  HD catheter placed. 7/20 [place] heparin stopped d/t bleeding around HD cath. Got DDAVP.  7/21 resumes cefepime for worsening leukocytosis.  7/22 volumes ok w/ SBT but has sig accessory use  7/23- Trach performed at bedside. CRRT holiday per nephrology. Increased Xanax to home dose 1mg  QID- added seroquel 25mg  BID 7/24- has successfully weaned off propofol and converted to precedex infusion. Tolerating 4+ hours of SBT 7/25- discontinued precedex and fentanyl with as needed fentanyl pushes for pain. Tolerated SBT for 3 hours before failing and being placed on full vent support. Oliguric with rising serum creatinine- nephrology requests transfer to [facility] for restarting dialysis   Interim History / Subjective:  Pt remains on sedation with precedex. In no apparent distress, no obvious grimacing or other symptoms suggesting pain. Overnight events of attempting to pull at tube per nursing staff and intermittent restlessness in the bed.   Objective   Blood pressure (!) 110/59, pulse 90, temperature 98.4 F (36.9 C), temperature source Axillary, resp. rate (!) 21, height 5\' 5"  (1.651 m), weight 82.3 kg, SpO2 94%. CVP:  [2 mmHg-15 mmHg] 5 mmHg  Vent Mode: PSV  FiO2 (%):  [30 %] 30 % Set Rate:  [20 bmp] 20 bmp Vt Set:  [450 mL] 450 mL PEEP:  [5 cmH20] 5 cmH20 Pressure Support:  [8 cmH20-10 cmH20] 10 cmH20 Plateau Pressure:  [16 cmH20-18 cmH20] 16 cmH20   Intake/Output  Summary (Last 24 hours) at 04/10/2023 0908 Last data filed at 04/10/2023 0400 Gross per 24 hour  Intake 1508.89 ml  Output 350 ml  Net 1158.89 ml   Filed Weights   04/08/23 0435 04/09/23 0500 04/10/23 0500  Weight: 83.8 kg 83.8 kg 82.3 kg    Examination: Physical Exam HENT:     Mouth/Throat:     Mouth: Mucous membranes are moist.     Pharynx: Oropharynx is clear.     Comments: Tracheostomy present, sutures clean dry intact.  Eyes:     Pupils: Pupils are equal, round, and reactive to light.  Cardiovascular:     Rate and Rhythm: Normal rate and regular rhythm.     Pulses: Normal pulses.     Heart sounds: Normal heart sounds.  Pulmonary:     Breath sounds: Rhonchi present.     Comments: Bl lower lobe rhonchi  Abdominal:     General: Bowel sounds are normal.     Palpations: Abdomen is soft.  Musculoskeletal:     Right lower leg: Edema present.     Left lower leg: Edema present.     Comments: +1 pitting edema Bilateral lower extremity +1 pitting edema Bilateral upper extremity  Neurological:     Comments: Sedated on precedex- not following commands or withdrawing to noxious stimuli, sluggishly opens eyes to touch      Resolved Hospital Problem list   CAD Demand Ischemia Prolonged Qtc Troponin 134--162--101. -continue ASA and statin and continue tele monitoring   Hyperphosphatemia  Assessment & Plan:  Acute hypoxic and hypercarbic respiratory failure secondary to bilateral lower lobe pneumonia, pleural effusions, acute exacerbation of COPD Leukocytosis Imaging concerning for pleural effusions, pneumonia, aspiration pneumonia in a patient with history of tobacco abuse. Leukocytosis improved overnight 22--20, remains afebrile. Trach placed 7/23. MRSA PCR (-). Tracheal aspirate cultures on 7/21 showed normal respiratory flora -Day 5 of 7 -Zosyn IV abx therapy for PNA coverage -Nebulizer therapy with Brovana, Pulmicort, Yupelri and albuterol PRN -VAP bundle (HOB 30  degrees, frequent oral and tracheostomy care) -PAD protocol with goal RASS 0. Discontinuation of precedex and continuous fentanyl infusions (PRN fentanyl IV pushes for pain) -Daily SBT as tolerated and work towards trach collar trials when able Pt will benefit from LTAC for prolonged vent weaning   Shock- related to sedation medication versus above Levophed gtt has been off since 0800 this AM. Hopeful that with less sedation requirements this will remain off. If levophed re-initiated without increased sedation, consider further infectious and etiology of shock workup at that time. -Levophed PRN for MAP goal >65   AKI with oliguira requiring CRRT Hypervolemic hyponatremia Pt in septic shock related to ATN complicated by IV contrast 7/11. Failed diuretic challenge and has required CRRT. (Currently -18LB since admission).  Serum sodium remains at 134, stable. Has been on CRRT holiday since 7/23 and very low UOP -Serum Creatinine has increased 0.98--1.86--2.98 with very low UOP (50cc over last 24 hours), likely will require dialysis and transfer to [place] [facility] per nephrology -Avoid nephrotoxic agents -Renally dose medications -Strict intake and output -Frequent BMP monitoring with Daily labs -If serum sodium continues to worsen would consider restarting dialysis for fluid management    Normocytic  anemia Thrombocytopenia Notably pt had bleeding from HD cath site 7/20 and was given DDAVP. No further bleeding noted on interview over last 24 hours. Thrombocytopenia likely secondary to sepsis, however improved. platelets slightly lower than yesterday 112K--109k, HGB 7.3 this AM. Pt has 50ml UOP and is on CRRT holiday DDX include: hemodilution from fluid overload, GI bleed (low suspicion), re bleeding from HD cath, bone marrow suppression. -continue to monitor with CBC this afternoon   Chronic ileus- improved S/p neostigmine 7/18- pt has had daily BM's with rectal tube in place. -Remove  rectal tube today 7/25 -Reglan q8hrs -Bowel regimen with miralax and colace   Acute metabolic encephalopathy superimposed on anxiety and depression-benzo dependent -Continue home lexapro and xanax -Goal RASS 0- discontinued fentanyl and precedex infusions -Encourage day/night sleep cycle -Frequent Reorientation -Minimize sedation in order to prevent ICU delirium and further complications   Hyperlipidemia -continue statin   Tobaco abuse -Continue to encourage cessation from tobacco abuse in order to mitigate associated risk factors   Goals of care: Pt trached 7/23. Post procedure family states that pt would NOT want to remain on long term ventilator support however are in agreement with LTAC for progressive ventilator weaning as tolerated    Best Practice (right click and "Reselect all SmartList Selections" daily)   Diet/type: tubefeeds DVT prophylaxis: prophylactic heparin  GI prophylaxis: PPI Lines: Central line, Dialysis Catheter, and yes and it is still needed Foley:  Yes, and it is still needed Code Status:  DNR Last date of multidisciplinary goals of care discussion [04/09/2023]  Labs   CBC: Recent Labs  Lab 04/06/23 0336 04/07/23 0417 04/08/23 0506 04/09/23 0451 04/10/23 0503  WBC 16.5* 15.9* 18.9* 22.3* 20.0*  NEUTROABS 13.7* 13.1*  --   --   --   HGB 9.5* 8.9* 9.4* 8.3* 7.3*  HCT 29.1* 27.2* 29.8* 26.2* 23.0*  MCV 95.7 95.4 98.0 98.5 98.3  PLT 85* 86* 106* 112* 109*    Basic Metabolic Panel: Recent Labs  Lab 04/05/23 0356 04/05/23 1650 04/06/23 0336 04/06/23 2031 04/07/23 0417 04/08/23 0506 04/09/23 0451 04/10/23 0503  NA 139  138   < > 135 134* 136 134* 134* 134*  K 3.9  3.9   < > 3.8 3.6 3.8 3.8 4.1 4.4  CL 109  109   < > 104 103 105 104 100 103  CO2 20*  19*   < > 24 25 24 24 22 22   GLUCOSE 104*  105*   < > 150* 138* 130* 127* 127* 121*  BUN 47*  47*   < > 24* 16 14 11 22  38*  CREATININE 2.82*  2.98*   < > 1.65* 1.29* 1.21* 0.98  1.86* 2.98*  CALCIUM 7.8*  7.7*   < > 7.7* 7.5* 7.6* 7.6* 8.0* 7.7*  MG 2.2  --  2.4  --  2.4 2.3  --   --   PHOS 2.8  2.9   < > 1.5*  1.5* 1.8* 1.8* 2.1* 4.9* 4.5   < > = values in this interval not displayed.   GFR: Estimated Creatinine Clearance: 20.7 mL/min (A) (by C-G formula based on SCr of 2.98 mg/dL (H)). Recent Labs  Lab 04/04/23 1309 04/05/23 0356 04/07/23 0417 04/08/23 0506 04/09/23 0451 04/10/23 0503  PROCALCITON 0.82  --   --   --   --   --   WBC  --    < > 15.9* 18.9* 22.3* 20.0*   < > = values in this  interval not displayed.    Liver Function Tests: Recent Labs  Lab 04/06/23 0336 04/06/23 2031 04/07/23 0417 04/08/23 0506 04/09/23 0451 04/10/23 0503  AST 15  --   --   --   --   --   ALT 15  --   --   --   --   --   ALKPHOS 49  --   --   --   --   --   BILITOT 0.7  --   --   --   --   --   PROT 5.5*  --   --   --   --   --   ALBUMIN 2.5*  2.4* 2.3* 2.4* 2.6* 2.3* 2.0*   No results for input(s): "LIPASE", "AMYLASE" in the last 168 hours. No results for input(s): "AMMONIA" in the last 168 hours.  ABG    Component Value Date/Time   PHART 7.26 (L) 04/01/2023 1630   PCO2ART 52 (H) 04/01/2023 1630   PO2ART 106 04/01/2023 1630   HCO3 23.3 04/01/2023 1630   ACIDBASEDEF 4.1 (H) 04/01/2023 1630   O2SAT 74.2 04/03/2023 1146     Coagulation Profile: No results for input(s): "INR", "PROTIME" in the last 168 hours.  Cardiac Enzymes: No results for input(s): "CKTOTAL", "CKMB", "CKMBINDEX", "TROPONINI" in the last 168 hours.  HbA1C: No results found for: "HGBA1C"  CBG: Recent Labs  Lab 04/09/23 1604 04/09/23 1913 04/10/23 0011 04/10/23 0401 04/10/23 0747  GLUCAP 116* 134* 140* 118* 131*    Review of Systems:   Negative except as listed in HPI and POC.  Critical care time: 35 minutes    Janeece Riggers, AGACNP-BC [facility] For pager details, please see AMION or use EPIC chat After 1900, please call [facility]  for cross coverage needs 04/10/2023 9:09 AM

## 2023-04-10 NOTE — Plan of Care (Signed)
Normocytic anemia Thrombocytopenia Recheck of CBC shows h/h stable at 7.8/24.7 increased from overnight labs. Likely secondary to hemodilution, will continue to monitor with AM CBC

## 2023-04-10 NOTE — Evaluation (Signed)
SLP Cancellation Note  Patient Details Name: NOLAH KRENZER MRN: [id number] DOB: 1961-06-15   Cancelled treatment:       Reason Eval/Treat Not Completed: Medical issues which prohibited therapy (RN reports pt required to be put back on vent at this time and having discomfort; Will continue efforts)   Chales Abrahams 04/10/2023, 9:37 AM   Rolena Infante, MS [facility] SLP Acute Rehab Services Office [phone]

## 2023-04-10 NOTE — Progress Notes (Signed)
PT Cancellation Note  Patient Details Name: GENICE KIMBERLIN MRN: [id number] DOB: [birth year]   Cancelled Treatment:     Patient  sedated on vent. Will check back tomorrow. Blanchard Kelch PT Acute Rehabilitation Services Office [phone] Weekend pager-[phone]    Rada Hay 04/10/2023, 7:05 AM

## 2023-04-10 NOTE — Progress Notes (Signed)
PT Cancellation Note  Patient Details Name: Tamara Castro MRN: [id number] DOB: Jul 17, 1961   Cancelled Treatment:    Reason Eval/Treat Not Completed: Medical issues which prohibited therapy;Fatigue/lethargy limiting ability to participate Blanchard Kelch PT Acute Rehabilitation Services Office [phone] Weekend pager-[phone]   Rada Hay 04/10/2023, 10:34 AM

## 2023-04-11 ENCOUNTER — Inpatient Hospital Stay (HOSPITAL_COMMUNITY): Payer: 59

## 2023-04-11 DIAGNOSIS — Z930 Tracheostomy status: Secondary | ICD-10-CM

## 2023-04-11 DIAGNOSIS — J9601 Acute respiratory failure with hypoxia: Secondary | ICD-10-CM | POA: Diagnosis not present

## 2023-04-11 DIAGNOSIS — K567 Ileus, unspecified: Secondary | ICD-10-CM | POA: Diagnosis not present

## 2023-04-11 DIAGNOSIS — R569 Unspecified convulsions: Secondary | ICD-10-CM | POA: Diagnosis not present

## 2023-04-11 LAB — LACTIC ACID, PLASMA: Lactic Acid, Venous: 0.9 mmol/L (ref 0.5–1.9)

## 2023-04-11 LAB — BASIC METABOLIC PANEL
Anion gap: 14 (ref 5–15)
BUN: 51 mg/dL — ABNORMAL HIGH (ref 8–23)
CO2: 20 mmol/L — ABNORMAL LOW (ref 22–32)
Calcium: 8.4 mg/dL — ABNORMAL LOW (ref 8.9–10.3)
Chloride: 100 mmol/L (ref 98–111)
Creatinine, Ser: 4.17 mg/dL — ABNORMAL HIGH (ref 0.44–1.00)
GFR, Estimated: 11 mL/min — ABNORMAL LOW (ref >60)
Glucose, Bld: 108 mg/dL — ABNORMAL HIGH (ref 70–99)
Potassium: 5.2 mmol/L — ABNORMAL HIGH (ref 3.5–5.1)
Sodium: 134 mmol/L — ABNORMAL LOW (ref 135–145)

## 2023-04-11 LAB — CBC
HCT: 23.1 % — ABNORMAL LOW (ref 36.0–46.0)
Hemoglobin: 7.6 g/dL — ABNORMAL LOW (ref 12.0–15.0)
MCH: 32.6 pg (ref 26.0–34.0)
MCHC: 32.9 g/dL (ref 30.0–36.0)
MCV: 99.1 fL (ref 80.0–100.0)
Platelets: 141 10*3/uL — ABNORMAL LOW (ref 150–400)
RBC: 2.33 MIL/uL — ABNORMAL LOW (ref 3.87–5.11)
RDW: 16.3 % — ABNORMAL HIGH (ref 11.5–15.5)
WBC: 16.3 10*3/uL — ABNORMAL HIGH (ref 4.0–10.5)
nRBC: 0 % (ref 0.0–0.2)

## 2023-04-11 LAB — CBC WITH DIFFERENTIAL/PLATELET
Abs Immature Granulocytes: 0.19 10*3/uL — ABNORMAL HIGH (ref 0.00–0.07)
Basophils Absolute: 0 10*3/uL (ref 0.0–0.1)
Basophils Relative: 0 %
Eosinophils Absolute: 0.1 10*3/uL (ref 0.0–0.5)
Eosinophils Relative: 0 %
HCT: 24 % — ABNORMAL LOW (ref 36.0–46.0)
Hemoglobin: 7.8 g/dL — ABNORMAL LOW (ref 12.0–15.0)
Immature Granulocytes: 1 %
Lymphocytes Relative: 3 %
Lymphs Abs: 0.6 10*3/uL — ABNORMAL LOW (ref 0.7–4.0)
MCH: 32.1 pg (ref 26.0–34.0)
MCHC: 32.5 g/dL (ref 30.0–36.0)
MCV: 98.8 fL (ref 80.0–100.0)
Monocytes Absolute: 1.1 10*3/uL — ABNORMAL HIGH (ref 0.1–1.0)
Monocytes Relative: 6 %
Neutro Abs: 16.4 10*3/uL — ABNORMAL HIGH (ref 1.7–7.7)
Neutrophils Relative %: 90 %
Platelets: 170 10*3/uL (ref 150–400)
RBC: 2.43 MIL/uL — ABNORMAL LOW (ref 3.87–5.11)
RDW: 16.1 % — ABNORMAL HIGH (ref 11.5–15.5)
WBC: 18.3 10*3/uL — ABNORMAL HIGH (ref 4.0–10.5)
nRBC: 0 % (ref 0.0–0.2)

## 2023-04-11 LAB — GLUCOSE, CAPILLARY
Glucose-Capillary: 106 mg/dL — ABNORMAL HIGH (ref 70–99)
Glucose-Capillary: 111 mg/dL — ABNORMAL HIGH (ref 70–99)
Glucose-Capillary: 110 mg/dL — ABNORMAL HIGH (ref 70–99)
Glucose-Capillary: 91 mg/dL (ref 70–99)
Glucose-Capillary: 97 mg/dL (ref 70–99)
Glucose-Capillary: 90 mg/dL (ref 70–99)

## 2023-04-11 LAB — RENAL FUNCTION PANEL
Albumin: 2 g/dL — ABNORMAL LOW (ref 3.5–5.0)
Anion gap: 16 — ABNORMAL HIGH (ref 5–15)
BUN: 52 mg/dL — ABNORMAL HIGH (ref 8–23)
CO2: 19 mmol/L — ABNORMAL LOW (ref 22–32)
Calcium: 8.7 mg/dL — ABNORMAL LOW (ref 8.9–10.3)
Chloride: 98 mmol/L (ref 98–111)
Creatinine, Ser: 4.37 mg/dL — ABNORMAL HIGH (ref 0.44–1.00)
GFR, Estimated: 11 mL/min — ABNORMAL LOW (ref >60)
Glucose, Bld: 121 mg/dL — ABNORMAL HIGH (ref 70–99)
Phosphorus: 5.5 mg/dL — ABNORMAL HIGH (ref 2.5–4.6)
Potassium: 5.2 mmol/L — ABNORMAL HIGH (ref 3.5–5.1)
Sodium: 133 mmol/L — ABNORMAL LOW (ref 135–145)

## 2023-04-11 LAB — MAGNESIUM
Magnesium: 2.5 mg/dL — ABNORMAL HIGH (ref 1.7–2.4)
Magnesium: 2.5 mg/dL — ABNORMAL HIGH (ref 1.7–2.4)

## 2023-04-11 LAB — CULTURE, RESPIRATORY W GRAM STAIN

## 2023-04-11 LAB — TYPE AND SCREEN
ABO/RH(D): A POS
Antibody Screen: NEGATIVE

## 2023-04-11 LAB — PHOSPHORUS
Phosphorus: 5.5 mg/dL — ABNORMAL HIGH (ref 2.5–4.6)
Phosphorus: 5.5 mg/dL — ABNORMAL HIGH (ref 2.5–4.6)

## 2023-04-11 LAB — HEPATITIS B SURFACE ANTIGEN: Hepatitis B Surface Ag: NONREACTIVE

## 2023-04-11 LAB — PROCALCITONIN: Procalcitonin: 1.62 ng/mL

## 2023-04-11 MED ORDER — ALPRAZOLAM 0.5 MG PO TABS
1.0000 mg | ORAL_TABLET | Freq: Two times a day (BID) | ORAL | Status: DC
  Administered 2023-04-11 (×2): 1 mg
  Filled 2023-04-11 (×2): qty 2

## 2023-04-11 MED ORDER — CHLORHEXIDINE GLUCONATE CLOTH 2 % EX PADS
6.0000 | MEDICATED_PAD | Freq: Every day | CUTANEOUS | Status: DC
  Administered 2023-04-12 – 2023-04-16 (×5): 6 via TOPICAL

## 2023-04-11 MED ORDER — PIPERACILLIN-TAZOBACTAM IN DEX 2-0.25 GM/50ML IV SOLN
2.2500 g | Freq: Three times a day (TID) | INTRAVENOUS | Status: DC
  Administered 2023-04-11 – 2023-04-13 (×5): 2.25 g via INTRAVENOUS
  Filled 2023-04-11 (×6): qty 50

## 2023-04-11 MED ORDER — QUETIAPINE FUMARATE 25 MG PO TABS: 25.0000 mg | ORAL_TABLET | Freq: Every day | ORAL | Status: DC

## 2023-04-11 MED ORDER — QUETIAPINE FUMARATE 25 MG PO TABS
25.0000 mg | ORAL_TABLET | Freq: Every day | ORAL | Status: DC
  Administered 2023-04-11: 25 mg
  Filled 2023-04-11: qty 1

## 2023-04-11 MED ORDER — FENTANYL CITRATE PF 50 MCG/ML IJ SOSY
50.0000 ug | PREFILLED_SYRINGE | INTRAMUSCULAR | Status: DC | PRN
  Administered 2023-04-11 – 2023-04-13 (×4): 100 ug via INTRAVENOUS
  Administered 2023-04-14: 50 ug via INTRAVENOUS
  Administered 2023-04-15 – 2023-04-16 (×2): 100 ug via INTRAVENOUS
  Administered 2023-04-23: 50 ug via INTRAVENOUS
  Administered 2023-04-24 (×2): 100 ug via INTRAVENOUS
  Administered 2023-04-24: 50 ug via INTRAVENOUS
  Administered 2023-04-25 – 2023-04-28 (×3): 100 ug via INTRAVENOUS
  Filled 2023-04-11: qty 2
  Filled 2023-04-11: qty 1
  Filled 2023-04-11 (×8): qty 2
  Filled 2023-04-11: qty 1
  Filled 2023-04-11 (×2): qty 2
  Filled 2023-04-11: qty 1
  Filled 2023-04-11: qty 2
  Filled 2023-04-11: qty 1
  Filled 2023-04-11: qty 2

## 2023-04-11 NOTE — Progress Notes (Signed)
Advanced  NG by 6cm. Per radiology, MD aware and ok to advance

## 2023-04-11 NOTE — Progress Notes (Signed)
PHARMACY NOTE:  ANTIMICROBIAL RENAL DOSAGE ADJUSTMENT  Current antimicrobial regimen includes a mismatch between antimicrobial dosage and estimated renal function.  As per policy approved by the Pharmacy & Therapeutics and Medical Executive Committees, the antimicrobial dosage will be adjusted accordingly.  Current antimicrobial dosage:  Zosyn 3.375g IV q12h (4-hr infusion)  Indication: PNA  Renal Function: [x]      On intermittent HD, scheduled:    Antimicrobial dosage has been changed to:  Zosyn 2.25g IV q8h  Additional comments: transitioning from CRRT to IHD   Leia Alf, PharmD, BCPS Please check AMION for all [facility] contact numbers Clinical Pharmacist 04/11/2023 9:37 AM

## 2023-04-11 NOTE — Progress Notes (Signed)
[facility] Progress Note  Subjective: 125 cc UOP overnight. Bun/ creat up to 52/ 4.37. pt on vent, nods head to general questions  Vitals:   04/11/23 1100 04/11/23 1200 04/11/23 1300 04/11/23 1400  BP: 126/74 (!) 140/76 (!) 147/78 139/73  Pulse: (!) 111 (!) 108 (!) 107 (!) 108  Resp: (!) 31 (!) 28 (!) 26 (!) 24  Temp:  97.8 F (36.6 C)    TempSrc:  Oral    SpO2: 95% 95% 95% 94%  Weight:      Height:        Exam: General: ill-looking adult WF, extubated, trach in place, on vent Heart:RRR, s1s2 nl Lungs: Coarse breath sound bilateral Abdomen:soft, non-distended Extremities: 1-2+ bilat UE/ LE edema Dialysis Access: Right IJ temp cath in place     UA 7/16 - mod Hb, ket 5, 100 prot, rare bact, 11-20rbc, 21-50 wbc, 0-5 epi   CT abd 7/17 - normal appearing kidneys w/o hydronephrosis    Assessment/ Plan: Acute kidney injury - b/l creat 1.0 from jan 2023. Creat here was 1.6 on admission in setting of pneumonia, hypotension and septic shock. Creat peaked at 1.7 and then returned to 1.0 on 7/12. Renal failure worsened again on 7/15 up to 1.48 and then up to 3.84 on on 7/19. CRRT started 7/19. AKI felt to be related to hypotension and IV contrast + some diuretics given. Pt remains oliguric. CRRT holiday started 7/23 but no signs of renal recovery. Moved to [facility] for iHD. HD today/ tonight.  Acute hypoxic respiratory failure - due to COPD/ pna, on vent w/ trach per CCM. F/u CXR 7/23 was negative for edema.   Sepsis - initial dx on admission w/ pna and septic shock. Is off pressors as of the last 24- 48hrs.  Volume ^/lower extremity edema - edema is better overall but still 1-2+. UF 2-2.5 L w/ HD as tolerated.  Anemia - Hb 7- 9 range, tranfuse prn COPD Anxiety/ depression   Tamara Moselle MD  [facility] 04/11/2023, 12:32 PM  Recent Labs  Lab 04/10/23 0503 04/10/23 1618 04/11/23 0446 04/11/23 0804  HGB 7.3*   < > 7.6* 7.8*  ALBUMIN 2.0*  --   --  2.0*  CALCIUM 7.7*  --  8.4*  8.7*  PHOS 4.5  --  5.5* 5.5*  5.5*  CREATININE 2.98*  --  4.17* 4.37*  K 4.4  --  5.2* 5.2*   < > = values in this interval not displayed.   No results for input(s): "IRON", "TIBC", "FERRITIN" in the last 168 hours. Inpatient medications:  ALPRAZolam  1 mg Per Tube BID   arformoterol  15 mcg Nebulization BID   aspirin  81 mg Per Tube Daily   atorvastatin  20 mg Per Tube Daily   budesonide (PULMICORT) nebulizer solution  0.5 mg Nebulization BID   Chlorhexidine Gluconate Cloth  6 each Topical Daily   docusate  100 mg Per Tube BID   escitalopram  10 mg Per Tube Daily   feeding supplement (PIVOT 1.5 CAL)  1,000 mL Per Tube Q24H   Gerhardt's butt cream   Topical BID   heparin injection (subcutaneous)  5,000 Units Subcutaneous Q8H   insulin aspart  0-6 Units Subcutaneous Q4H   metoCLOPramide  5 mg Per Tube Q8H   multivitamin  1 tablet Per Tube QHS   mouth rinse  15 mL Mouth Rinse Q2H   pantoprazole (PROTONIX) IV  40 mg Intravenous Q12H   polyethylene glycol  17 g Per NG tube Daily   QUEtiapine  25 mg Per Tube QHS   revefenacin  175 mcg Nebulization Daily   sodium chloride flush  10-40 mL Intracatheter Q12H    sodium chloride Stopped (04/11/23 0913)   ondansetron (ZOFRAN) IV Stopped (04/11/23 0937)   piperacillin-tazobactam (ZOSYN)  IV     acetaminophen (TYLENOL) oral liquid 160 mg/5 mL, albuterol, bisacodyl, fentaNYL (SUBLIMAZE) injection, fentaNYL (SUBLIMAZE) injection, hydrALAZINE, lip balm, midazolam, ondansetron (ZOFRAN) IV, mouth rinse, oxyCODONE, phenol, pneumococcal 20-valent conjugate vaccine, polyvinyl alcohol, sodium chloride flush

## 2023-04-11 NOTE — Plan of Care (Signed)
  Problem: Clinical Measurements: Goal: Respiratory complications will improve Outcome: Progressing   Problem: Activity: Goal: Risk for activity intolerance will decrease Outcome: Not Progressing   Problem: Coping: Goal: Level of anxiety will decrease Outcome: Not Progressing

## 2023-04-11 NOTE — Progress Notes (Signed)
PT Cancellation Note  Patient Details Name: Tamara Castro MRN: [id number] DOB: 1961-05-12   Cancelled Treatment:    Reason Eval/Treat Not Completed: Medical issues which prohibited therapy (pt vomiting, NGT placed and not currently appropriate)    B  04/11/2023, 9:19 AM Merryl Hacker, PT Acute Rehabilitation Services Office: [phone]

## 2023-04-11 NOTE — TOC Progression Note (Addendum)
Transition of Care [facility]) - Progression Note   Patient Details  Name: [name] MRN: [id number] Date of Birth: Oct 07, 1960  [facility]) CM/SW Contact  Ewing Schlein, LCSW Phone Number: 04/11/2023, 8:37 AM  Clinical Narrative: Patient has transferred to [facility] and will be followed by [facility].  Expected Discharge Plan: Long Term Acute Care (LTAC) (TBD) Barriers to Discharge: Continued Medical Work up  Expected Discharge Plan and Services In-house Referral: Clinical Social Work Living arrangements for the past 2 months: Single Family Home  Social Determinants of Health (SDOH) Interventions SDOH Screenings   Food Insecurity: No Food Insecurity (04/04/2023)  Housing: Low Risk  (04/04/2023)  Transportation Needs: No Transportation Needs (04/04/2023)  Utilities: Not At Risk (04/04/2023)  Alcohol Screen: Low Risk  (04/09/2021)  Depression (PHQ2-9): Low Risk  (04/09/2021)  Financial Resource Strain: Low Risk  (04/09/2021)  Physical Activity: Insufficiently Active (04/09/2021)  Social Connections: Socially Isolated (04/09/2021)  Stress: No Stress Concern Present (04/09/2021)  Tobacco Use: High Risk (04/04/2023)   Readmission Risk Interventions     No data to display

## 2023-04-11 NOTE — Progress Notes (Signed)
NAME:  Tamara Castro, MRN:  [id number], DOB:  05-27-1961, LOS: 10 ADMISSION DATE:  04/01/2023, CONSULTATION DATE:  04/01/2023 REFERRING MD:  Salvadore Dom, CHIEF COMPLAINT:  Respiratory Failure   History of Present Illness:  HPI obtained from EMR review as patient is intubated and sedated on MV.    62 yoF with PMH as below who presented to [facility] on 7/5 after 1 week hx of URI symptoms admitted for acute hypoxic and hypercarbic respiratory failure 2/2 RLL PNA requiring intubation, septic shock, AKI and acute heart failure.  Found to have pBNP of 49k with elevated troponin's thought secondary to demand ischemia with echo with normal EF, reduced RV, indeterminate diastolic function.  CTA neg for PE 7/5.  CTH neg 7/9.  Covered with azithromycin/ ceftriaxone, switched to cefepime on 7/7.  Shock and AKI improved and extubated 7/7 but required BiPAP.  Required reintubation 7/10 for respiratory failure.  Increasing WBC 7/13, placed on doxycyline switched to vancomycin with elevated PCT with CXR showing increased bibasilar atelectasis vs infiltrates with increasing pleural effusions.  Initial blood culture x1 w/ stap epi, thought contaminate with repeat BC 7/7 negative.  Some concern for ileus with high OGT residuals with CT a/p showing slightly worse dilation of suspected chronic ileus with prior surgical changes but no transition point, possible emesis of TF since held, remains, NPO on reglan.  Patient has failed SBT for several days, due to apnea despite decreased sedation.  Transferred to [facility] ICU for further care, admitted to PCCM.  Pertinent  Medical History  Tobacco abuse,  COPD, chronic constipation, prior colectomy in 1990's 2/2 diverticulitis, fibromyalgia (on disability since 2008), depression, anxiety, neuropathy   Home meds> xanax (1mg  QID), lexapro, gabapentin, anoro elliptia, zofran  Significant Hospital Events: Including procedures, antibiotic start and stop dates in addition to other pertinent  events   7/5 admitted [facility] CTX and azith started 7/7 extubated. Vanc added and cefepime started; CTX stopped 7/8 vanc stopped 7/10 reintubated  7/16 tx to [facility]. Cefepime stopped 7/17 attempting lasix. Creatinine worse.  Echo EF 60-65%no WMA RV nml  7/18 cr worse after lasix. Weaning but on-going ileus and cr rising presenting obstacle to extubation Trying neostigmine.  7/19 scr worse in spite of holding lasix. Still on low dose precedex. Mental status and renal fxn still barrier to extubation. Nephro consulted > started CRRT.  HD catheter placed. 7/20 [place] heparin stopped d/t bleeding around HD cath. Got DDAVP.  7/21 resumes cefepime for worsening leukocytosis.  7/22 volumes ok w/ SBT but has sig accessory use  7/23- Trach performed at bedside. CRRT holiday per nephrology. Increased Xanax to home dose 1mg  QID- added seroquel 25mg  BID 7/24- has successfully weaned off propofol and converted to precedex infusion. Tolerating 4+ hours of SBT 7/25- discontinued precedex and fentanyl with as needed fentanyl pushes for pain. Tolerated SBT for 3 hours before failing and being placed on full vent support. Oliguric with rising serum creatinine- nephrology requests transfer to [facility] for restarting dialysis   Interim History / Subjective:  Overnight:Patient was hypertensive with blood pressures into the 180s.  Thought to be due to anxiety.  Goal is to sedation to optimize patient for SBT  Patient evaluated bedside this morning.  Patient had multiple vomiting episodes.  No blood appreciated.  Did ask nursing to put in NG tube and hooked up to suction.  Objective   Blood pressure (!) 145/100, pulse (!) 117, temperature 99.2 F (37.3 C), temperature source Oral, resp. rate (!) 27,  height 5\' 5"  (1.651 m), weight 86.6 kg, SpO2 94%.  Output of only 125 mL of urine yesterday.  Afebrile.  Pulse 100s, 110s tracheostomy on ventilator satting at 92-96% currently on Precedex. CVP:  [5 mmHg-44 mmHg] 12 mmHg  Vent  Mode: PRVC FiO2 (%):  [30 %] 30 % Set Rate:  [20 bmp] 20 bmp Vt Set:  [450 mL] 450 mL PEEP:  [5 cmH20] 5 cmH20 Pressure Support:  [10 cmH20] 10 cmH20 Plateau Pressure:  [24 cmH20-31 cmH20] 24 cmH20   Intake/Output Summary (Last 24 hours) at 04/11/2023 0981 Last data filed at 04/11/2023 0700 Gross per 24 hour  Intake 1768.17 ml  Output 529 ml  Net 1239.17 ml   Filed Weights   04/09/23 0500 04/10/23 0500 04/11/23 0500  Weight: 83.8 kg 82.3 kg 86.6 kg    Examination: General: Critically ill, tracheostomy, on vent, alert, but no follow any instructions  Eyes: Unable to track me across the room  Head: Normocephalic, atraumatic  Cardio: Tachycardic, no murmurs, rubs or gallops Pulmonary: Very coarse breath sounds bilaterally, diminished bilaterally, no wheezing  Abdomen: Soft, with bruising noted throughout, no tenderness, slight distension appreciated, no peritonitic signs Neuro: Not awake, not following any instructions, unable to track me across room, not able to perform full neuroexam given altered mental status Skin: No rashes noted  Extremities: Bilateral lower and upper extremities with 1-2+ pitting edema   Labs reviewed: Glucose 106-132 CBC white count 19.5, hemoglobin 7.8, platelet 122 renal function panel sodium 134, potassium 4.4, bicarb 22, creatinine 2.98  Respiratory culture growing gram-positive cocci in clusters and few yeast with pseudohyphae  Resolved Hospital Problem list   CAD Demand Ischemia Prolonged Qtc Troponin 134--162--101. -continue ASA and statin and continue tele monitoring Hyperphosphatemia Septic shock Thrombocytopenia  Assessment & Plan:   This is a 62 year old female with a past medical history of COPD and a chronic ileus who was initially treated for After developing acute hypoxemic and hypercarbic respiratory failure requiring mechanical ventilation complicated by septic shock and AKI.  Patient has had prolonged requirement for mechanical  ventilation and is now status post tracheostomy.  #Sepsis #Acute hypoxic and hypercarbic respiratory failure secondary to CAP #COPD Patient evaluated bedside this morning, day 5 of Zosyn.  Plan will be for 7-day after Zosyn.  Patient still requiring ventilator, and is not quite ready for SBT at this time.  Patient has good amount of secretions, and is still requiring oxygen.  Do not think patient is currently having COPD exacerbation.  Do not think currently patient has any sort of effusion that could be acted on.  Will need to continue with mechanical ventilation, try to wean off ventilator as tolerated. -Continue Zosyn day 6 of 7 -Continue to wean off ventilator as tolerated -Continue Brovana nebulizers -Continue budesonide nebulizers -Continue Yupelri nebulizers -Monitor for any signs of worsening respiratory distress -Follow-up respiratory culture -VAP bundle (HOB 30 degrees, frequent oral and tracheostomy care)  #Acute encephalopathy #Anxiety Patient evaluated bedside this morning.  Patient not able to follow any instructions.  Patient not opening her eyes to voice.  Patient not able to interact this morning.  This could likely be related to underlying sepsis.  Could be related to underlying uremia as well.  I also believe anxiety is playing a role in this as well.  Will continue to reorient patient and help control anxiety.  Patient does develop any acute neurological deficits, with proceed with imaging. -ICU delirium precautions -Intermittent dialysis for uremia -Continue low-dose Seroquel  only at bedtime -As needed Versed for agitation -As needed fentanyl -Continue Xanax 1 mg twice daily -Continue Lexapro 10 mg daily  #Acute kidney injury likely second to ischemic ATN #Hyponatremia Patient is still anuric.  No urine production since patient has come to [facility] [address]  Nephrology following along.  Will likely start intermittent dialysis today.  Patient is hyponatremic, likely  hypervolemic.  Patient did have some bloody output from urinary Foley catheter.  Will flush today. -Begin intermittent HD today -Nephrology following -Monitor for urine output -Flush Urinary catheter -Strict I's and O's  #Acute on chronic ileus Initially thought to be resolved, seems to still be complicating picture.  Patient had multiple vomiting episodes this morning.  Hypoactive bowel sounds.  Repeat abdominal imaging showing consistent ileus.  Will continue on Flexi-Seal.  Continue with Reglan. -Continue Reglan 5 mg every 8 hours  #Sinus tachycardia Patient has tachycardic rate into the 110s and 120s.  This is likely secondary to underlying anxiety.  Patient is sinus tachycardia, no irregular rhythm like A-fib or SVT.  Will try to control agitation and anxiety, and likely should resolve. -Continue with treatment for agitation and anxiety  #Anemia of critical illness #Leukocytosis Hemoglobin 7.8 this morning.  No signs of bleeding.  Leukocytosis up to 18.  Platelets have been stable. -Likely related to critical illness -Continue to monitor -Transfuse if hemoglobin drops below 7  #Goals of care Pt trached 7/23. Post procedure family states that pt would NOT want to remain on long term ventilator support however are in agreement with LTAC for progressive ventilator weaning as tolerated   Best Practice (right click and "Reselect all SmartList Selections" daily)   Diet/type: tubefeeds DVT prophylaxis: prophylactic heparin  GI prophylaxis: PPI Lines: Central line, Dialysis Catheter, and yes and it is still needed Foley:  Yes, and it is still needed Code Status:  DNR Last date of multidisciplinary goals of care discussion [04/09/2023]  Labs   CBC: Recent Labs  Lab 04/06/23 0336 04/07/23 0417 04/08/23 0506 04/09/23 0451 04/10/23 0503 04/10/23 1618  WBC 16.5* 15.9* 18.9* 22.3* 20.0* 19.5*  NEUTROABS 13.7* 13.1*  --   --   --   --   HGB 9.5* 8.9* 9.4* 8.3* 7.3* 7.8*  HCT  29.1* 27.2* 29.8* 26.2* 23.0* 24.7*  MCV 95.7 95.4 98.0 98.5 98.3 99.6  PLT 85* 86* 106* 112* 109* 122*    Basic Metabolic Panel: Recent Labs  Lab 04/05/23 0356 04/05/23 1650 04/06/23 0336 04/06/23 2031 04/07/23 0417 04/08/23 0506 04/09/23 0451 04/10/23 0503  NA 139  138   < > 135 134* 136 134* 134* 134*  K 3.9  3.9   < > 3.8 3.6 3.8 3.8 4.1 4.4  CL 109  109   < > 104 103 105 104 100 103  CO2 20*  19*   < > 24 25 24 24 22 22   GLUCOSE 104*  105*   < > 150* 138* 130* 127* 127* 121*  BUN 47*  47*   < > 24* 16 14 11 22  38*  CREATININE 2.82*  2.98*   < > 1.65* 1.29* 1.21* 0.98 1.86* 2.98*  CALCIUM 7.8*  7.7*   < > 7.7* 7.5* 7.6* 7.6* 8.0* 7.7*  MG 2.2  --  2.4  --  2.4 2.3  --   --   PHOS 2.8  2.9   < > 1.5*  1.5* 1.8* 1.8* 2.1* 4.9* 4.5   < > = values in this interval not  displayed.   GFR: Estimated Creatinine Clearance: 21.3 mL/min (A) (by C-G formula based on SCr of 2.98 mg/dL (H)). Recent Labs  Lab 04/04/23 1309 04/05/23 0356 04/08/23 0506 04/09/23 0451 04/10/23 0503 04/10/23 1618  PROCALCITON 0.82  --   --   --   --   --   WBC  --    < > 18.9* 22.3* 20.0* 19.5*   < > = values in this interval not displayed.    Liver Function Tests: Recent Labs  Lab 04/06/23 0336 04/06/23 2031 04/07/23 0417 04/08/23 0506 04/09/23 0451 04/10/23 0503  AST 15  --   --   --   --   --   ALT 15  --   --   --   --   --   ALKPHOS 49  --   --   --   --   --   BILITOT 0.7  --   --   --   --   --   PROT 5.5*  --   --   --   --   --   ALBUMIN 2.5*  2.4* 2.3* 2.4* 2.6* 2.3* 2.0*   No results for input(s): "LIPASE", "AMYLASE" in the last 168 hours. No results for input(s): "AMMONIA" in the last 168 hours.  ABG    Component Value Date/Time   PHART 7.26 (L) 04/01/2023 1630   PCO2ART 52 (H) 04/01/2023 1630   PO2ART 106 04/01/2023 1630   HCO3 23.3 04/01/2023 1630   ACIDBASEDEF 4.1 (H) 04/01/2023 1630   O2SAT 74.2 04/03/2023 1146     Coagulation Profile: No results for  input(s): "INR", "PROTIME" in the last 168 hours.  Cardiac Enzymes: No results for input(s): "CKTOTAL", "CKMB", "CKMBINDEX", "TROPONINI" in the last 168 hours.  HbA1C: No results found for: "HGBA1C"  CBG: Recent Labs  Lab 04/10/23 1240 04/10/23 1630 04/10/23 1918 04/10/23 2324 04/11/23 0327  GLUCAP 121* 132* 130* 115* 106*    Review of Systems:   Negative except as listed in HPI and POC.  Critical care time: 35 minutes    Modena Slater, DO  Internal Medicine Resident PGY-2  [phone]  After 1900, please call [facility] for cross coverage needs 04/11/2023 7:24 AM

## 2023-04-11 NOTE — Progress Notes (Signed)
SLP Cancellation Note  Patient Details Name: Tamara Castro MRN: [id number] DOB: 01-Oct-1960   Cancelled treatment:        Attempted to see pt for swallowing and PMSV evaluations. Pt still requiring ventilator support at this time and RN reports ongoing vomiting.  SLP will follow for medical readiness for assessment.  Will check back early next week, but if pt has ST needs over the weekend, please contact speech via [facility] SLP group in Epic secure chat, or Acute Rehab Dept phone [phone].   Kerrie Pleasure, MA, CCC-SLP Acute Rehabilitation Services Office: [phone] 04/11/2023, 9:42 AM

## 2023-04-11 NOTE — Progress Notes (Signed)
OT Cancellation Note  Patient Details Name: Tamara Castro MRN: [id number] DOB: Sep 14, 1961   Cancelled Treatment:    Reason Eval/Treat Not Completed: Medical issues which prohibited therapy RN reports pt with new NG tube placement and profusely vomiting.   Tyler Deis, OTR/L [facility] Acute Rehabilitation Office: [phone]   Myrla Halsted 04/11/2023, 9:19 AM

## 2023-04-11 NOTE — Progress Notes (Signed)
EEG complete - results pending 

## 2023-04-11 NOTE — Procedures (Signed)
Patient Name: MIKAHLA CORLESS  MRN: [id number]  Epilepsy Attending: Charlsie Quest  Referring Physician/Provider: Modena Slater, DO  Date: 04/11/2023 Duration: 26.41 mins  Patient history: 62yo F with ams getting eeg to evaluate for seizure.  Level of alertness: lethargic   AEDs during EEG study: xanax  Technical aspects: This EEG study was done with scalp electrodes positioned according to the 10-20 International system of electrode placement. Electrical activity was reviewed with band pass filter of 1-70Hz , sensitivity of 7 uV/mm, display speed of 89mm/sec with a 60Hz  notched filter applied as appropriate. EEG data were recorded continuously and digitally stored.  Video monitoring was available and reviewed as appropriate.  Description: EEG showed continuous generalized polymorphic 3 to 6 Hz theta-delta slowing. Hyperventilation and photic stimulation were not performed.     ABNORMALITY - Continuous slow, generalized  IMPRESSION: This study is suggestive of moderate to severe diffuse encephalopathy, nonspecific etiology. No seizures or epileptiform discharges were seen throughout the recording.   Annabelle Harman

## 2023-04-12 DIAGNOSIS — J9601 Acute respiratory failure with hypoxia: Secondary | ICD-10-CM | POA: Diagnosis not present

## 2023-04-12 DIAGNOSIS — K567 Ileus, unspecified: Secondary | ICD-10-CM | POA: Diagnosis not present

## 2023-04-12 DIAGNOSIS — Z930 Tracheostomy status: Secondary | ICD-10-CM | POA: Diagnosis not present

## 2023-04-12 LAB — MAGNESIUM: Magnesium: 1.9 mg/dL (ref 1.7–2.4)

## 2023-04-12 LAB — GLUCOSE, CAPILLARY
Glucose-Capillary: 87 mg/dL (ref 70–99)
Glucose-Capillary: 100 mg/dL — ABNORMAL HIGH (ref 70–99)
Glucose-Capillary: 84 mg/dL (ref 70–99)
Glucose-Capillary: 88 mg/dL (ref 70–99)
Glucose-Capillary: 81 mg/dL (ref 70–99)
Glucose-Capillary: 89 mg/dL (ref 70–99)

## 2023-04-12 MED ORDER — MAGNESIUM SULFATE IN D5W 1-5 GM/100ML-% IV SOLN
1.0000 g | Freq: Once | INTRAVENOUS | Status: AC
  Administered 2023-04-12: 1 g via INTRAVENOUS
  Filled 2023-04-12: qty 100

## 2023-04-12 MED ORDER — METOCLOPRAMIDE HCL 5 MG/ML IJ SOLN
5.0000 mg | Freq: Three times a day (TID) | INTRAMUSCULAR | Status: AC
  Administered 2023-04-12 – 2023-04-14 (×6): 5 mg via INTRAVENOUS
  Filled 2023-04-12 (×5): qty 2

## 2023-04-12 MED ORDER — POTASSIUM CHLORIDE 10 MEQ/100ML IV SOLN
10.0000 meq | INTRAVENOUS | Status: AC
  Administered 2023-04-12 (×2): 10 meq via INTRAVENOUS
  Filled 2023-04-12: qty 100

## 2023-04-12 MED ORDER — ALPRAZOLAM 0.5 MG PO TABS
0.5000 mg | ORAL_TABLET | Freq: Two times a day (BID) | ORAL | Status: DC
  Administered 2023-04-12 (×2): 0.5 mg
  Filled 2023-04-12 (×2): qty 1

## 2023-04-12 NOTE — Evaluation (Signed)
Physical Therapy Evaluation Patient Details Name: JAYVA SZYMKOWIAK MRN: [id number] DOB: 10-27-1960 Today's Date: 04/12/2023  History of Present Illness  62 yo female admitted to [facility] 7/5 with RLL PNA, sepsis and acute HF. Intubated (7/5-7/7, bipap 7/7-7/9, 7/10-present). 7/16 transfer to [facility]. 7/19-7/23 CRRT. 7/23 trach. 7/25 transfer to [facility] for HD. 7/26 EEG with encephalopathy. PMhx: COPD, tobacco abuse, ileus, anxiety, CAD  Clinical Impression  Pt lethargic throughout session without response to sternal rub, assist eye lid opening or noxious stimuli to nail beds all extremities. Only active movement was LLE with wiggle to touching bottom of foot. PROM WFL all extremities with neck rotation bil performed as well. Pt significantly limited by lethargy/cognitive deficits and unable to actively participate at this time. PROM performed with nursing encouraged to continue. Pt will benefit from trial of acute therapy to determine ability to interact and progress function to decrease burden of care.   PRVC 30% FiO2, peep 5 HR 105 SPO2 96%      Assistance Recommended at Discharge Frequent or constant Supervision/Assistance  If plan is discharge home, recommend the following:  Can travel by private vehicle  Two people to help with walking and/or transfers;Two people to help with bathing/dressing/bathroom;Direct supervision/assist for medications management;Assistance with feeding;Assistance with cooking/housework;Direct supervision/assist for financial management;Assist for transportation;Help with stairs or ramp for entrance   No    Equipment Recommendations Wheelchair cushion (measurements PT);Wheelchair (measurements PT);Hospital bed (hoyer)  Recommendations for Other Services       Functional Status Assessment Patient has had a recent decline in their functional status and/or demonstrates limited ability to make significant improvements in function in a reasonable and  predictable amount of time     Precautions / Restrictions Precautions Precautions: Other (comment) Precaution Comments: vent, track, cortrak, NGT      Mobility  Bed Mobility               General bed mobility comments: total assist    Transfers                        Ambulation/Gait                  Stairs            Wheelchair Mobility     Tilt Bed    Modified Rankin (Stroke Patients Only)       Balance                                             Pertinent Vitals/Pain Pain Assessment Pain Assessment: CPOT Facial Expression: Relaxed, neutral Body Movements: Absence of movements Muscle Tension: Relaxed Compliance with ventilator (intubated pts.): Tolerating ventilator or movement Vocalization (extubated pts.): N/A CPOT Total: 0    Home Living Family/patient expects to be discharged to:: Private residence                   Additional Comments: pt lethargic, unresponsive and no family present to provide    Prior Function                       Hand Dominance        Extremity/Trunk Assessment   Upper Extremity Assessment Upper Extremity Assessment: RUE deficits/detail;LUE deficits/detail RUE Deficits / Details: no AROM, no response to noxious stimuli, PROM WFL LUE Deficits /  Details: no AROM, no response to noxious stimuli, PROM WFL    Lower Extremity Assessment Lower Extremity Assessment: RLE deficits/detail;LLE deficits/detail RLE Deficits / Details: no AROM, no response to noxious stimuli, PROM WFL, initial resistance to hip flexion but released after 3 reps LLE Deficits / Details: foot and leg wiggle to tickling foot, no purposeful movement or command following, PROM WFL       Communication      Cognition Arousal/Alertness: Lethargic   Overall Cognitive Status: Difficult to assess                                          General Comments      Exercises  General Exercises - Upper Extremity Shoulder Flexion: PROM, Both, 10 reps, Supine Shoulder ABduction: PROM, Both, 10 reps, Supine Elbow Flexion: PROM, Both, 10 reps, Supine Elbow Extension: PROM, Both, Supine, 10 reps General Exercises - Lower Extremity Heel Slides: PROM, Both, 10 reps, Supine Hip ABduction/ADduction: PROM, Both, 10 reps, Supine   Assessment/Plan    PT Assessment Patient needs continued PT services  PT Problem List Decreased strength;Decreased coordination;Decreased range of motion;Decreased cognition;Cardiopulmonary status limiting activity;Decreased activity tolerance;Decreased balance;Decreased mobility;Decreased knowledge of precautions;Decreased safety awareness       PT Treatment Interventions Functional mobility training;Therapeutic activities;Patient/family education;Cognitive remediation;Neuromuscular re-education;Balance training;DME instruction;Therapeutic exercise    PT Goals (Current goals can be found in the Care Plan section)  Acute Rehab PT Goals PT Goal Formulation: Patient unable to participate in goal setting Time For Goal Achievement: 04/26/23 Potential to Achieve Goals: Poor    Frequency Min 1X/week     Co-evaluation               AM-PAC PT "6 Clicks" Mobility  Outcome Measure Help needed turning from your back to your side while in a flat bed without using bedrails?: Total Help needed moving from lying on your back to sitting on the side of a flat bed without using bedrails?: Total Help needed moving to and from a bed to a chair (including a wheelchair)?: Total Help needed standing up from a chair using your arms (e.g., wheelchair or bedside chair)?: Total Help needed to walk in hospital room?: Total Help needed climbing 3-5 steps with a railing? : Total 6 Click Score: 6    End of Session   Activity Tolerance: Patient limited by lethargy Patient left: in bed;with call bell/phone within reach Nurse Communication: Mobility  status;Need for lift equipment PT Visit Diagnosis: Other abnormalities of gait and mobility (R26.89);Difficulty in walking, not elsewhere classified (R26.2);Muscle weakness (generalized) (M62.81)    Time: 4098-1191 PT Time Calculation (min) (ACUTE ONLY): 12 min   Charges:   PT Evaluation $PT Eval High Complexity: 1 High   PT General Charges $$ ACUTE PT VISIT: 1 Visit         Merryl Hacker, PT Acute Rehabilitation Services Office: [phone]   [name]  04/12/2023, 12:56 PM

## 2023-04-12 NOTE — Progress Notes (Addendum)
[facility] Progress Note  Subjective: no UOP yesterday, I/O even. HD early am w/ 2.5 L removed.   Vitals:   04/12/23 0730 04/12/23 0736 04/12/23 0753 04/12/23 0800  BP: (!) 146/76   (!) 148/78  Pulse: (!) 106  (!) 110 (!) 106  Resp: (!) 24  (!) 25 (!) 24  Temp:  98.4 F (36.9 C)    TempSrc:  Oral    SpO2: 97%  98% 99%  Weight:      Height:        Exam: General: ill-looking adult WF, extubated, trach in place, on vent Heart:RRR, s1s2 nl Lungs: Coarse breath sound bilateral Abdomen:soft, non-distended Extremities: 1-2+ bilat UE/ LE edema Dialysis Access: Right IJ temp cath in place     UA 7/16 - mod Hb, ket 5, 100 prot, rare bact, 11-20rbc, 21-50 wbc, 0-5 epi   CT abd 7/17 - normal appearing kidneys w/o hydronephrosis    Assessment/ Plan: Acute kidney injury - b/l creat 1.0 from jan 2023. Creat here was 1.6 on admission in setting of pneumonia, hypotension and septic shock. Creat peaked at 1.7 and then returned to 1.0 on 7/12. Renal failure then worsened again up to 3.84 and CRRT started 7/19. AKI felt to be related to hypotension and IV contrast +/- diuretics. Pt remains oliguric. Two day CRRT holiday showed min UOP. Pt tx'd to [facility] for iHD. HD yesterday w/ 2.5 L off. May do HD tonight if staffing allows.   Acute hypoxic respiratory failure - due to COPD/ pna, on vent w/ trach per CCM. F/u CXR 7/23 was negative for edema.   Sepsis - initial dx on admission w/ pna and septic shock. Has been off pressors for 2-3 days, bp's stable.  Volume ^/lower extremity edema - edema remains 2+. Presented at 92kg on admission w/ peak wt 95kg and then nadir 83kg w/ CRRT. 2.5 L off w/ HD overnight.  Anemia - Hb 7- 9 range, tranfuse prn COPD Anxiety/ depression   Vinson Moselle MD  [facility] 04/12/2023, 12:32 PM  Recent Labs  Lab 04/11/23 0804 04/12/23 0427  HGB 7.8* 7.4*  ALBUMIN 2.0* 2.2*  CALCIUM 8.7* 8.4*  PHOS 5.5*  5.5* 3.1  CREATININE 4.37* 2.34*  K 5.2* 3.4*   No  results for input(s): "IRON", "TIBC", "FERRITIN" in the last 168 hours. Inpatient medications:  ALPRAZolam  1 mg Per Tube BID   arformoterol  15 mcg Nebulization BID   aspirin  81 mg Per Tube Daily   atorvastatin  20 mg Per Tube Daily   budesonide (PULMICORT) nebulizer solution  0.5 mg Nebulization BID   Chlorhexidine Gluconate Cloth  6 each Topical Q0600   docusate  100 mg Per Tube BID   escitalopram  10 mg Per Tube Daily   feeding supplement (PIVOT 1.5 CAL)  1,000 mL Per Tube Q24H   Gerhardt's butt cream   Topical BID   heparin injection (subcutaneous)  5,000 Units Subcutaneous Q8H   insulin aspart  0-6 Units Subcutaneous Q4H   metoCLOPramide  5 mg Per Tube Q8H   multivitamin  1 tablet Per Tube QHS   mouth rinse  15 mL Mouth Rinse Q2H   pantoprazole (PROTONIX) IV  40 mg Intravenous Q12H   polyethylene glycol  17 g Per NG tube Daily   QUEtiapine  25 mg Per Tube QHS   revefenacin  175 mcg Nebulization Daily   sodium chloride flush  10-40 mL Intracatheter Q12H    sodium chloride 10 mL/hr  at 04/12/23 0800   ondansetron [facility]) IV Stopped (04/11/23 5784)   piperacillin-tazobactam (ZOSYN)  IV Stopped (04/12/23 0539)   acetaminophen (TYLENOL) oral liquid 160 mg/5 mL, albuterol, bisacodyl, fentaNYL (SUBLIMAZE) injection, fentaNYL (SUBLIMAZE) injection, hydrALAZINE, lip balm, midazolam, ondansetron (ZOFRAN) IV, mouth rinse, oxyCODONE, phenol, pneumococcal 20-valent conjugate vaccine, polyvinyl alcohol, sodium chloride flush

## 2023-04-12 NOTE — Progress Notes (Signed)
NAME:  Tamara Castro, MRN:  [id number], DOB:  [birth year], LOS: 11 ADMISSION DATE:  04/01/2023, CONSULTATION DATE:  04/01/2023 REFERRING MD:  Salvadore Dom, CHIEF COMPLAINT:  Respiratory Failure   History of Present Illness:  HPI obtained from EMR review as patient is intubated and sedated on MV.    58 yoF with PMH as below who presented to [facility] on 7/5 after 1 week hx of URI symptoms admitted for acute hypoxic and hypercarbic respiratory failure 2/2 RLL PNA requiring intubation, septic shock, AKI and acute heart failure.  Found to have pBNP of 49k with elevated troponin's thought secondary to demand ischemia with echo with normal EF, reduced RV, indeterminate diastolic function.  CTA neg for PE 7/5.  CTH neg 7/9.  Covered with azithromycin/ ceftriaxone, switched to cefepime on 7/7.  Shock and AKI improved and extubated 7/7 but required BiPAP.  Required reintubation 7/10 for respiratory failure.  Increasing WBC 7/13, placed on doxycyline switched to vancomycin with elevated PCT with CXR showing increased bibasilar atelectasis vs infiltrates with increasing pleural effusions.  Initial blood culture x1 w/ stap epi, thought contaminate with repeat BC 7/7 negative.  Some concern for ileus with high OGT residuals with CT a/p showing slightly worse dilation of suspected chronic ileus with prior surgical changes but no transition point, possible emesis of TF since held, remains, NPO on reglan.  Patient has failed SBT for several days, due to apnea despite decreased sedation.  Transferred to [facility] ICU for further care, admitted to PCCM.  Pertinent  Medical History  Tobacco abuse,  COPD, chronic constipation, prior colectomy in 1990's 2/2 diverticulitis, fibromyalgia (on disability since 2008), depression, anxiety, neuropathy   Home meds> xanax (1mg  QID), lexapro, gabapentin, anoro elliptia, zofran  Significant Hospital Events: Including procedures, antibiotic start and stop dates in addition to other pertinent  events   7/5 admitted [facility] CTX and azith started 7/7 extubated. Vanc added and cefepime started; CTX stopped 7/8 vanc stopped 7/10 reintubated  7/16 tx to [facility]. Cefepime stopped 7/17 attempting lasix. Creatinine worse.  Echo EF 60-65%no WMA RV nml  7/18 cr worse after lasix. Weaning but on-going ileus and cr rising presenting obstacle to extubation Trying neostigmine.  7/19 scr worse in spite of holding lasix. Still on low dose precedex. Mental status and renal fxn still barrier to extubation. Nephro consulted > started CRRT.  HD catheter placed. 7/20 [place] heparin stopped d/t bleeding around HD cath. Got DDAVP.  7/21 resumes cefepime for worsening leukocytosis.  7/22 volumes ok w/ SBT but has sig accessory use  7/23- Trach performed at bedside. CRRT holiday per nephrology. Increased Xanax to home dose 1mg  QID- added seroquel 25mg  BID 7/24- has successfully weaned off propofol and converted to precedex infusion. Tolerating 4+ hours of SBT 7/25- discontinued precedex and fentanyl with as needed fentanyl pushes for pain. Tolerated SBT for 3 hours before failing and being placed on full vent support. Oliguric with rising serum creatinine- nephrology requests transfer to [facility] for restarting dialysis  7/26 multiple emesis, NG to LIS, EEG neg , HD session  Interim History / Subjective:   Remains chronic critically ill. RN reports another bout of emesis this morning. Underwent dialysis last night Afebrile  Objective   Blood pressure (!) 148/78, pulse (!) 106, temperature 98.4 F (36.9 C), temperature source Oral, resp. rate (!) 24, height 5\' 5"  (1.651 m), weight 86.4 kg, SpO2 99%.  Output of only 125 mL of urine yesterday.  Afebrile.  Pulse 100s, 110s tracheostomy on  ventilator satting at 92-96% currently on Precedex.    Vent Mode: PRVC FiO2 (%):  [30 %] 30 % Set Rate:  [20 bmp] 20 bmp Vt Set:  [450 mL] 450 mL PEEP:  [5 cmH20] 5 cmH20 Plateau Pressure:  [19 cmH20-21 cmH20] 21 cmH20    Intake/Output Summary (Last 24 hours) at 04/12/2023 0858 Last data filed at 04/12/2023 0800 Gross per 24 hour  Intake 591.57 ml  Output 3050 ml  Net -2458.43 ml   Filed Weights   04/10/23 0500 04/11/23 0500 04/12/23 0500  Weight: 82.3 kg 86.6 kg 86.4 kg    Examination: General: Critically ill, tracheostomy, on vent,  Eyes: Unable to track me across the room  Head: Normocephalic, atraumatic  Cardio: Tachycardic, no murmurs, rubs or gallops Pulmonary: No accessory muscle use, bilateral ventilated breath sounds Abdomen: Soft, with bruising noted throughout, no tenderness, slight distension appreciated, no guarding Neuro: Purposeful to deep pain stimulus, RASS -2 does not follow commands Skin: No rashes noted  Extremities: Bilateral lower and upper extremities with 1-2+ pitting edema   Labs reviewed: CBGs controlled Mild hypokalemia, persistent leukocytosis, stable anemia 7.4  Respiratory culture 1/21 normal flora  Resolved Hospital Problem list   CAD Demand Ischemia Prolonged Qtc Troponin 134--162--101. -continue ASA and statin and continue tele monitoring Hyperphosphatemia Septic shock Thrombocytopenia  Assessment & Plan:   Admitted for community-acquired pneumonia/Moraxella, complicated by ileus and prolonged mechanical ventilation requiring tracheostomy  #Sepsis #Leukocytosis  -Initially treated for Moraxella CAP -Now with persistent leukocytosis, no source identified  -Continue Zosyn day 7 of 7     #Acute hypoxic and hypercarbic respiratory failure secondary to CAP #COPD  -Daily spontaneous breathing trials, he has not tolerated so far -Continue Brovana nebulizers -Continue budesonide nebulizers -Continue Yupelri nebulizers -Monitor for any signs of worsening respiratory distress -VAP bundle (HOB 30 degrees, frequent oral and tracheostomy care)  #Acute encephalopathy #Anxiety ICU delirium versus sedating medications-Xanax and Seroquel -DC  Seroquel -Reduce Xanax to 0.5 twice daily to avoid withdrawal -As needed Versed for agitation -As needed fentanyl -Continue Lexapro 10 mg daily  #Acute kidney injury likely second to ischemic ATN #Hyponatremia  -On intermittent HD  7/26 -Nephrology following -Monitor for urine output -Flush Urinary catheter -Strict I's and O's  #Acute on chronic ileus Initially thought to be resolved, seems to still be complicating picture.  Patient had multiple vomiting episodes this morning.  Hypoactive bowel sounds.  Repeat abdominal imaging showing improving ileus.  -Continue Reglan 5 mg every 8 hours -Continue bowel regimen   #Anemia of critical illness  No signs of bleeding.   -Likely related to critical illness -Continue to monitor -Transfuse if hemoglobin drops below 7  Protein calorie malnutrition, mild to moderate related to critical illness -Tube feeds on hold due to persistent vomiting and ileus. Will restart at trickle once vomiting stops  #Goals of care Pt trached 7/23. Post procedure family states that pt would NOT want to remain on long term ventilator support however are in agreement with LTAC for progressive ventilator weaning as tolerated   Best Practice (right click and "Reselect all SmartList Selections" daily)   Diet/type: NPO w/ oral meds DVT prophylaxis: prophylactic heparin  GI prophylaxis: PPI Lines: Central line, Dialysis Catheter, and yes and it is still needed Foley:  Yes, and it is still needed Code Status:  DNR Last date of multidisciplinary goals of care discussion [04/09/2023]  Labs   CBC: Recent Labs  Lab 04/06/23 0336 04/07/23 0417 04/08/23 0506 04/10/23 0503 04/10/23 1618  04/11/23 0446 04/11/23 0804 04/12/23 0427  WBC 16.5* 15.9*   < > 20.0* 19.5* 16.3* 18.3* 17.0*  NEUTROABS 13.7* 13.1*  --   --   --   --  16.4*  --   HGB 9.5* 8.9*   < > 7.3* 7.8* 7.6* 7.8* 7.4*  HCT 29.1* 27.2*   < > 23.0* 24.7* 23.1* 24.0* 22.8*  MCV 95.7 95.4   < >  98.3 99.6 99.1 98.8 94.2  PLT 85* 86*   < > 109* 122* 141* 170 224   < > = values in this interval not displayed.    Basic Metabolic Panel: Recent Labs  Lab 04/07/23 0417 04/08/23 0506 04/09/23 0451 04/10/23 0503 04/11/23 0446 04/11/23 0804 04/12/23 0427  NA 136 134* 134* 134* 134* 133* 136  K 3.8 3.8 4.1 4.4 5.2* 5.2* 3.4*  CL 105 104 100 103 100 98 96*  CO2 24 24 22 22  20* 19* 25  GLUCOSE 130* 127* 127* 121* 108* 121* 84  BUN 14 11 22  38* 51* 52* 19  CREATININE 1.21* 0.98 1.86* 2.98* 4.17* 4.37* 2.34*  CALCIUM 7.6* 7.6* 8.0* 7.7* 8.4* 8.7* 8.4*  MG 2.4 2.3  --   --  2.5* 2.5* 1.9  PHOS 1.8* 2.1* 4.9* 4.5 5.5* 5.5*  5.5* 3.1   GFR: Estimated Creatinine Clearance: 27.1 mL/min (A) (by C-G formula based on SCr of 2.34 mg/dL (H)). Recent Labs  Lab 04/10/23 1618 04/11/23 0446 04/11/23 0804 04/11/23 0924 04/12/23 0427  PROCALCITON  --   --  1.62  --   --   WBC 19.5* 16.3* 18.3*  --  17.0*  LATICACIDVEN  --   --   --  0.9  --     Liver Function Tests: Recent Labs  Lab 04/06/23 0336 04/06/23 2031 04/08/23 0506 04/09/23 0451 04/10/23 0503 04/11/23 0804 04/12/23 0427  AST 15  --   --   --   --   --   --   ALT 15  --   --   --   --   --   --   ALKPHOS 49  --   --   --   --   --   --   BILITOT 0.7  --   --   --   --   --   --   PROT 5.5*  --   --   --   --   --   --   ALBUMIN 2.5*  2.4*   < > 2.6* 2.3* 2.0* 2.0* 2.2*   < > = values in this interval not displayed.   No results for input(s): "LIPASE", "AMYLASE" in the last 168 hours. No results for input(s): "AMMONIA" in the last 168 hours.  ABG    Component Value Date/Time   PHART 7.26 (L) 04/01/2023 1630   PCO2ART 52 (H) 04/01/2023 1630   PO2ART 106 04/01/2023 1630   HCO3 23.3 04/01/2023 1630   ACIDBASEDEF 4.1 (H) 04/01/2023 1630   O2SAT 74.2 04/03/2023 1146     Coagulation Profile: No results for input(s): "INR", "PROTIME" in the last 168 hours.  Cardiac Enzymes: No results for input(s): "CKTOTAL",  "CKMB", "CKMBINDEX", "TROPONINI" in the last 168 hours.  HbA1C: No results found for: "HGBA1C"  CBG: Recent Labs  Lab 04/11/23 1540 04/11/23 1919 04/11/23 2311 04/12/23 0318 04/12/23 0735  GLUCAP 91 97 90 87 100*      Critical care time: 33 minutes    Cyril Mourning MD. FCCP.  [facility] Pager : [phone]  If no response to pager , please call [phone] until 7 pm After 7:00 pm call Elink  [phone]    04/12/2023 8:58 AM

## 2023-04-12 NOTE — Progress Notes (Signed)
eLink Physician-Brief Progress Note Patient Name: Tamara Castro DOB: August 24, 1961 MRN: [id number]   Date of Service  04/12/2023  HPI/Events of Note  Potassium 3.4, Magnesium 1.9  Cr. 2.34, GFR 23  Patient has a CVC- but is NPO so cannot have anything that isn't IV.   eICU Interventions  K/mag replacement ordered     Intervention Category Intermediate Interventions: Electrolyte abnormality - evaluation and management  Ranee Gosselin 04/12/2023, 5:27 AM

## 2023-04-12 NOTE — Progress Notes (Signed)
ICU, intubated/sedated  Informed consent signed and in chart.   TX duration: 3.25 hours  Patient tolerated well.  Hand-off given to patient's nurse.   Access used: catheter Access issues: none  Total UF removed: 2500 ml Medication(s) given: none Post HD VS: 128/79 Post HD weight: unable to obtain.     04/12/23 0415  Vitals  Temp 98.2 F (36.8 C)  Temp Source Oral  BP 128/79  MAP (mmHg) 94  BP Location Right Arm  BP Method Automatic  Patient Position (if appropriate) Lying  Pulse Rate (!) 115  Pulse Rate Source Monitor  ECG Heart Rate (!) 116  Resp (!) 27  Oxygen Therapy  SpO2 96 %  Post Treatment  Dialyzer Clearance Clear  Duration of HD Treatment -hour(s) 3.25 hour(s)  Hemodialysis Intake (mL) 0 mL  Liters Processed 68.2  Fluid Removed (mL) 2500 mL  Tolerated HD Treatment Yes  Post-Hemodialysis Comments HD tx completed as scheduled, UF goal reached, tolerated well, pt is intubated.  AVG/AVF Arterial Site Held (minutes) 0 minutes  AVG/AVF Venous Site Held (minutes) 0 minutes  Hemodialysis Catheter Right Internal jugular Triple lumen Temporary (Non-Tunneled)  Placement Date/Time: 04/04/23 1750   Placed prior to admission: No  Time Out: Correct patient;Correct site;Correct procedure  Maximum sterile barrier precautions: Hand hygiene;Cap;Mask;Sterile gown;Sterile gloves;Large sterile sheet  Site Prep: Chlorh...  Site Condition No complications  Blue Lumen Status Flushed;Heparin locked;Dead end cap in place  Red Lumen Status Flushed;Heparin locked;Dead end cap in place  Purple Lumen Status N/A  Catheter fill solution Heparin 1000 units/ml  Catheter fill volume (Arterial) 1.2 cc  Catheter fill volume (Venous) 1.2  Dressing Type Transparent  Dressing Status Antimicrobial disc in place  Drainage Description None  Post treatment catheter status Capped and Clamped

## 2023-04-13 ENCOUNTER — Inpatient Hospital Stay (HOSPITAL_COMMUNITY): Payer: 59

## 2023-04-13 DIAGNOSIS — J9601 Acute respiratory failure with hypoxia: Secondary | ICD-10-CM | POA: Diagnosis not present

## 2023-04-13 DIAGNOSIS — H5702 Anisocoria: Secondary | ICD-10-CM

## 2023-04-13 DIAGNOSIS — G9340 Encephalopathy, unspecified: Secondary | ICD-10-CM | POA: Diagnosis not present

## 2023-04-13 DIAGNOSIS — Z930 Tracheostomy status: Secondary | ICD-10-CM | POA: Diagnosis not present

## 2023-04-13 LAB — CBC WITH DIFFERENTIAL/PLATELET
Abs Immature Granulocytes: 0.07 10*3/uL (ref 0.00–0.07)
Basophils Absolute: 0 10*3/uL (ref 0.0–0.1)
Basophils Relative: 0 %
Eosinophils Absolute: 0 10*3/uL (ref 0.0–0.5)
Eosinophils Relative: 0 %
HCT: 21.1 % — ABNORMAL LOW (ref 36.0–46.0)
Hemoglobin: 6.9 g/dL — CL (ref 12.0–15.0)
Immature Granulocytes: 1 %
Lymphocytes Relative: 8 %
Lymphs Abs: 0.8 10*3/uL (ref 0.7–4.0)
MCH: 31.1 pg (ref 26.0–34.0)
MCHC: 32.7 g/dL (ref 30.0–36.0)
MCV: 95 fL (ref 80.0–100.0)
Monocytes Absolute: 1.1 10*3/uL — ABNORMAL HIGH (ref 0.1–1.0)
Monocytes Relative: 11 %
Neutro Abs: 7.7 10*3/uL (ref 1.7–7.7)
Neutrophils Relative %: 80 %
Platelets: 245 10*3/uL (ref 150–400)
RBC: 2.22 MIL/uL — ABNORMAL LOW (ref 3.87–5.11)
RDW: 16.4 % — ABNORMAL HIGH (ref 11.5–15.5)
WBC: 9.7 10*3/uL (ref 4.0–10.5)
nRBC: 0 % (ref 0.0–0.2)

## 2023-04-13 LAB — GLUCOSE, CAPILLARY
Glucose-Capillary: 108 mg/dL — ABNORMAL HIGH (ref 70–99)
Glucose-Capillary: 119 mg/dL — ABNORMAL HIGH (ref 70–99)
Glucose-Capillary: 126 mg/dL — ABNORMAL HIGH (ref 70–99)
Glucose-Capillary: 66 mg/dL — ABNORMAL LOW (ref 70–99)
Glucose-Capillary: 84 mg/dL (ref 70–99)
Glucose-Capillary: 92 mg/dL (ref 70–99)
Glucose-Capillary: 98 mg/dL (ref 70–99)

## 2023-04-13 LAB — BASIC METABOLIC PANEL WITH GFR
Anion gap: 17 — ABNORMAL HIGH (ref 5–15)
BUN: 34 mg/dL — ABNORMAL HIGH (ref 8–23)
CO2: 23 mmol/L (ref 22–32)
Calcium: 8.4 mg/dL — ABNORMAL LOW (ref 8.9–10.3)
Chloride: 94 mmol/L — ABNORMAL LOW (ref 98–111)
Creatinine, Ser: 4.14 mg/dL — ABNORMAL HIGH (ref 0.44–1.00)
GFR, Estimated: 12 mL/min — ABNORMAL LOW (ref >60)
Glucose, Bld: 86 mg/dL (ref 70–99)
Potassium: 3.9 mmol/L (ref 3.5–5.1)
Sodium: 134 mmol/L — ABNORMAL LOW (ref 135–145)

## 2023-04-13 LAB — HEMOGLOBIN AND HEMATOCRIT, BLOOD
HCT: 20.9 % — ABNORMAL LOW (ref 36.0–46.0)
HCT: 23.8 % — ABNORMAL LOW (ref 36.0–46.0)
Hemoglobin: 6.6 g/dL — CL (ref 12.0–15.0)
Hemoglobin: 7.6 g/dL — ABNORMAL LOW (ref 12.0–15.0)

## 2023-04-13 LAB — PREPARE RBC (CROSSMATCH)

## 2023-04-13 MED ORDER — CHLORHEXIDINE GLUCONATE CLOTH 2 % EX PADS
6.0000 | MEDICATED_PAD | Freq: Every day | CUTANEOUS | Status: DC
  Administered 2023-04-15 – 2023-04-25 (×10): 6 via TOPICAL

## 2023-04-13 MED ORDER — PIVOT 1.5 CAL PO LIQD
1000.0000 mL | ORAL | Status: DC
  Administered 2023-04-13 – 2023-04-14 (×4): 1000 mL
  Filled 2023-04-13 (×3): qty 1000

## 2023-04-13 MED ORDER — DEXTROSE 50 % IV SOLN
INTRAVENOUS | Status: AC
  Filled 2023-04-13: qty 50

## 2023-04-13 MED ORDER — ALPRAZOLAM 0.5 MG PO TABS
0.2500 mg | ORAL_TABLET | Freq: Two times a day (BID) | ORAL | Status: DC
  Administered 2023-04-13 – 2023-04-14 (×4): 0.25 mg
  Filled 2023-04-13 (×4): qty 1

## 2023-04-13 MED ORDER — SODIUM CHLORIDE 0.9% IV SOLUTION: Freq: Once | INTRAVENOUS | Status: AC

## 2023-04-13 MED ORDER — PANTOPRAZOLE SODIUM 40 MG IV SOLR
40.0000 mg | INTRAVENOUS | Status: DC
  Administered 2023-04-13 – 2023-04-27 (×15): 40 mg via INTRAVENOUS
  Filled 2023-04-13 (×15): qty 10

## 2023-04-13 MED ORDER — DEXTROSE 50 % IV SOLN
12.5000 g | INTRAVENOUS | Status: AC
  Administered 2023-04-13: 12.5 g via INTRAVENOUS

## 2023-04-13 NOTE — Progress Notes (Signed)
eLink Physician-Brief Progress Note Patient Name: Tamara Castro DOB: 1960/12/03 MRN: [id number]   Date of Service  04/13/2023  HPI/Events of Note  Bedside nurse reports new anisocoria and right-sided weakness.  eICU Interventions  Patient evaluated on camera in her room.  Code stroke initiated.  CT head pending.     Intervention Category Evaluation Type: Other  Carilyn Goodpasture 04/13/2023, 4:05 AM

## 2023-04-13 NOTE — Progress Notes (Signed)
Hypoglycemic Event  CBG: 66  Treatment: D50 25 mL (12.5 gm)  Symptoms: None  Follow-up CBG: Time:835 CBG Result:119  Possible Reasons for Event: Inadequate meal intake  Comments/MD notified:Dr. Vassie Loll aware and tube feeds to be restarted     , A

## 2023-04-13 NOTE — Plan of Care (Signed)
  Problem: Education: Goal: Knowledge of General Education information will improve Description: Including pain rating scale, medication(s)/side effects and non-pharmacologic comfort measures Outcome: Progressing   Problem: Clinical Measurements: Goal: Ability to maintain clinical measurements within normal limits will improve Outcome: Progressing Goal: Diagnostic test results will improve Outcome: Progressing   Problem: Clinical Measurements: Goal: Respiratory complications will improve Outcome: Not Progressing   Problem: Activity: Goal: Risk for activity intolerance will decrease Outcome: Not Progressing

## 2023-04-13 NOTE — Progress Notes (Signed)
NAME:  Tamara Castro, MRN:  [id number], DOB:  Nov 27, 1960, LOS: 12 ADMISSION DATE:  04/01/2023, CONSULTATION DATE:  04/01/2023 REFERRING MD:  Salvadore Dom, CHIEF COMPLAINT:  Respiratory Failure   History of Present Illness:  HPI obtained from EMR review as patient is intubated and sedated on MV.    77 yoF with PMH as below who presented to [facility] on 7/5 after 1 week hx of URI symptoms admitted for acute hypoxic and hypercarbic respiratory failure 2/2 RLL PNA requiring intubation, septic shock, AKI and acute heart failure.  Found to have pBNP of 49k with elevated troponin's thought secondary to demand ischemia with echo with normal EF, reduced RV, indeterminate diastolic function.  CTA neg for PE 7/5.  CTH neg 7/9.  Covered with azithromycin/ ceftriaxone, switched to cefepime on 7/7.  Shock and AKI improved and extubated 7/7 but required BiPAP.  Required reintubation 7/10 for respiratory failure.  Increasing WBC 7/13, placed on doxycyline switched to vancomycin with elevated PCT with CXR showing increased bibasilar atelectasis vs infiltrates with increasing pleural effusions.  Initial blood culture x1 w/ stap epi, thought contaminate with repeat BC 7/7 negative.  Some concern for ileus with high OGT residuals with CT a/p showing slightly worse dilation of suspected chronic ileus with prior surgical changes but no transition point, possible emesis of TF since held, remains, NPO on reglan.  Patient has failed SBT for several days, due to apnea despite decreased sedation.  Transferred to [facility] ICU for further care, admitted to PCCM.  Pertinent  Medical History  Tobacco abuse,  COPD, chronic constipation, prior colectomy in 1990's 2/2 diverticulitis, fibromyalgia (on disability since 2008), depression, anxiety, neuropathy   Home meds> xanax (1mg  QID), lexapro, gabapentin, anoro elliptia, zofran  Significant Hospital Events: Including procedures, antibiotic start and stop dates in addition to other pertinent  events   7/5 admitted [facility] CTX and azith started 7/7 extubated. Vanc added and cefepime started; CTX stopped 7/8 vanc stopped 7/10 reintubated  7/16 tx to [facility]. Cefepime stopped 7/17 attempting lasix. Creatinine worse.  Echo EF 60-65%no WMA RV nml  7/18 cr worse after lasix. Weaning but on-going ileus and cr rising presenting obstacle to extubation Trying neostigmine.  7/19 scr worse in spite of holding lasix. Still on low dose precedex. Mental status and renal fxn still barrier to extubation. Nephro consulted > started CRRT.  HD catheter placed. 7/20 [place] heparin stopped d/t bleeding around HD cath. Got DDAVP.  7/21 resumes cefepime for worsening leukocytosis.  7/22 volumes ok w/ SBT but has sig accessory use  7/23- Trach performed at bedside. CRRT holiday per nephrology. Increased Xanax to home dose 1mg  QID- added seroquel 25mg  BID 7/24- has successfully weaned off propofol and converted to precedex infusion. Tolerating 4+ hours of SBT 7/25- discontinued precedex and fentanyl with as needed fentanyl pushes for pain. Tolerated SBT for 3 hours before failing and being placed on full vent support. Oliguric with rising serum creatinine- nephrology requests transfer to [facility] for restarting dialysis  7/26 multiple emesis, NG to LIS, EEG neg , HD session 7/27 DC Seroquel, decrease Xanax 0.5 twice daily  Interim History / Subjective:   Remains chronic critically ill. Emesis test output NG to LIS. Code stroke called for unequal pupils this a.m., head CT negative, seen by neurology No urine output Afebrile  Objective   Blood pressure 128/77, pulse 87, temperature 99.8 F (37.7 C), temperature source Oral, resp. rate (!) 23, height 5\' 5"  (1.651 m), weight 79.3 kg, SpO2 100%.  Vent Mode: PRVC FiO2 (%):  [30 %-40 %] 40 % Set Rate:  [20 bmp] 20 bmp Vt Set:  [450 mL] 450 mL PEEP:  [5 cmH20] 5 cmH20 Plateau Pressure:  [13 cmH20-19 cmH20] 18 cmH20   Intake/Output Summary (Last 24 hours)  at 04/13/2023 4010 Last data filed at 04/13/2023 0600 Gross per 24 hour  Intake 388.62 ml  Output 1135 ml  Net -746.38 ml   Filed Weights   04/11/23 0500 04/12/23 0500 04/13/23 0500  Weight: 86.6 kg 86.4 kg 79.3 kg    Examination: General: Critically ill, tracheostomy, on vent,  Eyes: Opens eyes to name, tracks, right pupil 4 mm, left 3 mm bilateral reactive to light Head: Normocephalic, atraumatic  Cardio: Tachycardic, no murmurs, rubs or gallops Pulmonary: No accessory muscle use, bilateral ventilated breath sounds Abdomen: Soft, with bruising noted throughout, no tenderness, slight distension appreciated, no guarding Neuro: Purposeful to deep pain stimulus, RASS 0 does not follow commands Skin: No rashes noted  Extremities: Bilateral lower and upper extremities with 1-2+ pitting edema   Labs reviewed: CBGs controlled Mild hyponatremia, no leukocytosis, hemoglobin dropped to 6.9  Respiratory culture 1/21 normal flora  Resolved Hospital Problem list   CAD Demand Ischemia Prolonged Qtc Troponin 134--162--101. -continue ASA and statin and continue tele monitoring Hyperphosphatemia Septic shock Thrombocytopenia  Assessment & Plan:   Admitted for community-acquired pneumonia/Moraxella, complicated by ileus and prolonged mechanical ventilation requiring tracheostomy  #Sepsis #Leukocytosis, resolved 7/28  -Initially treated for Moraxella CAP -Completed Zosyn 7 days, observe off antibiotics    #Acute hypoxic and hypercarbic respiratory failure secondary to CAP #COPD  -Daily spontaneous breathing trials, she has not made much progress , expect prolonged wean -Continue Brovana nebulizers -Continue budesonide nebulizers -Continue Yupelri nebulizers -VAP bundle (HOB 30 degrees, frequent oral and tracheostomy care)  #Acute encephalopathy #Anxiety #Unequal pupils, head CT negative ICU delirium versus sedating medications-Xanax and Seroquel -DC Seroquel -Reduce Xanax  to 0.25 twice daily -As needed Versed for agitation -As needed fentanyl -Continue Lexapro 10 mg daily -MRI planned by neuro  #Acute kidney injury likely second to ischemic ATN #Hyponatremia  -On intermittent HD  7/26 -Nephrology following -Monitor for urine output -Flush Urinary catheter   #Acute on chronic ileus Multiple emesis episodes last 2 to 3 days, has loose stools  Repeat abdominal imaging showing improving ileus.  -Continue Reglan 5 mg every 8 hours -Continue bowel regimen   #Anemia of critical illness  No signs of bleeding.   -Likely related to critical illness -Continue to monitor -Transfuse if hemoglobin drops below 7, 1 unit PRBC today  Protein calorie malnutrition, mild to moderate related to critical illness -Restart trickle feeds  Hypoglycemia -continue CBG checks with DC SSI. Will need to add D10 if unable to tolerate tube feeds  #Goals of care Pt trached 7/23.  Per family pt would NOT want to remain on long term ventilator support however are in agreement with LTAC for progressive ventilator weaning as tolerated   Best Practice (right click and "Reselect all SmartList Selections" daily)   Diet/type: NPO w/ oral meds DVT prophylaxis: prophylactic heparin  GI prophylaxis: PPI Lines: Central line, Dialysis Catheter, and yes and it is still needed Foley:  Yes, and it is still needed Code Status:  DNR Last date of multidisciplinary goals of care discussion [04/09/2023]-planning for LTAC  Labs   CBC: Recent Labs  Lab 04/07/23 0417 04/08/23 0506 04/10/23 1618 04/11/23 0446 04/11/23 0804 04/12/23 0427 04/13/23 0311 04/13/23 0433  WBC 15.9*   < >  19.5* 16.3* 18.3* 17.0* 9.7  --   NEUTROABS 13.1*  --   --   --  16.4*  --  7.7  --   HGB 8.9*   < > 7.8* 7.6* 7.8* 7.4* 6.9* 6.6*  HCT 27.2*   < > 24.7* 23.1* 24.0* 22.8* 21.1* 20.9*  MCV 95.4   < > 99.6 99.1 98.8 94.2 95.0  --   PLT 86*   < > 122* 141* 170 224 245  --    < > = values in this interval  not displayed.    Basic Metabolic Panel: Recent Labs  Lab 04/07/23 0417 04/08/23 0506 04/09/23 0451 04/10/23 0503 04/11/23 0446 04/11/23 0804 04/12/23 0427 04/13/23 0311  NA 136 134* 134* 134* 134* 133* 136 134*  K 3.8 3.8 4.1 4.4 5.2* 5.2* 3.4* 3.9  CL 105 104 100 103 100 98 96* 94*  CO2 24 24 22 22  20* 19* 25 23  GLUCOSE 130* 127* 127* 121* 108* 121* 84 86  BUN 14 11 22  38* 51* 52* 19 34*  CREATININE 1.21* 0.98 1.86* 2.98* 4.17* 4.37* 2.34* 4.14*  CALCIUM 7.6* 7.6* 8.0* 7.7* 8.4* 8.7* 8.4* 8.4*  MG 2.4 2.3  --   --  2.5* 2.5* 1.9  --   PHOS 1.8* 2.1* 4.9* 4.5 5.5* 5.5*  5.5* 3.1  --    GFR: Estimated Creatinine Clearance: 14.7 mL/min (A) (by C-G formula based on SCr of 4.14 mg/dL (H)). Recent Labs  Lab 04/11/23 0446 04/11/23 0804 04/11/23 0924 04/12/23 0427 04/13/23 0311  PROCALCITON  --  1.62  --   --   --   WBC 16.3* 18.3*  --  17.0* 9.7  LATICACIDVEN  --   --  0.9  --   --     Liver Function Tests: Recent Labs  Lab 04/08/23 0506 04/09/23 0451 04/10/23 0503 04/11/23 0804 04/12/23 0427  ALBUMIN 2.6* 2.3* 2.0* 2.0* 2.2*   No results for input(s): "LIPASE", "AMYLASE" in the last 168 hours. No results for input(s): "AMMONIA" in the last 168 hours.  ABG    Component Value Date/Time   PHART 7.26 (L) 04/01/2023 1630   PCO2ART 52 (H) 04/01/2023 1630   PO2ART 106 04/01/2023 1630   HCO3 23.3 04/01/2023 1630   ACIDBASEDEF 4.1 (H) 04/01/2023 1630   O2SAT 74.2 04/03/2023 1146     Coagulation Profile: No results for input(s): "INR", "PROTIME" in the last 168 hours.  Cardiac Enzymes: No results for input(s): "CKTOTAL", "CKMB", "CKMBINDEX", "TROPONINI" in the last 168 hours.  HbA1C: No results found for: "HGBA1C"  CBG: Recent Labs  Lab 04/12/23 1526 04/12/23 1928 04/12/23 2305 04/13/23 0309 04/13/23 0746  GLUCAP 88 81 89 84 66*      Critical care time: 34 minutes    Cyril Mourning MD. FCCP. [facility] Pager : [phone]  If no response to pager , please call [phone] until 7 pm After 7:00 pm call Elink  [phone]    04/13/2023 8:39 AM

## 2023-04-13 NOTE — Code Documentation (Signed)
Responded to Code Stroke called at 0330 for unequal pupils, LSN-0000. CBG-84, NIH-21, CT head negative for acute changes. TNK not given-recent trach and anemia.  Plan MRI, VS/neuro check q2h x 12, then q4h or dept routine if more often.

## 2023-04-13 NOTE — Progress Notes (Signed)
Patient transported to MRI and back without complications. RN at bedside. 

## 2023-04-13 NOTE — Plan of Care (Signed)
MRI neg.   Neurology will sign off. See initial consult note for details.

## 2023-04-13 NOTE — Progress Notes (Signed)
[facility] Progress Note  Subjective: I/O - 2.9 yest and +2.9 today. No UOP this weekend. BP's have been good 120-140s sbp.   Vitals:   04/13/23 1123 04/13/23 1138 04/13/23 1145 04/13/23 1313  BP: 129/73 127/68  138/79  Pulse: 79 85  80  Resp: (!) 24 20  (!) 25  Temp: 99.7 F (37.6 C) 99.7 F (37.6 C) 99.7 F (37.6 C) 98.3 F (36.8 C)  TempSrc: Oral  Oral Axillary  SpO2: 100%   100%  Weight:      Height:        Exam: General: ill-looking adult WF, extubated, trach in place, on vent Heart:RRR, s1s2 nl Lungs: Coarse breath sound bilateral Abdomen:soft, non-distended Extremities: 1-2+ bilat UE/ LE edema Dialysis Access: Right IJ temp cath in place     UA 7/16 - mod Hb, ket 5, 100 prot, rare bact, 11-20rbc, 21-50 wbc, 0-5 epi   CT abd 7/17 - normal appearing kidneys w/o hydronephrosis  Assessment/ Plan: Acute kidney injury - b/l creat 1.0 from jan 2023. Creat here was 1.6 on admission in setting of pneumonia, hypotension and septic shock. Creat peaked at 1.7 and then returned to 1.0 on 7/12. Renal failure then worsened again up to 3.84 and CRRT started 7/19. AKI felt to be related to hypotension and IV contrast +/- diuretics. Has a two day CRRT holiday which showed min UOP. Pt tx'd to [facility] for iHD. Had iHD Friday w/ 2.5 L off. Plan HD tomorrow w/ same UF goal. We are hopefully awaiting renal recovery. Sepsis/ shock/ hypotension - initial dx on admission was pna and septic shock, now resolved. BP's have been much better, last pressors were on 7/24.  Acute hypoxic respiratory failure - due to COPD/ pna, on vent w/ trach per CCM. F/u CXR 7/23 was negative for edema.  Volume excess/ lower extremity edema - edema remains 2+, we got 2.5 L off w/ HD friday. Cont to lower vol as tolerated w/ HD.  Anemia - Hb 7- 9 range, tranfuse prn.  COPD Anxiety/ depression   Tamara Moselle MD  [facility] 04/13/2023, 12:32 PM  Recent Labs  Lab 04/11/23 0804 04/12/23 0427 04/13/23 0311  04/13/23 0433  HGB 7.8* 7.4* 6.9* 6.6*  ALBUMIN 2.0* 2.2*  --   --   CALCIUM 8.7* 8.4* 8.4*  --   PHOS 5.5*  5.5* 3.1  --   --   CREATININE 4.37* 2.34* 4.14*  --   K 5.2* 3.4* 3.9  --    No results for input(s): "IRON", "TIBC", "FERRITIN" in the last 168 hours. Inpatient medications:  ALPRAZolam  0.25 mg Per Tube BID   arformoterol  15 mcg Nebulization BID   aspirin  81 mg Per Tube Daily   atorvastatin  20 mg Per Tube Daily   budesonide (PULMICORT) nebulizer solution  0.5 mg Nebulization BID   Chlorhexidine Gluconate Cloth  6 each Topical Q0600   docusate  100 mg Per Tube BID   escitalopram  10 mg Per Tube Daily   feeding supplement (PIVOT 1.5 CAL)  1,000 mL Per Tube Q24H   Gerhardt's butt cream   Topical BID   heparin injection (subcutaneous)  5,000 Units Subcutaneous Q8H   metoCLOPramide (REGLAN) injection  5 mg Intravenous Q8H   multivitamin  1 tablet Per Tube QHS   mouth rinse  15 mL Mouth Rinse Q2H   pantoprazole (PROTONIX) IV  40 mg Intravenous Q24H   polyethylene glycol  17 g Per NG tube Daily  revefenacin  175 mcg Nebulization Daily   sodium chloride flush  10-40 mL Intracatheter Q12H    sodium chloride Stopped (04/13/23 0842)   ondansetron (ZOFRAN) IV Stopped (04/12/23 0920)   acetaminophen (TYLENOL) oral liquid 160 mg/5 mL, albuterol, bisacodyl, fentaNYL (SUBLIMAZE) injection, fentaNYL (SUBLIMAZE) injection, hydrALAZINE, lip balm, midazolam, ondansetron (ZOFRAN) IV, mouth rinse, oxyCODONE, phenol, pneumococcal 20-valent conjugate vaccine, polyvinyl alcohol, sodium chloride flush

## 2023-04-13 NOTE — Progress Notes (Signed)
OT Cancellation Note  Patient Details Name: Tamara Castro MRN: [id number] DOB: November 12, 1960   Cancelled Treatment:    Reason Eval/Treat Not Completed: Patient not medically ready;Fatigue/lethargy limiting ability to participate;Medical issues which prohibited therapy (Recent code stroke, awaiting MRI. Per RN, pt not following any commands. OT to f/u when pt is able to participate.)  Donia Pounds 04/13/2023, 1:29 PM

## 2023-04-13 NOTE — Progress Notes (Addendum)
eLink Physician-Brief Progress Note Patient Name: IRAN BLACKNER DOB: 12-31-1960 MRN: [id number]   Date of Service  04/13/2023  HPI/Events of Note  Hemoglobin on a.m. lab was 6.9.  Hemoglobin from yesterday was 7.4.  eICU Interventions  Repeat H&H and if hemoglobin is still under 7, will transfuse 1 unit of packed red cells.     Intervention Category Minor Interventions: Other:  Carilyn Goodpasture 04/13/2023, 4:22 AM  Addendum: Repeat hemoglobin is 6 0.6 g/dL.  Transfuse 1 unit of packed red cells.    5:18 AM

## 2023-04-13 NOTE — Consult Note (Signed)
Neurology Consultation Reason for Consult: Code stroke anisocoria  Requesting Physician: Dr Vassie Loll  CC: Unequal pupils  History is obtained from: Chart review and bedside team  HPI: Tamara Castro is a 62 y.o. female with a past medical history significant for COPD, ongoing tobacco use, colectomy secondary to diverticulitis (1990s), fibromyalgia on disability, anxiety/depression, neuropathy  Today there was concern for new onset anisocoria for which code stroke was activated  Per chart review she presented to [facility] on 7/5 with URI symptoms and was admitted for hypoxic and hypercarbic respiratory failure due to a right lower lobe pneumonia complicated by septic shock AKI and acute heart failure for which she required intubation.  Her shock and AKI improved but she continued to require BiPAP after extubation on 7/7 and then was reintubated 7/10, with atelectasis versus infiltrates and increasing pleural effusions on chest x-ray as well as some concern for ileus.  She was started on CRRT 7/19 and tracheostomy was performed on 7/23.  Her mental status has remained poor.  LKW: midnight Thrombolytic given?: No, borderline anemia, recent tracheostomy IA performed?: No, exam not consistent with new LVO Premorbid modified rankin scale:      5 - Severe disability. Requires constant nursing care and attention, bedridden, incontinent. (Prolonged admission, new tracheostomy this admission)  ROS: Unable to obtain due to altered mental status.   Past Medical History:  Diagnosis Date   Anxiety    Asthma    Bladder polyps    per pt, urethral polyps   COPD (chronic obstructive pulmonary disease) (HCC)    Dysplasia of cervix    Hyperlipidemia    Osteoarthritis    Past Surgical History:  Procedure Laterality Date   ABDOMINAL HYSTERECTOMY     CHOLECYSTECTOMY     TONSILLECTOMY Bilateral    TOTAL COLECTOMY  1998   Current Outpatient Medications  Medication Instructions   albuterol  (ACCUNEB) 0.63 MG/3ML nebulizer solution 1 ampule, Nebulization, Every 4 hours PRN   ALPRAZolam (XANAX) 1 mg, Oral, 4 times daily   ANORO ELLIPTA 62.5-25 MCG/INH AEPB 1 puff, Inhalation, Daily   diphenhydrAMINE HCl, Sleep, (ZZZQUIL PO) 2 tablets, Oral, Nightly, Pt takes 2 gummies every night   escitalopram (LEXAPRO) 10 mg, Oral, 2 times daily   gabapentin (NEURONTIN) 800 mg, Oral, 3 times daily PRN   ondansetron (ZOFRAN) 4 mg, Oral, Every 8 hours PRN   PROAIR HFA 108 (90 Base) MCG/ACT inhaler 2 puffs, Inhalation, 4 times daily    Current Facility-Administered Medications:    0.9 %  sodium chloride infusion, , Intravenous, Continuous, Sood, Vineet, MD, Last Rate: 10 mL/hr at 04/13/23 0200, Infusion Verify at 04/13/23 0200   acetaminophen (TYLENOL) 160 MG/5ML solution 650 mg, 650 mg, Per Tube, Q4H PRN, Selmer Dominion B, NP   albuterol (PROVENTIL) (2.5 MG/3ML) 0.083% nebulizer solution 2.5 mg, 2.5 mg, Nebulization, Q4H PRN, Coralyn Helling, MD   ALPRAZolam Prudy Feeler) tablet 0.5 mg, 0.5 mg, Per Tube, BID, Cyril Mourning V, MD, 0.5 mg at 04/12/23 2123   arformoterol (BROVANA) nebulizer solution 15 mcg, 15 mcg, Nebulization, BID, Selmer Dominion B, NP, 15 mcg at 04/12/23 1948   aspirin chewable tablet 81 mg, 81 mg, Per Tube, Daily, [name], NP, 81 mg at 04/12/23 1021   atorvastatin (LIPITOR) tablet 20 mg, 20 mg, Per Tube, Daily, Karie Fetch P, DO, 20 mg at 04/12/23 1021   bisacodyl (DULCOLAX) suppository 10 mg, 10 mg, Rectal, Daily PRN, Paliwal, Aditya, MD   budesonide (PULMICORT) nebulizer solution 0.5 mg,  0.5 mg, Nebulization, BID, Selmer Dominion B, NP, 0.5 mg at 04/12/23 1948   Chlorhexidine Gluconate Cloth 2 % PADS 6 each, 6 each, Topical, Q0600, Delano Metz, MD, 6 each at 04/12/23 1045   docusate (COLACE) 50 MG/5ML liquid 100 mg, 100 mg, Per Tube, BID, Selmer Dominion B, NP, 100 mg at 04/12/23 2123   escitalopram (LEXAPRO) tablet 10 mg, 10 mg, Per Tube, Daily, [name], NP, 10 mg at  04/12/23 1021   feeding supplement (PIVOT 1.5 CAL) liquid 1,000 mL, 1,000 mL, Per Tube, Q24H, [name], NP, Infusion Verify at 04/11/23 0700   fentaNYL (SUBLIMAZE) injection 50 mcg, 50 mcg, Intravenous, Q15 min PRN, [name], NP, 50 mcg at 04/10/23 1425   fentaNYL (SUBLIMAZE) injection 50-100 mcg, 50-100 mcg, Intravenous, Q2H PRN, Oretha Milch, MD, 100 mcg at 04/12/23 0123   Gerhardt's butt cream, , Topical, BID, Paliwal, Eliezer Lofts, MD, Given at 04/12/23 2132   heparin injection 5,000 Units, 5,000 Units, Subcutaneous, Q8H, [name], NP, 5,000 Units at 04/12/23 2124   hydrALAZINE (APRESOLINE) injection 10 mg, 10 mg, Intravenous, Q4H PRN, Paliwal, Aditya, MD, 10 mg at 04/11/23 0655   insulin aspart (novoLOG) injection 0-6 Units, 0-6 Units, Subcutaneous, Q4H, Selmer Dominion B, NP, 1 Units at 04/08/23 1204   lip balm (CARMEX) ointment 1 Application, 1 Application, Topical, PRN, Steffanie Dunn, DO, 1 Application at 04/07/23 2118   metoCLOPramide (REGLAN) injection 5 mg, 5 mg, Intravenous, Q8H, Oretha Milch, MD, 5 mg at 04/12/23 2121   midazolam (VERSED) injection 2 mg, 2 mg, Intravenous, Q1H PRN, Steffanie Dunn, DO, 2 mg at 04/11/23 1809   multivitamin (RENA-VIT) tablet 1 tablet, 1 tablet, Per Tube, QHS, Steffanie Dunn, DO, 1 tablet at 04/12/23 2123   ondansetron (ZOFRAN) 8 mg in sodium chloride 0.9 % 50 mL IVPB, 8 mg, Intravenous, Q6H PRN, Conrad [place], MD, Stopped at 04/12/23 0920   Oral care mouth rinse, 15 mL, Mouth Rinse, Q2H, Sood, Vineet, MD, 15 mL at 04/13/23 0200   Oral care mouth rinse, 15 mL, Mouth Rinse, PRN, Coralyn Helling, MD   oxyCODONE (Oxy IR/ROXICODONE) immediate release tablet 5 mg, 5 mg, Per Tube, Q6H PRN, Bowser, Kaylyn Layer, NP   pantoprazole (PROTONIX) injection 40 mg, 40 mg, Intravenous, Q12H, Selmer Dominion B, NP, 40 mg at 04/12/23 2122   phenol (CHLORASEPTIC) mouth spray 1 spray, 1 spray, Mouth/Throat, PRN, Karie Fetch P, DO   piperacillin-tazobactam  (ZOSYN) IVPB 2.25 g, 2.25 g, Intravenous, Q8H, von Dohlen, Haley B, RPH, Stopped at 04/12/23 2151   pneumococcal 20-valent conjugate vaccine (PREVNAR 20) injection 0.5 mL, 0.5 mL, Intramuscular, Prior to discharge, Karie Fetch P, DO   polyethylene glycol (MIRALAX / GLYCOLAX) packet 17 g, 17 g, Per NG tube, Daily, Paliwal, Aditya, MD, 17 g at 04/12/23 1021   polyvinyl alcohol (LIQUIFILM TEARS) 1.4 % ophthalmic solution 1 drop, 1 drop, Both Eyes, PRN, Chestine Spore, Laura P, DO   revefenacin (YUPELRI) nebulizer solution 175 mcg, 175 mcg, Nebulization, Daily, Sood, Vineet, MD, 175 mcg at 04/12/23 0746   sodium chloride flush (NS) 0.9 % injection 10-40 mL, 10-40 mL, Intracatheter, Q12H, Sood, Vineet, MD, 10 mL at 04/12/23 2124   sodium chloride flush (NS) 0.9 % injection 10-40 mL, 10-40 mL, Intracatheter, PRN, Coralyn Helling, MD, 10 mL at 04/06/23 1052   No family history on file.   Social History:  reports that she has been smoking cigarettes. She has a 40 pack-year smoking history. She  has never used smokeless tobacco. She reports that she does not currently use alcohol. She reports that she does not currently use drugs.  Exam: Current vital signs: BP 136/73   Pulse 93   Temp 99.7 F (37.6 C) (Axillary)   Resp (!) 21   Ht 5\' 5"  (1.651 m)   Wt 86.4 kg   SpO2 100%   BMI 31.70 kg/m  Vital signs in last 24 hours: Temp:  [98.2 F (36.8 C)-99.8 F (37.7 C)] 99.7 F (37.6 C) (07/27 2306) Pulse Rate:  [75-121] 93 (07/28 0200) Resp:  [16-30] 21 (07/28 0200) BP: (106-151)/(67-88) 136/73 (07/28 0200) SpO2:  [94 %-100 %] 100 % (07/28 0354) FiO2 (%):  [30 %] 30 % (07/28 0354) Weight:  [86.4 kg] 86.4 kg (07/27 0500)   Physical Exam  Constitutional: Appears chronically ill  Psych: Minimally interactive Eyes: No scleral injection HENT: No oropharyngeal obstruction.  MSK: no major joint deformities.  Cardiovascular: Perfusing extremities well Respiratory: Effort normal on Trach  GI: Soft.  Skin:  Warm dry and intact visible skin  Neuro: Mental Status: Patient is awake, alert, orients to examiner  Cranial Nerves: II: No blink to threat Pupils are equal, round, and reactive to light with sustained light application but the right eye dilates faster and constricts more slowly III,IV, VI, VIII: EOMI intact by VOR  V: Facial sensation is symmetric to eyelash brush VII: Facial movement is symmetric on grimace VIII: No clear response to voice Motor/sensory: Withdraws in all 4 extremities to light noxious stim equally  Cerebellar: Unable to assess secondary to patient's mental status  Gait:  Unable to assess secondary to patient's mental status   NIHSS total 22 Score breakdown: 2 points for not answering questions, 2 points for not following commands, 3 points for no blink to threat, 3 points for left arm weakness, 3 points for right arm weakness, 3 points for left leg weakness, 3 points for right leg weakness, 3 points for being mute (does not attempt to mouth words), unable to assess dysarthria due to trach Performed at time of patient arrival to CT scanner Slight delay in neurology evaluation due to multiple simultaneous code stroke requiring me to stay in CT scanner to safely manage multiple patients   I have reviewed labs in epic and the results pertinent to this consultation are:  Basic Metabolic Panel: Recent Labs  Lab 04/07/23 0417 04/08/23 0506 04/09/23 0451 04/10/23 0503 04/11/23 0446 04/11/23 0804 04/12/23 0427 04/13/23 0311  NA 136 134* 134* 134* 134* 133* 136 134*  K 3.8 3.8 4.1 4.4 5.2* 5.2* 3.4* 3.9  CL 105 104 100 103 100 98 96* 94*  CO2 24 24 22 22  20* 19* 25 23  GLUCOSE 130* 127* 127* 121* 108* 121* 84 86  BUN 14 11 22  38* 51* 52* 19 34*  CREATININE 1.21* 0.98 1.86* 2.98* 4.17* 4.37* 2.34* 4.14*  CALCIUM 7.6* 7.6* 8.0* 7.7* 8.4* 8.7* 8.4* 8.4*  MG 2.4 2.3  --   --  2.5* 2.5* 1.9  --   PHOS 1.8* 2.1* 4.9* 4.5 5.5* 5.5*  5.5* 3.1  --     CBC: Recent  Labs  Lab 04/07/23 0417 04/08/23 0506 04/10/23 1618 04/11/23 0446 04/11/23 0804 04/12/23 0427 04/13/23 0311  WBC 15.9*   < > 19.5* 16.3* 18.3* 17.0* 9.7  NEUTROABS 13.1*  --   --   --  16.4*  --  7.7  HGB 8.9*   < > 7.8* 7.6* 7.8* 7.4* 6.9*  HCT 27.2*   < > 24.7* 23.1* 24.0* 22.8* 21.1*  MCV 95.4   < > 99.6 99.1 98.8 94.2 95.0  PLT 86*   < > 122* 141* 170 224 245   < > = values in this interval not displayed.    Coagulation Studies: No results for input(s): "LABPROT", "INR" in the last 72 hours.   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  Patient is not a TNK candidate due to recent tracheostomy.  Not an IA candidate due to low likelihood of acute LVO and limited benefit from thrombectomy given her baseline functional status.  However I do think given her persistent altered mental status and prolonged hospital stay MRI brain is reasonable to exclude intracranial process  Recommendations: -MRI brain without contrast when stable; if patient cannot get MRI brain please obtain repeat head CT at 24 hours -Neurology will follow-up MRI brain but otherwise will sign off and will be available as needed going forward  Brooke Dare MD-PhD [facility] [phone] Available 7 PM to 7 AM, outside of these hours please call Neurologist on call as listed on Amion.   Total critical care time: 30 minutes   Critical care time was exclusive of separately billable procedures and treating other patients.   Critical care was necessary to treat or prevent imminent or life-threatening deterioration.  Emergent evaluation for consideration of thrombolytic or thrombectomy due to acute neurologic change   Critical care was time spent personally by me on the following activities:  development of treatment plan with patient and/or surrogate as well as nursing, discussions with consultants/primary team, evaluation of patient's response to treatment, examination of patient, obtaining history from patient or surrogate, ordering and performing treatments and interventions, ordering and review of laboratory studies, ordering and review of radiographic studies, and re-evaluation of patient's condition as needed, as documented above.

## 2023-04-14 DIAGNOSIS — J9601 Acute respiratory failure with hypoxia: Secondary | ICD-10-CM | POA: Diagnosis not present

## 2023-04-14 DIAGNOSIS — N179 Acute kidney failure, unspecified: Secondary | ICD-10-CM | POA: Diagnosis not present

## 2023-04-14 DIAGNOSIS — Z930 Tracheostomy status: Secondary | ICD-10-CM | POA: Diagnosis not present

## 2023-04-14 LAB — GLUCOSE, CAPILLARY
Glucose-Capillary: 133 mg/dL — ABNORMAL HIGH (ref 70–99)
Glucose-Capillary: 136 mg/dL — ABNORMAL HIGH (ref 70–99)
Glucose-Capillary: 154 mg/dL — ABNORMAL HIGH (ref 70–99)
Glucose-Capillary: 155 mg/dL — ABNORMAL HIGH (ref 70–99)
Glucose-Capillary: 156 mg/dL — ABNORMAL HIGH (ref 70–99)
Glucose-Capillary: 161 mg/dL — ABNORMAL HIGH (ref 70–99)

## 2023-04-14 MED ORDER — HEPARIN SODIUM (PORCINE) 1000 UNIT/ML IJ SOLN
INTRAMUSCULAR | Status: AC
  Administered 2023-04-14: 2500 [IU]
  Filled 2023-04-14: qty 3

## 2023-04-14 MED ORDER — INSULIN ASPART 100 UNIT/ML IJ SOLN
0.0000 [IU] | INTRAMUSCULAR | Status: DC
  Administered 2023-04-14: 1 [IU] via SUBCUTANEOUS
  Administered 2023-04-14 (×2): 2 [IU] via SUBCUTANEOUS
  Administered 2023-04-15: 1 [IU] via SUBCUTANEOUS
  Administered 2023-04-15: 2 [IU] via SUBCUTANEOUS
  Administered 2023-04-15 – 2023-04-20 (×18): 1 [IU] via SUBCUTANEOUS
  Administered 2023-04-20: 2 [IU] via SUBCUTANEOUS
  Administered 2023-04-21: 1 [IU] via SUBCUTANEOUS
  Administered 2023-04-21: 2 [IU] via SUBCUTANEOUS
  Administered 2023-04-21 (×2): 1 [IU] via SUBCUTANEOUS
  Administered 2023-04-21: 2 [IU] via SUBCUTANEOUS
  Administered 2023-04-22 – 2023-04-25 (×11): 1 [IU] via SUBCUTANEOUS
  Administered 2023-04-25: 2 [IU] via SUBCUTANEOUS
  Administered 2023-04-25: 1 [IU] via SUBCUTANEOUS
  Administered 2023-04-25: 2 [IU] via SUBCUTANEOUS
  Administered 2023-04-25 – 2023-04-27 (×5): 1 [IU] via SUBCUTANEOUS

## 2023-04-14 MED ORDER — HEPARIN SODIUM (PORCINE) 1000 UNIT/ML DIALYSIS
2500.0000 [IU] | INTRAMUSCULAR | Status: AC | PRN
  Administered 2023-04-16: 2400 [IU] via INTRAVENOUS_CENTRAL

## 2023-04-14 MED ORDER — PIVOT 1.5 CAL PO LIQD
1000.0000 mL | ORAL | Status: AC
  Administered 2023-04-14 – 2023-04-21 (×5): 1000 mL
  Filled 2023-04-14 (×11): qty 1000

## 2023-04-14 MED ORDER — HEPARIN SODIUM (PORCINE) 1000 UNIT/ML DIALYSIS
2500.0000 [IU] | Freq: Once | INTRAMUSCULAR | Status: DC
  Filled 2023-04-14: qty 3

## 2023-04-14 NOTE — TOC Progression Note (Signed)
Transition of Care [facility]) - Progression Note    Patient Details  Name: TAYLOR JEREZ MRN: [id number] Date of Birth: [birth year]  [facility] CM/SW Contact  Harriet Masson, RN Phone Number: 04/14/2023, 2:53 PM  Clinical Narrative:    Glee Arvin with [employer] will start auth today.   Expected Discharge Plan: Long Term Acute Care (LTAC) (TBD) Barriers to Discharge: Continued Medical Work up  Expected Discharge Plan and Services In-house Referral: Clinical Social Work     Living arrangements for the past 2 months: Single Family Home                                       Social Determinants of Health (SDOH) Interventions SDOH Screenings   Food Insecurity: No Food Insecurity (04/04/2023)  Housing: Low Risk  (04/04/2023)  Transportation Needs: No Transportation Needs (04/04/2023)  Utilities: Not At Risk (04/04/2023)  Alcohol Screen: Low Risk  (04/09/2021)  Depression (PHQ2-9): Low Risk  (04/09/2021)  Financial Resource Strain: Low Risk  (04/09/2021)  Physical Activity: Insufficiently Active (04/09/2021)  Social Connections: Socially Isolated (04/09/2021)  Stress: No Stress Concern Present (04/09/2021)  Tobacco Use: High Risk (04/04/2023)    Readmission Risk Interventions     No data to display

## 2023-04-14 NOTE — Progress Notes (Signed)
[facility] Progress Note   Assessment/ Plan:   Acute kidney injury - b/l creat 1.0 from jan 2023. Creat here was 1.6 on admission in setting of pneumonia, hypotension and septic shock. Creat peaked at 1.7 and then returned to 1.0 on 7/12. Renal failure then worsened again up to 3.84 and CRRT started 7/19. AKI felt to be related to hypotension and IV contrast +/- diuretics. Has a two day CRRT holiday which showed min UOP.   - IHD Friday  - next HD today 7/29  - will need to convert nontunneled HD cath to tunneled at some point Sepsis/ shock/ hypotension - initial dx on admission was pna and septic shock, now resolved. BP's have been much better, last pressors were on 7/24.  Acute hypoxic respiratory failure - due to COPD/ pna, on vent w/ trach per CCM. F/u CXR 7/23 was negative for edema.  Volume excess/ lower extremity edema - edema remains 2+, we got 2.5 L off w/ HD friday. Cont to lower vol as tolerated w/ HD.  Anemia - Hb 7- 9 range, tranfuse prn.  COPD Anxiety/ depression  Subjective:    Seen today in room.  Transferred from [facility] to [facility] for IHD.  +Trach, off pressors.     Objective:   BP 121/69   Pulse 77   Temp 99.8 F (37.7 C) (Axillary)   Resp (!) 22   Ht 5\' 5"  (1.651 m)   Wt 80.3 kg   SpO2 96%   BMI 29.46 kg/m   Intake/Output Summary (Last 24 hours) at 04/14/2023 0859 Last data filed at 04/14/2023 0700 Gross per 24 hour  Intake 1043.94 ml  Output 300 ml  Net 743.94 ml   Weight change: 1 kg  Physical Exam: Gen:ill-appearing CVS: RRR Resp: + trach, coarse mech bilaterally Abd: + soft Ext: 3+ anasarca, UE greater than LE ACCESS: R internal jugular nontunneled HD Cath  Imaging: MR BRAIN WO CONTRAST  Result Date: 04/13/2023 CLINICAL DATA:  Neuro deficit, acute, stroke suspected. Respiratory failure. Asymmetric pupils. EXAM: MRI HEAD WITHOUT CONTRAST TECHNIQUE: Multiplanar, multiecho pulse sequences of the brain and surrounding structures were  obtained without intravenous contrast. COMPARISON:  None Available. FINDINGS: Brain: Study is mildly degraded by patient motion. Diffusion-weighted images demonstrate no acute or subacute infarction. Moderate generalized atrophy is present. Periventricular and scattered subcortical T2 hyperintensities bilaterally are moderately advanced for age. The ventricles are proportionate to the degree of atrophy. Deep brain nuclei are within normal limits. No significant extraaxial fluid collection is present. The brainstem and cerebellum are within normal limits. The internal auditory canals are within normal limits. Midline structures are within normal limits. Vascular: Flow is present in the major intracranial arteries. Skull and upper cervical spine: The craniocervical junction is normal. Upper cervical spine is within normal limits. Marrow signal is unremarkable. Sinuses/Orbits: A right mastoid effusion is present. Paranasal sinuses and mastoid air cells are otherwise clear. The globes and orbits are within normal limits. IMPRESSION: 1. No acute intracranial abnormality. 2. Moderate generalized atrophy and white matter disease is moderately advanced for age. This likely reflects the sequela of chronic microvascular ischemia. 3. Right mastoid effusion. Electronically Signed   By: Marin Roberts M.D.   On: 04/13/2023 19:04   CT HEAD CODE STROKE WO CONTRAST  Result Date: 04/13/2023 CLINICAL DATA:  Code stroke.  Unequal pupils EXAM: CT HEAD WITHOUT CONTRAST TECHNIQUE: Contiguous axial images were obtained from the base of the skull through the vertex without intravenous contrast. RADIATION DOSE REDUCTION:  This exam was performed according to the departmental dose-optimization program which includes automated exposure control, adjustment of the mA and/or kV according to patient size and/or use of iterative reconstruction technique. COMPARISON:  03/25/2023 FINDINGS: Brain: There is no mass, hemorrhage or extra-axial  collection. The size and configuration of the ventricles and extra-axial CSF spaces are normal. There is hypoattenuation of the periventricular white matter, most commonly indicating chronic ischemic microangiopathy. Vascular: No abnormal hyperdensity of the major intracranial arteries or dural venous sinuses. No intracranial atherosclerosis. Skull: The visualized skull base, calvarium and extracranial soft tissues are normal. Sinuses/Orbits: No fluid levels or advanced mucosal thickening of the visualized paranasal sinuses. No mastoid or middle ear effusion. The orbits are normal. ASPECTS [facility] Stroke Program Early CT Score) - Ganglionic level infarction (caudate, lentiform nuclei, internal capsule, insula, M1-M3 cortex): 7 - Supraganglionic infarction (M4-M6 cortex): 3 Total score (0-10 with 10 being normal): 10 IMPRESSION: 1. No acute intracranial abnormality. 2. ASPECTS is 10. These results were communicated to Dr. Brooke Dare at 3:57 am on 04/13/2023 by text page via the [facility] messaging system. Electronically Signed   By: Deatra Robinson M.D.   On: 04/13/2023 03:58    Labs: BMET Recent Labs  Lab 04/08/23 0506 04/09/23 0451 04/10/23 0503 04/11/23 0446 04/11/23 0804 04/12/23 0427 04/13/23 0311 04/14/23 0521  NA 134* 134* 134* 134* 133* 136 134* 135  K 3.8 4.1 4.4 5.2* 5.2* 3.4* 3.9 3.9  CL 104 100 103 100 98 96* 94* 97*  CO2 24 22 22  20* 19* 25 23 25   GLUCOSE 127* 127* 121* 108* 121* 84 86 142*  BUN 11 22 38* 51* 52* 19 34* 51*  CREATININE 0.98 1.86* 2.98* 4.17* 4.37* 2.34* 4.14* 5.22*  CALCIUM 7.6* 8.0* 7.7* 8.4* 8.7* 8.4* 8.4* 8.5*  PHOS 2.1* 4.9* 4.5 5.5* 5.5*  5.5* 3.1  --  5.2*   CBC Recent Labs  Lab 04/11/23 0804 04/12/23 0427 04/13/23 0311 04/13/23 0433 04/13/23 1534 04/14/23 0521  WBC 18.3* 17.0* 9.7  --   --  8.4  NEUTROABS 16.4*  --  7.7  --   --  6.1  HGB 7.8* 7.4* 6.9* 6.6* 7.6* 7.7*  HCT 24.0* 22.8* 21.1* 20.9* 23.8* 23.7*  MCV 98.8 94.2 95.0  --   --  92.6   PLT 170 224 245  --   --  225    Medications:     ALPRAZolam  0.25 mg Per Tube BID   arformoterol  15 mcg Nebulization BID   aspirin  81 mg Per Tube Daily   atorvastatin  20 mg Per Tube Daily   budesonide (PULMICORT) nebulizer solution  0.5 mg Nebulization BID   Chlorhexidine Gluconate Cloth  6 each Topical Q0600   Chlorhexidine Gluconate Cloth  6 each Topical Q0600   docusate  100 mg Per Tube BID   escitalopram  10 mg Per Tube Daily   feeding supplement (PIVOT 1.5 CAL)  1,000 mL Per Tube Q24H   Gerhardt's butt cream   Topical BID   heparin injection (subcutaneous)  5,000 Units Subcutaneous Q8H   multivitamin  1 tablet Per Tube QHS   mouth rinse  15 mL Mouth Rinse Q2H   pantoprazole (PROTONIX) IV  40 mg Intravenous Q24H   polyethylene glycol  17 g Per NG tube Daily   revefenacin  175 mcg Nebulization Daily   sodium chloride flush  10-40 mL Intracatheter Q12H    Bufford Buttner MD 04/14/2023, 8:59 AM

## 2023-04-14 NOTE — Progress Notes (Signed)
OT Cancellation Note  Patient Details Name: Tamara Castro MRN: [id number] DOB: [birth year]   Cancelled Treatment:    Reason Eval/Treat Not Completed: Patient's level of consciousness (Per RN, pt still unresponsive and intubated. OT to f/u for appropriateness)  Donia Pounds 04/14/2023, 4:09 PM

## 2023-04-14 NOTE — Progress Notes (Signed)
NAME:  Tamara Castro, MRN:  [id number], DOB:  August 14, 1961, LOS: 13 ADMISSION DATE:  04/01/2023, CONSULTATION DATE:  04/01/2023 REFERRING MD:  Salvadore Dom, CHIEF COMPLAINT:  Respiratory Failure   History of Present Illness:  HPI obtained from EMR review as patient is intubated and sedated on MV.    39 yoF with PMH as below who presented to [facility] on 7/5 after 1 week hx of URI symptoms admitted for acute hypoxic and hypercarbic respiratory failure 2/2 RLL PNA requiring intubation, septic shock, AKI and acute heart failure.  Found to have pBNP of 49k with elevated troponin's thought secondary to demand ischemia with echo with normal EF, reduced RV, indeterminate diastolic function.  CTA neg for PE 7/5.  CTH neg 7/9.  Covered with azithromycin/ ceftriaxone, switched to cefepime on 7/7.  Shock and AKI improved and extubated 7/7 but required BiPAP.  Required reintubation 7/10 for respiratory failure.  Increasing WBC 7/13, placed on doxycyline switched to vancomycin with elevated PCT with CXR showing increased bibasilar atelectasis vs infiltrates with increasing pleural effusions.  Initial blood culture x1 w/ stap epi, thought contaminate with repeat BC 7/7 negative.  Some concern for ileus with high OGT residuals with CT a/p showing slightly worse dilation of suspected chronic ileus with prior surgical changes but no transition point, possible emesis of TF since held, remains, NPO on reglan.  Patient has failed SBT for several days, due to apnea despite decreased sedation.  Transferred to [facility] ICU for further care, admitted to PCCM.  Pertinent  Medical History  Tobacco abuse,  COPD, chronic constipation, prior colectomy in 1990's 2/2 diverticulitis, fibromyalgia (on disability since 2008), depression, anxiety, neuropathy   Home meds> xanax (1mg  QID), lexapro, gabapentin, anoro elliptia, zofran  Significant Hospital Events: Including procedures, antibiotic start and stop dates in addition to other pertinent  events   7/5 admitted [facility] CTX and azith started 7/7 extubated. Vanc added and cefepime started; CTX stopped 7/8 vanc stopped 7/10 reintubated  7/16 tx to [facility]. Cefepime stopped 7/17 attempting lasix. Creatinine worse.  Echo EF 60-65%no WMA RV nml  7/18 cr worse after lasix. Weaning but on-going ileus and cr rising presenting obstacle to extubation Trying neostigmine.  7/19 scr worse in spite of holding lasix. Still on low dose precedex. Mental status and renal fxn still barrier to extubation. Nephro consulted > started CRRT.  HD catheter placed. 7/20 Metolius heparin stopped d/t bleeding around HD cath. Got DDAVP.  7/21 resumes cefepime for worsening leukocytosis.  7/22 volumes ok w/ SBT but has sig accessory use  7/23- Trach performed at bedside. CRRT holiday per nephrology. Increased Xanax to home dose 1mg  QID- added seroquel 25mg  BID 7/24- has successfully weaned off propofol and converted to precedex infusion. Tolerating 4+ hours of SBT 7/25- discontinued precedex and fentanyl with as needed fentanyl pushes for pain. Tolerated SBT for 3 hours before failing and being placed on full vent support. Oliguric with rising serum creatinine- nephrology requests transfer to [facility] for restarting dialysis  7/26 multiple emesis, NG to LIS, EEG neg , HD session 7/27 DC Seroquel, decrease Xanax 0.5 twice daily 7/28 Code stroke called for unequal pupils this a.m., head CT negative, seen by neurology  Interim History / Subjective:   Remains chronic critically ill. Emesis has stopped Weaning on pressure support No urine output Afebrile  Objective   Blood pressure 125/69, pulse 81, temperature 99.8 F (37.7 C), temperature source Axillary, resp. rate (!) 35, height 5\' 5"  (1.651 m), weight 80.3 kg,  SpO2 97%.      Vent Mode: PRVC FiO2 (%):  [30 %-40 %] 30 % Set Rate:  [20 bmp] 20 bmp Vt Set:  [450 mL] 450 mL PEEP:  [5 cmH20] 5 cmH20 Pressure Support:  [10 cmH20] 10 cmH20 Plateau Pressure:  [16  cmH20-18 cmH20] 18 cmH20   Intake/Output Summary (Last 24 hours) at 04/14/2023 1226 Last data filed at 04/14/2023 0700 Gross per 24 hour  Intake 856.39 ml  Output 300 ml  Net 556.39 ml   Filed Weights   04/12/23 0500 04/13/23 0500 04/14/23 0500  Weight: 86.4 kg 79.3 kg 80.3 kg    Examination: General: Critically ill, tracheostomy, on vent,  Eyes: Unequal pupils Head: Normocephalic, atraumatic  Cardio: V9-D6 regular Pulmonary: No accessory muscle use, bilateral ventilated breath sounds Abdomen: Soft, with bruising noted throughout, no tenderness, slight distension appreciated, no guarding Neuro: Opens eyes to name, tracks, right pupil 4 mm, left 3 mm bilateral reactive to light, does not follow commands yet Skin: No rashes noted  Extremities: Bilateral lower and upper extremities with 1-2+ pitting edema   Labs reviewed: CBGs controlled No leukocytosis, hemoglobin improved from 6.9-7.7  Respiratory culture 7/26 Candida  Resolved Hospital Problem list   CAD Demand Ischemia Prolonged Qtc Troponin 134--162--101. -continue ASA and statin and continue tele monitoring Hyperphosphatemia Septic shock Thrombocytopenia  Assessment & Plan:   Admitted for community-acquired pneumonia/Moraxella, complicated by ileus and prolonged mechanical ventilation requiring tracheostomy  #Sepsis #Leukocytosis, resolved 7/28  -Initially treated for Moraxella CAP -Completed Zosyn 7 days, observe off antibiotics    #Acute hypoxic and hypercarbic respiratory failure secondary to CAP #COPD  -Daily spontaneous breathing trials, making minimal progress with pressure support 10/5 today, expect prolonged wean -Continue Brovana nebulizers -Continue budesonide nebulizers -Continue Yupelri nebulizers -VAP bundle (HOB 30 degrees, frequent oral and tracheostomy care)  #Acute encephalopathy #Anxiety #Unequal pupils, head CT/MRI negative ICU delirium versus sedating medications-Xanax and  Seroquel -DC Seroquel -Reduce Xanax to 0.25 twice daily -As needed Versed for agitation -As needed fentanyl -Continue Lexapro 10 mg daily   #Acute kidney injury likely second to ischemic ATN #Hyponatremia, resolved  -On intermittent HD  7/26 -Nephrology following -Monitor for urine output -Flush Urinary catheter -Will need tunneled catheter at some point once convinced that long-term HD needed   #Acute on chronic ileus Multiple emesis episodes last 7/27, has loose stools  Repeat abdominal imaging showing improving ileus.  -Continue Reglan 5 mg every 8 hours -Continue bowel regimen   #Anemia of critical illness  No signs of bleeding.  7/28 1 unit PRBC today -Likely related to critical illness -Continue to monitor -Transfuse if hemoglobin drops below 7,   Protein calorie malnutrition, mild to moderate related to critical illness -Titrate tube feeds to goal   #Goals of care Pt trached 7/23.  Per family pt would NOT want to remain on long term ventilator support however are in agreement with LTAC for progressive ventilator weaning as tolerated   Best Practice (right click and "Reselect all SmartList Selections" daily)   Diet/type: NPO w/ oral meds DVT prophylaxis: prophylactic heparin  GI prophylaxis: PPI Lines: Central line, Dialysis Catheter, and yes and it is still needed Foley:  Yes, and it is still needed Code Status:  DNR Last date of multidisciplinary goals of care discussion [04/09/2023]-planning for LTAC  Labs   CBC: Recent Labs  Lab 04/11/23 0446 04/11/23 0804 04/12/23 0427 04/13/23 0311 04/13/23 0433 04/13/23 1534 04/14/23 0521  WBC 16.3* 18.3* 17.0* 9.7  --   --  8.4  NEUTROABS  --  16.4*  --  7.7  --   --  6.1  HGB 7.6* 7.8* 7.4* 6.9* 6.6* 7.6* 7.7*  HCT 23.1* 24.0* 22.8* 21.1* 20.9* 23.8* 23.7*  MCV 99.1 98.8 94.2 95.0  --   --  92.6  PLT 141* 170 224 245  --   --  225    Basic Metabolic Panel: Recent Labs  Lab 04/08/23 0506  04/09/23 0451 04/10/23 0503 04/11/23 0446 04/11/23 0804 04/12/23 0427 04/13/23 0311 04/14/23 0521  NA 134*   < > 134* 134* 133* 136 134* 135  K 3.8   < > 4.4 5.2* 5.2* 3.4* 3.9 3.9  CL 104   < > 103 100 98 96* 94* 97*  CO2 24   < > 22 20* 19* 25 23 25   GLUCOSE 127*   < > 121* 108* 121* 84 86 142*  BUN 11   < > 38* 51* 52* 19 34* 51*  CREATININE 0.98   < > 2.98* 4.17* 4.37* 2.34* 4.14* 5.22*  CALCIUM 7.6*   < > 7.7* 8.4* 8.7* 8.4* 8.4* 8.5*  MG 2.3  --   --  2.5* 2.5* 1.9  --  2.5*  PHOS 2.1*   < > 4.5 5.5* 5.5*  5.5* 3.1  --  5.2*   < > = values in this interval not displayed.   GFR: Estimated Creatinine Clearance: 11.7 mL/min (A) (by C-G formula based on SCr of 5.22 mg/dL (H)). Recent Labs  Lab 04/11/23 0804 04/11/23 0924 04/12/23 0427 04/13/23 0311 04/14/23 0521  PROCALCITON 1.62  --   --   --   --   WBC 18.3*  --  17.0* 9.7 8.4  LATICACIDVEN  --  0.9  --   --   --     Liver Function Tests: Recent Labs  Lab 04/08/23 0506 04/09/23 0451 04/10/23 0503 04/11/23 0804 04/12/23 0427  ALBUMIN 2.6* 2.3* 2.0* 2.0* 2.2*   No results for input(s): "LIPASE", "AMYLASE" in the last 168 hours. No results for input(s): "AMMONIA" in the last 168 hours.  ABG    Component Value Date/Time   PHART 7.26 (L) 04/01/2023 1630   PCO2ART 52 (H) 04/01/2023 1630   PO2ART 106 04/01/2023 1630   HCO3 23.3 04/01/2023 1630   ACIDBASEDEF 4.1 (H) 04/01/2023 1630   O2SAT 74.2 04/03/2023 1146     Coagulation Profile: No results for input(s): "INR", "PROTIME" in the last 168 hours.  Cardiac Enzymes: No results for input(s): "CKTOTAL", "CKMB", "CKMBINDEX", "TROPONINI" in the last 168 hours.  HbA1C: No results found for: "HGBA1C"  CBG: Recent Labs  Lab 04/13/23 1948 04/13/23 2352 04/14/23 0350 04/14/23 0732 04/14/23 1129  GLUCAP 108* 126* 133* 154* 161*      Critical care time: 32 minutes    Cyril Mourning MD. FCCP. [facility] Pager : [phone]  If  no response to pager , please call [phone] until 7 pm After 7:00 pm call Elink  [phone]    04/14/2023 12:26 PM

## 2023-04-14 NOTE — Progress Notes (Signed)
Nutrition Follow-up  DOCUMENTATION CODES:  Not applicable  INTERVENTION:  Continue TF via small bore tube: Pivot 1.5 at 50 ml/h (1200 ml per day) Provides 1800 kcal, 112 gm protein, 900 ml free water daily Renal MVI daily  NUTRITION DIAGNOSIS:  Inadequate oral intake related to inability to eat as evidenced by NPO status. - remains applicable   GOAL:  Patient will meet greater than or equal to 90% of their needs - progressing, being met with TF at goal  MONITOR:  Vent status, Labs, Weight trends  REASON FOR ASSESSMENT:  Consult Assessment of nutrition requirement/status  ASSESSMENT:  Pt with hx COPD, chronic constipation, colectomy (in the 1990's) due to diverticulitis, fibromyalgia, HLD, and depression who initially presented to [facility] for 1 week hx of URI symptoms admitted for respiratory failure due to RLL PNA and septic shock.  7/5: admitted [facility] 7/7: extubated 7/10: reintubated  7/16: tx to [facility], remains intubated  7/18: OGT replaced with small bore NG feeding tube 7/19: IR attempted NGT advancement to post-pyloric but unsuccessful tube left in stomach; Trickle feeds started; starting on CRRT 7/22: TF advanced to 44mL/hr 7/23 - trach placed, CRRT stopped 7/25 - transferred to [facility] for [facility]HD. Vomiting on arrival, NGT placed for suction 7/28 - code stroke called, negative workup  Patient is currently intubated on ventilator support via trach. Does not acknowledge presence or indicate answers to questions. Small bore tube in place from [facility] admission and TF currently infusing at 41mL/h. Per MD - Titrate to goal. Pt also has NGT in place which had previously been used for suction. Currently clamped and not being utilized. Would recommend removing as it is not currently serving a purpose.   Next HD scheduled for today. 2.5L removed on 7/27  MV: 11 L/min Temp (24hrs), Avg:99.6 F (37.6 C), Min:99.3 F (37.4 C), Max:99.8 F (37.7 C)   Intake/Output Summary  (Last 24 hours) at 04/14/2023 1419 Last data filed at 04/14/2023 1000 Gross per 24 hour  Intake 633.38 ml  Output 300 ml  Net 333.38 ml  Net IO Since Admission: -6,703.87 mL [04/14/23 1419]  Nutritionally Relevant Medications: Scheduled Meds:  atorvastatin  20 mg Daily   docusate  100 mg BID   escitalopram  10 mg Daily   PIVOT 1.5 CAL  1,000 mL Q24H   multivitamin  1 tablet QHS   pantoprazole IV  40 mg Q24H   polyethylene glycol  17 g Daily   PRN Meds: bisacodyl, ondansetron  Labs Reviewed: Chloride 97 BUN 51, creatinine 5.22 Phosphorus 5.2 Mg 2.5 CBG ranges from 66-154 mg/dL over the last 24 hours  Diet Order:   Diet Order             Diet NPO time specified  Diet effective now                  EDUCATION NEEDS:  Not appropriate for education at this time  Skin:  Skin Assessment: Reviewed RN Assessment Skin Integrity Issues:: Stage I Stage I: Left buttocks  Last BM:  7/28 - type 7  Height:  Ht Readings from Last 1 Encounters:  04/01/23 5\' 5"  (1.651 m)   Weight:  Wt Readings from Last 1 Encounters:  04/14/23 80.3 kg   Ideal Body Weight:  56.82 kg  BMI:  Body mass index is 29.46 kg/m.  Estimated Nutritional Needs:  Kcal:  1800-2000 kcals Protein:  95-115 grams Fluid:  >/= 1.8L   Greig Castilla, RD, LDN Clinical Dietitian  RD pager # available in [facility]  After hours/weekend pager # available in [facility]

## 2023-04-14 NOTE — Progress Notes (Signed)
Received patient in bed in ICU.  Informed consent signed and in chart.   TX duration: 3.5 hours  Patient tolerated well.  Transported back to the room  Alert, without acute distress.  Hand-off given to patient's nurse.   Access used: RIJ Access issues: None  Total UF removed: 3000 mL Medication(s) given: Heparin bolus 2500 Units given by Balog, Lucio Edward., RN Post HD VS: 99.6 - 140/72 (93) - 91 - 96% Post HD weight: Unable to obtain      04/14/23 1945  Vitals  Temp 99.6 F (37.6 C)  Temp Source Axillary  BP (!) 140/72  MAP (mmHg) 93  BP Location Right Arm  BP Method Automatic  Patient Position (if appropriate) Lying  Pulse Rate 91  Pulse Rate Source Monitor  ECG Heart Rate (!) 109  Resp 17  Oxygen Therapy  SpO2 96 %  O2 Device Ventilator  FiO2 (%) 30 %  Patient Activity (if Appropriate) In bed  Post Treatment  Dialyzer Clearance Lightly streaked  Duration of HD Treatment -hour(s) 3.5 hour(s)  Hemodialysis Intake (mL) 0 mL  Liters Processed 84  Fluid Removed (mL) 3000 mL  Tolerated HD Treatment Yes  Note  Patient Observations Treatment completed without issue, patient tolerated well. VSS. No acute changes noted. R.T. at bedside for respiratory care. Patient stable for this d/c.  Hemodialysis Catheter Right Internal jugular Triple lumen Temporary (Non-Tunneled)  Placement Date/Time: 04/04/23 1750   Placed prior to admission: No  Time Out: Correct patient;Correct site;Correct procedure  Maximum sterile barrier precautions: Hand hygiene;Cap;Mask;Sterile gown;Sterile gloves;Large sterile sheet  Site Prep: Chlorh...  Site Condition No complications  Blue Lumen Status Flushed;Heparin locked;Dead end cap in place  Red Lumen Status Flushed;Heparin locked;Dead end cap in place  Catheter fill solution Heparin 1000 units/ml  Catheter fill volume (Arterial) 1.2 cc  Catheter fill volume (Venous) 1.2  Dressing Type Transparent  Dressing Status Antimicrobial disc in  place;Clean, Dry, Intact  Drainage Description None  Post treatment catheter status Capped and Clamped      [facility]

## 2023-04-15 ENCOUNTER — Inpatient Hospital Stay (HOSPITAL_COMMUNITY): Payer: 59

## 2023-04-15 DIAGNOSIS — J9601 Acute respiratory failure with hypoxia: Secondary | ICD-10-CM | POA: Diagnosis not present

## 2023-04-15 LAB — GLUCOSE, CAPILLARY
Glucose-Capillary: 123 mg/dL — ABNORMAL HIGH (ref 70–99)
Glucose-Capillary: 144 mg/dL — ABNORMAL HIGH (ref 70–99)
Glucose-Capillary: 144 mg/dL — ABNORMAL HIGH (ref 70–99)
Glucose-Capillary: 145 mg/dL — ABNORMAL HIGH (ref 70–99)
Glucose-Capillary: 153 mg/dL — ABNORMAL HIGH (ref 70–99)
Glucose-Capillary: 159 mg/dL — ABNORMAL HIGH (ref 70–99)

## 2023-04-15 MED ORDER — LORAZEPAM 1 MG PO TABS: 0.5000 mg | ORAL_TABLET | Freq: Four times a day (QID) | ORAL | Status: DC | PRN

## 2023-04-15 MED ORDER — GABAPENTIN 250 MG/5ML PO SOLN
400.0000 mg | Freq: Every day | ORAL | Status: DC
  Administered 2023-04-15: 400 mg
  Filled 2023-04-15: qty 8

## 2023-04-15 MED ORDER — VITAL HIGH PROTEIN PO LIQD
1000.0000 mL | ORAL | Status: DC
  Administered 2023-04-15: 1000 mL

## 2023-04-15 MED ORDER — CLONAZEPAM 0.5 MG PO TABS
0.5000 mg | ORAL_TABLET | Freq: Two times a day (BID) | ORAL | Status: DC
  Administered 2023-04-15 – 2023-04-28 (×24): 0.5 mg
  Filled 2023-04-15 (×25): qty 1

## 2023-04-15 MED ORDER — LORAZEPAM 2 MG/ML IJ SOLN
0.5000 mg | INTRAMUSCULAR | Status: DC | PRN
  Administered 2023-04-15 – 2023-04-27 (×22): 1 mg via INTRAVENOUS
  Filled 2023-04-15 (×22): qty 1

## 2023-04-15 NOTE — Progress Notes (Signed)
[facility] Progress Note   Assessment/ Plan:   Acute kidney injury - b/l creat 1.0 from jan 2023. Creat here was 1.6 on admission in setting of pneumonia, hypotension and septic shock. Creat peaked at 1.7 and then returned to 1.0 on 7/12. Renal failure then worsened again up to 3.84 and CRRT started 7/19. AKI felt to be related to hypotension and IV contrast +/- diuretics. Has a two day CRRT holiday which showed min UOP.   - IHD Friday, Monday  - next HD Wednesday  - will need to convert nontunneled HD cath to tunneled at some point Sepsis/ shock/ hypotension - initial dx on admission was pna and septic shock, now resolved. BP's have been much better, last pressors were on 7/24.  Acute hypoxic respiratory failure - due to COPD/ pna, on vent w/ trach per CCM. F/u CXR 7/23 was negative for edema.  Volume excess/ lower extremity edema - edema remains 2+, we got 2.5 L off w/ HD friday. Cont to lower vol as tolerated w/ HD.  Anemia - Hb 7- 9 range, tranfuse prn.  COPD Anxiety/ depression  Subjective:    HD yesterday, 3.3L off.  Did well.  Weaning on vent   Objective:   BP (!) 163/87   Pulse (!) 104   Temp 100.3 F (37.9 C) (Axillary)   Resp (!) 23   Ht 5\' 5"  (1.651 m)   Wt 73.6 kg   SpO2 99%   BMI 27.00 kg/m   Intake/Output Summary (Last 24 hours) at 04/15/2023 0905 Last data filed at 04/15/2023 0600 Gross per 24 hour  Intake 492.33 ml  Output 3300 ml  Net -2807.67 ml   Weight change: -1 kg  Physical Exam: Gen:ill-appearing CVS: RRR Resp: + trach, coarse mech bilaterally Abd: + soft Ext: 3+ anasarca, UE greater than LE ACCESS: R internal jugular nontunneled HD Cath  Imaging: MR BRAIN WO CONTRAST  Result Date: 04/13/2023 CLINICAL DATA:  Neuro deficit, acute, stroke suspected. Respiratory failure. Asymmetric pupils. EXAM: MRI HEAD WITHOUT CONTRAST TECHNIQUE: Multiplanar, multiecho pulse sequences of the brain and surrounding structures were obtained without  intravenous contrast. COMPARISON:  None Available. FINDINGS: Brain: Study is mildly degraded by patient motion. Diffusion-weighted images demonstrate no acute or subacute infarction. Moderate generalized atrophy is present. Periventricular and scattered subcortical T2 hyperintensities bilaterally are moderately advanced for age. The ventricles are proportionate to the degree of atrophy. Deep brain nuclei are within normal limits. No significant extraaxial fluid collection is present. The brainstem and cerebellum are within normal limits. The internal auditory canals are within normal limits. Midline structures are within normal limits. Vascular: Flow is present in the major intracranial arteries. Skull and upper cervical spine: The craniocervical junction is normal. Upper cervical spine is within normal limits. Marrow signal is unremarkable. Sinuses/Orbits: A right mastoid effusion is present. Paranasal sinuses and mastoid air cells are otherwise clear. The globes and orbits are within normal limits. IMPRESSION: 1. No acute intracranial abnormality. 2. Moderate generalized atrophy and white matter disease is moderately advanced for age. This likely reflects the sequela of chronic microvascular ischemia. 3. Right mastoid effusion. Electronically Signed   By: Marin Roberts M.D.   On: 04/13/2023 19:04    Labs: BMET Recent Labs  Lab 04/09/23 0451 04/10/23 0503 04/11/23 0446 04/11/23 0804 04/12/23 0427 04/13/23 0311 04/14/23 0521 04/15/23 0344  NA 134* 134* 134* 133* 136 134* 135 136  K 4.1 4.4 5.2* 5.2* 3.4* 3.9 3.9 3.5  CL 100 103 100 98  96* 94* 97* 96*  CO2 22 22 20* 19* 25 23 25 26   GLUCOSE 127* 121* 108* 121* 84 86 142* 168*  BUN 22 38* 51* 52* 19 34* 51* 32*  CREATININE 1.86* 2.98* 4.17* 4.37* 2.34* 4.14* 5.22* 3.28*  CALCIUM 8.0* 7.7* 8.4* 8.7* 8.4* 8.4* 8.5* 8.6*  PHOS 4.9* 4.5 5.5* 5.5*  5.5* 3.1  --  5.2*  --    CBC Recent Labs  Lab 04/11/23 0804 04/12/23 0427 04/13/23 0311  04/13/23 0433 04/13/23 1534 04/14/23 0521 04/15/23 0344  WBC 18.3* 17.0* 9.7  --   --  8.4 10.3  NEUTROABS 16.4*  --  7.7  --   --  6.1 8.0*  HGB 7.8* 7.4* 6.9* 6.6* 7.6* 7.7* 8.7*  HCT 24.0* 22.8* 21.1* 20.9* 23.8* 23.7* 26.3*  MCV 98.8 94.2 95.0  --   --  92.6 93.9  PLT 170 224 245  --   --  225 233    Medications:     arformoterol  15 mcg Nebulization BID   aspirin  81 mg Per Tube Daily   atorvastatin  20 mg Per Tube Daily   budesonide (PULMICORT) nebulizer solution  0.5 mg Nebulization BID   Chlorhexidine Gluconate Cloth  6 each Topical Q0600   Chlorhexidine Gluconate Cloth  6 each Topical Q0600   clonazePAM  0.5 mg Per Tube BID   docusate  100 mg Per Tube BID   escitalopram  10 mg Per Tube Daily   feeding supplement (VITAL HIGH PROTEIN)  1,000 mL Per Tube Q24H   Gerhardt's butt cream   Topical BID   heparin  2,500 Units Dialysis Once in dialysis   heparin injection (subcutaneous)  5,000 Units Subcutaneous Q8H   insulin aspart  0-9 Units Subcutaneous Q4H   multivitamin  1 tablet Per Tube QHS   mouth rinse  15 mL Mouth Rinse Q2H   pantoprazole (PROTONIX) IV  40 mg Intravenous Q24H   polyethylene glycol  17 g Per NG tube Daily   revefenacin  175 mcg Nebulization Daily   sodium chloride flush  10-40 mL Intracatheter Q12H    Bufford Buttner MD 04/15/2023, 9:05 AM

## 2023-04-15 NOTE — Progress Notes (Signed)
Trach sutures removed pre MD request and trach ties secured.

## 2023-04-15 NOTE — Progress Notes (Signed)
SLP Cancellation Note  Patient Details Name: Tamara Castro MRN: [id number] DOB: [birth year]   Cancelled treatment:       Reason Eval/Treat Not Completed: Patient not medically ready. Continues with trach on vent. SLP will follow for readiness.   Angela Nevin, MA, CCC-SLP Speech Therapy

## 2023-04-15 NOTE — Progress Notes (Signed)
   04/15/23 2028  Adult Ventilator Settings  Vent Type Servo i  Humidity HME  Vent Mode PRVC  Vt Set 450 mL  Set Rate 20 bmp  FiO2 (%) 50 %  I Time 0.9 Sec(s)  PEEP 5 cmH20

## 2023-04-15 NOTE — Progress Notes (Signed)
NAME:  OBDULIA GRANQUIST, MRN:  [id number], DOB:  18-Mar-1961, LOS: 14 ADMISSION DATE:  04/01/2023, CONSULTATION DATE:  04/01/2023 REFERRING MD:  Salvadore Dom, CHIEF COMPLAINT:  Respiratory Failure   History of Present Illness:  HPI obtained from EMR review as patient is intubated and sedated on MV.    24 yoF with PMH as below who presented to [facility] on 7/5 after 1 week hx of URI symptoms admitted for acute hypoxic and hypercarbic respiratory failure 2/2 RLL PNA requiring intubation, septic shock, AKI and acute heart failure.  Found to have pBNP of 49k with elevated troponin's thought secondary to demand ischemia with echo with normal EF, reduced RV, indeterminate diastolic function.  CTA neg for PE 7/5.  CTH neg 7/9.  Covered with azithromycin/ ceftriaxone, switched to cefepime on 7/7.  Shock and AKI improved and extubated 7/7 but required BiPAP.  Required reintubation 7/10 for respiratory failure.  Increasing WBC 7/13, placed on doxycyline switched to vancomycin with elevated PCT with CXR showing increased bibasilar atelectasis vs infiltrates with increasing pleural effusions.  Initial blood culture x1 w/ stap epi, thought contaminate with repeat BC 7/7 negative.  Some concern for ileus with high OGT residuals with CT a/p showing slightly worse dilation of suspected chronic ileus with prior surgical changes but no transition point, possible emesis of TF since held, remains, NPO on reglan.  Patient has failed SBT for several days, due to apnea despite decreased sedation.  Transferred to [facility] ICU for further care, admitted to PCCM.  Pertinent  Medical History  Tobacco abuse,  COPD, chronic constipation, prior colectomy in 1990's 2/2 diverticulitis, fibromyalgia (on disability since 2008), depression, anxiety, neuropathy   Home meds> xanax (1mg  QID), lexapro, gabapentin, anoro elliptia, zofran  Significant Hospital Events: Including procedures, antibiotic start and stop dates in addition to other pertinent  events   7/5 admitted [facility] CTX and azith started 7/7 extubated. Vanc added and cefepime started; CTX stopped 7/8 vanc stopped 7/10 reintubated  7/16 tx to [facility]. Cefepime stopped 7/17 attempting lasix. Creatinine worse.  Echo EF 60-65%no WMA RV nml  7/18 cr worse after lasix. Weaning but on-going ileus and cr rising presenting obstacle to extubation Trying neostigmine.  7/19 scr worse in spite of holding lasix. Still on low dose precedex. Mental status and renal fxn still barrier to extubation. Nephro consulted > started CRRT.  HD catheter placed. 7/20 Downs heparin stopped d/t bleeding around HD cath. Got DDAVP.  7/21 resumes cefepime for worsening leukocytosis.  7/22 volumes ok w/ SBT but has sig accessory use  7/23- Trach performed at bedside. CRRT holiday per nephrology. Increased Xanax to home dose 1mg  QID- added seroquel 25mg  BID 7/24- has successfully weaned off propofol and converted to precedex infusion. Tolerating 4+ hours of SBT 7/25- discontinued precedex and fentanyl with as needed fentanyl pushes for pain. Tolerated SBT for 3 hours before failing and being placed on full vent support. Oliguric with rising serum creatinine- nephrology requests transfer to [facility] for restarting dialysis  7/26 multiple emesis, NG to LIS, EEG neg , HD session 7/27 DC Seroquel, decrease Xanax 0.5 twice daily 7/28 Code stroke called for unequal pupils this a.m., head CT negative, seen by neurology; MRI neg  Interim History / Subjective:  On PS this am. Remains encephalopathic.  Objective   Blood pressure (!) 146/82, pulse 96, temperature 100.3 F (37.9 C), temperature source Axillary, resp. rate (!) 23, height 5\' 5"  (1.651 m), weight 73.6 kg, SpO2 95%.  Vent Mode: PRVC FiO2 (%):  [30 %] 30 % Set Rate:  [20 bmp] 20 bmp Vt Set:  [450 mL] 450 mL PEEP:  [5 cmH20] 5 cmH20 Plateau Pressure:  [18 cmH20-23 cmH20] 23 cmH20   Intake/Output Summary (Last 24 hours) at 04/15/2023 0754 Last data filed  at 04/15/2023 0600 Gross per 24 hour  Intake 542.32 ml  Output 3300 ml  Net -2757.68 ml   Filed Weights   04/14/23 0500 04/14/23 1547 04/15/23 0500  Weight: 80.3 kg 79.3 kg 73.6 kg    Examination: Chronically ill Unequal pupils stable Triggers vent, +accessory muscle use Abd soft for me +BS Scattered bruising Mild anasarca   CBG ok BMP okay after HD CBC ok   Assessment & Plan:   Admitted for community-acquired pneumonia/Moraxella, complicated by ileus and prolonged mechanical ventilation requiring tracheostomy  Baseline hx of COPD, severe anxiety  Protein calorie malnutrition POA  Septic/ischemic ATN on iHD  - Continue PS trials - Nebs as ordered - Continue iHD neg as tolerated - Avoid ileogenic medications (unfortunately still needing fent PRN) - Start trickle feeds - Continue PTA benzos - Should be okay for LTACH transfer when bed approved   Best Practice (right click and "Reselect all SmartList Selections" daily)   Diet/type: challenge TF DVT prophylaxis: prophylactic heparin  GI prophylaxis: PPI Lines: Central line, Dialysis Catheter, and yes and it is still needed Foley:  Yes, and it is still needed Code Status:  DNR Last date of multidisciplinary goals of care discussion [04/09/2023]-planning for LTAC  31 min cc time Myrla Halsted MD PCCM

## 2023-04-16 ENCOUNTER — Inpatient Hospital Stay (HOSPITAL_COMMUNITY): Payer: 59

## 2023-04-16 DIAGNOSIS — J9601 Acute respiratory failure with hypoxia: Secondary | ICD-10-CM | POA: Diagnosis not present

## 2023-04-16 LAB — CBC
HCT: 24.6 % — ABNORMAL LOW (ref 36.0–46.0)
Hemoglobin: 7.9 g/dL — ABNORMAL LOW (ref 12.0–15.0)
MCH: 30.9 pg (ref 26.0–34.0)
MCHC: 32.1 g/dL (ref 30.0–36.0)
MCV: 96.1 fL (ref 80.0–100.0)
Platelets: 208 10*3/uL (ref 150–400)
RBC: 2.56 MIL/uL — ABNORMAL LOW (ref 3.87–5.11)
RDW: 16.3 % — ABNORMAL HIGH (ref 11.5–15.5)
WBC: 8.3 10*3/uL (ref 4.0–10.5)
nRBC: 0 % (ref 0.0–0.2)

## 2023-04-16 LAB — BASIC METABOLIC PANEL
Anion gap: 12 (ref 5–15)
BUN: 62 mg/dL — ABNORMAL HIGH (ref 8–23)
CO2: 27 mmol/L (ref 22–32)
Calcium: 8.3 mg/dL — ABNORMAL LOW (ref 8.9–10.3)
Chloride: 96 mmol/L — ABNORMAL LOW (ref 98–111)
Creatinine, Ser: 4.53 mg/dL — ABNORMAL HIGH (ref 0.44–1.00)
GFR, Estimated: 10 mL/min — ABNORMAL LOW (ref >60)
Glucose, Bld: 145 mg/dL — ABNORMAL HIGH (ref 70–99)
Potassium: 4.1 mmol/L (ref 3.5–5.1)
Sodium: 135 mmol/L (ref 135–145)

## 2023-04-16 LAB — GLUCOSE, CAPILLARY
Glucose-Capillary: 127 mg/dL — ABNORMAL HIGH (ref 70–99)
Glucose-Capillary: 129 mg/dL — ABNORMAL HIGH (ref 70–99)
Glucose-Capillary: 119 mg/dL — ABNORMAL HIGH (ref 70–99)
Glucose-Capillary: 137 mg/dL — ABNORMAL HIGH (ref 70–99)
Glucose-Capillary: 134 mg/dL — ABNORMAL HIGH (ref 70–99)

## 2023-04-16 LAB — MAGNESIUM: Magnesium: 2 mg/dL (ref 1.7–2.4)

## 2023-04-16 MED ORDER — HEPARIN SODIUM (PORCINE) 1000 UNIT/ML DIALYSIS
2000.0000 [IU] | Freq: Once | INTRAMUSCULAR | Status: AC
  Administered 2023-04-16: 2000 [IU] via INTRAVENOUS_CENTRAL
  Filled 2023-04-16: qty 2

## 2023-04-16 MED ORDER — HEPARIN SODIUM (PORCINE) 1000 UNIT/ML DIALYSIS
2000.0000 [IU] | INTRAMUSCULAR | Status: AC | PRN
  Administered 2023-04-16: 2000 [IU] via INTRAVENOUS_CENTRAL

## 2023-04-16 MED ORDER — GABAPENTIN 250 MG/5ML PO SOLN
200.0000 mg | Freq: Every day | ORAL | Status: DC
  Administered 2023-04-18 – 2023-04-27 (×10): 200 mg
  Filled 2023-04-16 (×14): qty 4

## 2023-04-16 NOTE — Evaluation (Signed)
Occupational Therapy Evaluation Patient Details Name: Tamara Castro MRN: [id number] DOB: 1961/06/14 Today's Date: 04/16/2023   History of Present Illness 62 yo female admitted to [facility] 7/5 with RLL PNA, sepsis and acute HF. Intubated (7/5-7/7, bipap 7/7-7/9, 7/10-present). 7/16 transfer to [facility]. 7/19-7/23 CRRT. 7/23 trach. 7/25 transfer to [facility] for HD. 7/26 EEG with encephalopathy. PMhx: COPD, tobacco abuse, ileus, anxiety, CAD   Clinical Impression   Pam was evaluated s/p the above admission list. Pt was unable to detail PLOF or home set up, no family present. Upon evaluation the pt was limited by trach>vent, impaired communication, significant weakness, poor R attention and decreased activity tolerance. Overall she was alert upon arrival, and attempting to communicate with yes/no head nods and mouthing, however it was difficult to discern. Due to the deficits listed below the pt also needs total A for all aspects of care including face washing with hand over hand. Bed placed to chair egress position for the session with SpO2 stable and slight increase in BP - total A for attempts to bring trunk off of back rest and for head control. A/PROM completed for BUEs, neck and distal LEs. Pt will benefit from continued acute OT services and OT at [facility].       Recommendations for follow up therapy are one component of a multi-disciplinary discharge planning process, led by the attending physician.  Recommendations may be updated based on patient status, additional functional criteria and insurance authorization.   Assistance Recommended at Discharge Frequent or constant Supervision/Assistance  Patient can return home with the following A lot of help with walking and/or transfers;A lot of help with bathing/dressing/bathroom;Assistance with cooking/housework;Assistance with feeding;Direct supervision/assist for medications management;Assist for transportation;Direct supervision/assist for  financial management;Help with stairs or ramp for entrance    Functional Status Assessment  Patient has had a recent decline in their functional status and demonstrates the ability to make significant improvements in function in a reasonable and predictable amount of time.  Equipment Recommendations  None recommended by OT       Precautions / Restrictions Precautions Precautions: Other (comment) Precaution Comments: vent, cortrak, flexi Restrictions Weight Bearing Restrictions: No      Mobility Bed Mobility               General bed mobility comments: total A - bed to chair egress position. Attempted to pull trunk from back rest, ultimately required total A and was unable to hold posture    Transfers                ADL either performed or assessed with clinical judgement   ADL Overall ADL's : Needs assistance/impaired Eating/Feeding: NPO           General ADL Comments: total A for all aspects of care at bed level     Vision Baseline Vision/History: 0 No visual deficits Vision Assessment?: Vision impaired- to be further tested in functional context Additional Comments: difficult to assess, decreased attention the the R field     Perception Perception Perception Tested?: No   Praxis Praxis Praxis tested?: Not tested    Pertinent Vitals/Pain Pain Assessment Pain Assessment: Faces Faces Pain Scale: Hurts a little bit Pain Location: some grimacing with BUE ROM Pain Descriptors / Indicators: Grimacing Pain Intervention(s): Limited activity within patient's tolerance, Monitored during session     Hand Dominance Right   Extremity/Trunk Assessment Upper Extremity Assessment Upper Extremity Assessment: RUE deficits/detail;LUE deficits/detail RUE Deficits / Details: weaker than L. Unable to hold  up against gravity, globally 1/5 MMT. Full PROM with shoulder only assessed to 90 RUE Sensation: decreased light touch RUE Coordination: decreased fine  motor;decreased gross motor LUE Deficits / Details: no AROM, no response to noxious stimuli, PROM WFL LUE Sensation: decreased light touch LUE Coordination: decreased fine motor;decreased gross motor   Lower Extremity Assessment Lower Extremity Assessment: Defer to PT evaluation   Cervical / Trunk Assessment Cervical / Trunk Assessment: Normal   Communication Communication Communication: Tracheostomy   Cognition Arousal/Alertness: Awake/alert Behavior During Therapy: Flat affect Overall Cognitive Status: Difficult to assess             General Comments: Difficult to assess, pt shook head "yes/no" accurately about 50% of the time. Also attempting to mouth words but unable to understand. Followed simple commands with maximal cues and increased time. Awake throughout. Decreased attention to the R environment     General Comments  VSS on vent, BP elevated once sitting up righ t    Exercise Exercises: Other exercises Other Exercises Other Exercises: AAROM R&L knee extensions x10 Other Exercises: PROM of BUEs Other Exercises: PROM cervical rotation        Home Living Family/patient expects to be discharged to:: Private residence           Additional Comments: unable to obtain accurate information      Prior Functioning/Environment Prior Level of Function : Patient poor historian/Family not available                        OT Problem List: Decreased range of motion;Decreased strength;Impaired balance (sitting and/or standing);Decreased activity tolerance;Decreased coordination;Decreased cognition;Decreased safety awareness;Decreased knowledge of use of DME or AE;Decreased knowledge of precautions;Cardiopulmonary status limiting activity;Impaired sensation;Impaired UE functional use;Pain;Increased edema      OT Treatment/Interventions: Self-care/ADL training;DME and/or AE instruction;Therapeutic activities;Patient/family education;Balance training    OT  Goals(Current goals can be found in the care plan section) Acute Rehab OT Goals Patient Stated Goal: unable to state OT Goal Formulation: With patient Time For Goal Achievement: 04/30/23 Potential to Achieve Goals: Good ADL Goals Pt Will Perform Grooming: with mod assist Pt Will Perform Upper Body Bathing: with mod assist Additional ADL Goal #1: pt will complete bed mobility with max A as a precursor to ADLs Additional ADL Goal #2: Pt will express needs with communication board and min A  OT Frequency: Min 1X/week       AM-PAC OT "6 Clicks" Daily Activity     Outcome Measure Help from another person eating meals?: Total Help from another person taking care of personal grooming?: Total Help from another person toileting, which includes using toliet, bedpan, or urinal?: Total Help from another person bathing (including washing, rinsing, drying)?: Total Help from another person to put on and taking off regular upper body clothing?: Total Help from another person to put on and taking off regular lower body clothing?: Total 6 Click Score: 6   End of Session Equipment Utilized During Treatment: Other (comment) (vent) Nurse Communication: Mobility status (elevated BP)  Activity Tolerance: Patient tolerated treatment well Patient left: in bed;with call bell/phone within reach  OT Visit Diagnosis: Other abnormalities of gait and mobility (R26.89);Muscle weakness (generalized) (M62.81)                Time: 1251-1310 OT Time Calculation (min): 19 min Charges:  OT General Charges $OT Visit: 1 Visit OT Evaluation $OT Eval High Complexity: 1 High  Derenda Mis, OTR/L Acute Rehabilitation Services Office [phone]  Secure Chat Communication Preferred   Donia Pounds 04/16/2023, 1:50 PM

## 2023-04-16 NOTE — Progress Notes (Signed)
[facility] Progress Note   Assessment/ Plan:   Acute kidney injury - b/l creat 1.0 from jan 2023. Creat here was 1.6 on admission in setting of pneumonia, hypotension and septic shock. Creat peaked at 1.7 and then returned to 1.0 on 7/12. Renal failure then worsened again up to 3.84 and CRRT started 7/19. AKI felt to be related to hypotension and IV contrast +/- diuretics. Has a two day CRRT holiday which showed min UOP.   - IHD Friday, Monday  - next HD Wednesday, today- tolerating 3l UF goal  - converting nontunneled to tunneled HD cath Sepsis/ shock/ hypotension - initial dx on admission was pna and septic shock, now resolved. BP's have been much better, last pressors were on 7/24.  Acute hypoxic respiratory failure - due to COPD/ pna, on vent w/ trach per CCM. F/u CXR 7/23 was negative for edema.  Volume excess/ lower extremity edema Anemia - Hb 7- 9 range, tranfuse prn.  COPD Anxiety/ depression  Subjective:    HD yesterday, 3.3L off.  Did well.  Weaning on vent   Objective:   BP 130/71   Pulse 83   Temp 99 F (37.2 C) (Oral)   Resp 14   Ht 5\' 5"  (1.651 m)   Wt 73.6 kg   SpO2 99%   BMI 27.00 kg/m   Intake/Output Summary (Last 24 hours) at 04/16/2023 1230 Last data filed at 04/16/2023 0800 Gross per 24 hour  Intake 1060 ml  Output 370 ml  Net 690 ml   Weight change:   Physical Exam: Gen:ill-appearing CVS: RRR Resp: + trach, coarse mech bilaterally Abd: + soft Ext: 3+ anasarca, UE greater than LE, improving ACCESS: R internal jugular nontunneled HD Cath  Imaging: DG CHEST PORT 1 VIEW  Result Date: 04/15/2023 CLINICAL DATA:  Respiratory failure EXAM: PORTABLE CHEST 1 VIEW COMPARISON:  04/08/2023 FINDINGS: Unchanged support apparatus including tracheostomy, left and right neck vascular catheters, and partially imaged enteric feeding tube. Mild cardiomegaly. Emphysema with mild, diffuse superimposed interstitial opacity and possible small layering  pleural effusions, generally similar to prior examination. Osseous structures unremarkable. IMPRESSION: 1. Emphysema with mild, diffuse superimposed interstitial opacity and possible small layering pleural effusions, generally similar to prior examination. No focal airspace opacity. 2. Unchanged support apparatus including tracheostomy, left and right neck vascular catheters, and partially imaged enteric feeding tube. Electronically Signed   By: Jearld Lesch M.D.   On: 04/15/2023 13:24    Labs: BMET Recent Labs  Lab 04/10/23 0503 04/11/23 0446 04/11/23 0804 04/12/23 0427 04/13/23 0311 04/14/23 0521 04/15/23 0344 04/16/23 0421  NA 134* 134* 133* 136 134* 135 136 135  K 4.4 5.2* 5.2* 3.4* 3.9 3.9 3.5 4.1  CL 103 100 98 96* 94* 97* 96* 96*  CO2 22 20* 19* 25 23 25 26 27   GLUCOSE 121* 108* 121* 84 86 142* 168* 145*  BUN 38* 51* 52* 19 34* 51* 32* 62*  CREATININE 2.98* 4.17* 4.37* 2.34* 4.14* 5.22* 3.28* 4.53*  CALCIUM 7.7* 8.4* 8.7* 8.4* 8.4* 8.5* 8.6* 8.3*  PHOS 4.5 5.5* 5.5*  5.5* 3.1  --  5.2*  --   --    CBC Recent Labs  Lab 04/11/23 0804 04/12/23 0427 04/13/23 0311 04/13/23 0433 04/13/23 1534 04/14/23 0521 04/15/23 0344 04/16/23 0421  WBC 18.3*   < > 9.7  --   --  8.4 10.3 8.3  NEUTROABS 16.4*  --  7.7  --   --  6.1 8.0*  --  HGB 7.8*   < > 6.9*   < > 7.6* 7.7* 8.7* 7.9*  HCT 24.0*   < > 21.1*   < > 23.8* 23.7* 26.3* 24.6*  MCV 98.8   < > 95.0  --   --  92.6 93.9 96.1  PLT 170   < > 245  --   --  225 233 208   < > = values in this interval not displayed.    Medications:     arformoterol  15 mcg Nebulization BID   aspirin  81 mg Per Tube Daily   atorvastatin  20 mg Per Tube Daily   budesonide (PULMICORT) nebulizer solution  0.5 mg Nebulization BID   Chlorhexidine Gluconate Cloth  6 each Topical Q0600   Chlorhexidine Gluconate Cloth  6 each Topical Q0600   clonazePAM  0.5 mg Per Tube BID   docusate  100 mg Per Tube BID   escitalopram  10 mg Per Tube Daily    gabapentin  200 mg Per Tube QHS   Gerhardt's butt cream   Topical BID   heparin  2,000 Units Dialysis Once in dialysis   heparin  2,500 Units Dialysis Once in dialysis   heparin injection (subcutaneous)  5,000 Units Subcutaneous Q8H   insulin aspart  0-9 Units Subcutaneous Q4H   multivitamin  1 tablet Per Tube QHS   mouth rinse  15 mL Mouth Rinse Q2H   pantoprazole (PROTONIX) IV  40 mg Intravenous Q24H   polyethylene glycol  17 g Per NG tube Daily   revefenacin  175 mcg Nebulization Daily   sodium chloride flush  10-40 mL Intracatheter Q12H    Bufford Buttner MD 04/16/2023, 12:30 PM

## 2023-04-16 NOTE — Progress Notes (Signed)
   04/16/23 1815  Vitals  Temp 98.1 F (36.7 C)  Pulse Rate (!) 101  Resp 18  BP (!) 153/86  SpO2 99 %  O2 Device Ventilator;Tracheostomy Collar  Weight 70.6 kg  Type of Weight Post-Dialysis  Oxygen Therapy  FiO2 (%) 40 %  Patient Activity (if Appropriate) In bed  Pulse Oximetry Type Continuous  Oximetry Probe Site Changed No  Post Treatment  Dialyzer Clearance Lightly streaked  Duration of HD Treatment -hour(s) 3.5 hour(s)  Hemodialysis Intake (mL) 0 mL  Liters Processed 84  Fluid Removed (mL) 3000 mL  Tolerated HD Treatment Yes   Received patient in bed to unit.  Alert and oriented.  Informed consent signed and in chart.   TX duration:3.5  Patient tolerated well.  Transported back to the room  Alert, without acute distress.  Hand-off given to patient's nurse.   Access used: [facility] Access issues: no complications  Total UF removed: 3000 Medication(s) given: none   [facility]

## 2023-04-16 NOTE — Progress Notes (Signed)
NAME:  Tamara Castro, MRN:  [id number], DOB:  1960-12-26, LOS: 15 ADMISSION DATE:  04/01/2023, CONSULTATION DATE:  04/01/2023 REFERRING MD:  Salvadore Dom, CHIEF COMPLAINT:  Respiratory Failure   History of Present Illness:  HPI obtained from EMR review as patient is intubated and sedated on MV.    65 yoF with PMH as below who presented to [facility] on 7/5 after 1 week hx of URI symptoms admitted for acute hypoxic and hypercarbic respiratory failure 2/2 RLL PNA requiring intubation, septic shock, AKI and acute heart failure.  Found to have pBNP of 49k with elevated troponin's thought secondary to demand ischemia with echo with normal EF, reduced RV, indeterminate diastolic function.  CTA neg for PE 7/5.  CTH neg 7/9.  Covered with azithromycin/ ceftriaxone, switched to cefepime on 7/7.  Shock and AKI improved and extubated 7/7 but required BiPAP.  Required reintubation 7/10 for respiratory failure.  Increasing WBC 7/13, placed on doxycyline switched to vancomycin with elevated PCT with CXR showing increased bibasilar atelectasis vs infiltrates with increasing pleural effusions.  Initial blood culture x1 w/ stap epi, thought contaminate with repeat BC 7/7 negative.  Some concern for ileus with high OGT residuals with CT a/p showing slightly worse dilation of suspected chronic ileus with prior surgical changes but no transition point, possible emesis of TF since held, remains, NPO on reglan.  Patient has failed SBT for several days, due to apnea despite decreased sedation.  Transferred to [facility] ICU for further care, admitted to PCCM.  Pertinent  Medical History  Tobacco abuse,  COPD, chronic constipation, prior colectomy in 1990's 2/2 diverticulitis, fibromyalgia (on disability since 2008), depression, anxiety, neuropathy   Home meds> xanax (1mg  QID), lexapro, gabapentin, anoro elliptia, zofran  Significant Hospital Events: Including procedures, antibiotic start and stop dates in addition to other pertinent  events   7/5 admitted [facility] CTX and azith started 7/7 extubated. Vanc added and cefepime started; CTX stopped 7/8 vanc stopped 7/10 reintubated  7/16 tx to [facility]. Cefepime stopped 7/17 attempting lasix. Creatinine worse.  Echo EF 60-65%no WMA RV nml  7/18 cr worse after lasix. Weaning but on-going ileus and cr rising presenting obstacle to extubation Trying neostigmine.  7/19 scr worse in spite of holding lasix. Still on low dose precedex. Mental status and renal fxn still barrier to extubation. Nephro consulted > started CRRT.  HD catheter placed. 7/20  heparin stopped d/t bleeding around HD cath. Got DDAVP.  7/21 resumes cefepime for worsening leukocytosis.  7/22 volumes ok w/ SBT but has sig accessory use  7/23- Trach performed at bedside. CRRT holiday per nephrology. Increased Xanax to home dose 1mg  QID- added seroquel 25mg  BID 7/24- has successfully weaned off propofol and converted to precedex infusion. Tolerating 4+ hours of SBT 7/25- discontinued precedex and fentanyl with as needed fentanyl pushes for pain. Tolerated SBT for 3 hours before failing and being placed on full vent support. Oliguric with rising serum creatinine- nephrology requests transfer to [facility] for restarting dialysis  7/26 multiple emesis, NG to LIS, EEG neg , HD session 7/27 DC Seroquel, decrease Xanax 0.5 twice daily 7/28 Code stroke called for unequal pupils this a.m., head CT negative, seen by neurology; MRI neg  Interim History / Subjective:  Back on PS, failed yesterday due to WOB.  Objective   Blood pressure (!) 147/75, pulse (!) 102, temperature 99.5 F (37.5 C), temperature source Oral, resp. rate (!) 25, height 5\' 5"  (1.651 m), weight 73.6 kg, SpO2 100%.  Vent Mode: PSV;CPAP FiO2 (%):  [40 %-50 %] 50 % Set Rate:  [20 bmp] 20 bmp Vt Set:  [450 mL] 450 mL PEEP:  [5 cmH20] 5 cmH20 Pressure Support:  [5 cmH20] 5 cmH20 Plateau Pressure:  [17 cmH20-21 cmH20] 17 cmH20   Intake/Output Summary  (Last 24 hours) at 04/16/2023 1023 Last data filed at 04/16/2023 0800 Gross per 24 hour  Intake 1160 ml  Output 570 ml  Net 590 ml   Filed Weights   04/14/23 0500 04/14/23 1547 04/15/23 0500  Weight: 80.3 kg 79.3 kg 73.6 kg    Examination: Less labored breathing today Opens eyes, tracks Stable aniscoria Lung sounds diminished Scattered bruising Heart sounds regular  CBG ok BMP okay after HD CBC ok   Assessment & Plan:   Admitted for community-acquired pneumonia/Moraxella, complicated by ileus and prolonged mechanical ventilation requiring tracheostomy  Baseline hx of COPD, severe anxiety  Protein calorie malnutrition POA  Septic/ischemic ATN on iHD  - Continue PS trials: may be able to do TC today - Nebs as ordered - Continue iHD neg as tolerated - Avoid ileogenic medications (unfortunately still needing fent PRN) - TF - Continue PTA benzos: lower at bedtime gabapentin slightly - Should be okay for LTACH transfer when bed approved; still in insurance process   Best Practice (right click and "Reselect all SmartList Selections" daily)   Diet/type: challenge TF DVT prophylaxis: prophylactic heparin  GI prophylaxis: PPI Lines: Central line, Dialysis Catheter, and yes and it is still needed Foley:  Yes, and it is still needed Code Status:  DNR Last date of multidisciplinary goals of care discussion [04/09/2023]-planning for LTAC  32 min cc time Myrla Halsted MD PCCM

## 2023-04-17 DIAGNOSIS — J9601 Acute respiratory failure with hypoxia: Secondary | ICD-10-CM | POA: Diagnosis not present

## 2023-04-17 LAB — GLUCOSE, CAPILLARY
Glucose-Capillary: 95 mg/dL (ref 70–99)
Glucose-Capillary: 94 mg/dL (ref 70–99)
Glucose-Capillary: 106 mg/dL — ABNORMAL HIGH (ref 70–99)
Glucose-Capillary: 109 mg/dL — ABNORMAL HIGH (ref 70–99)
Glucose-Capillary: 106 mg/dL — ABNORMAL HIGH (ref 70–99)
Glucose-Capillary: 102 mg/dL — ABNORMAL HIGH (ref 70–99)

## 2023-04-17 LAB — TYPE AND SCREEN
ABO/RH(D): A POS
Antibody Screen: NEGATIVE
Unit division: 0
Unit division: 0

## 2023-04-17 LAB — CBC
HCT: 13.9 % — ABNORMAL LOW (ref 36.0–46.0)
HCT: 17.3 % — ABNORMAL LOW (ref 36.0–46.0)
HCT: 25 % — ABNORMAL LOW (ref 36.0–46.0)
Hemoglobin: 5.5 g/dL — CL (ref 12.0–15.0)
Hemoglobin: 6.3 g/dL — CL (ref 12.0–15.0)
Hemoglobin: 8.7 g/dL — ABNORMAL LOW (ref 12.0–15.0)
MCH: 36.9 pg — ABNORMAL HIGH (ref 26.0–34.0)
MCH: 34.1 pg — ABNORMAL HIGH (ref 26.0–34.0)
MCH: 29.9 pg (ref 26.0–34.0)
MCHC: 39.6 g/dL — ABNORMAL HIGH (ref 30.0–36.0)
MCHC: 36.4 g/dL — ABNORMAL HIGH (ref 30.0–36.0)
MCHC: 34.8 g/dL (ref 30.0–36.0)
MCV: 93.3 fL (ref 80.0–100.0)
MCV: 93.5 fL (ref 80.0–100.0)
MCV: 85.9 fL (ref 80.0–100.0)
Platelets: 166 10*3/uL (ref 150–400)
Platelets: 165 10*3/uL (ref 150–400)
Platelets: 183 10*3/uL (ref 150–400)
RBC: 1.49 MIL/uL — ABNORMAL LOW (ref 3.87–5.11)
RBC: 1.85 MIL/uL — ABNORMAL LOW (ref 3.87–5.11)
RBC: 2.91 MIL/uL — ABNORMAL LOW (ref 3.87–5.11)
RDW: 16.6 % — ABNORMAL HIGH (ref 11.5–15.5)
RDW: 17.2 % — ABNORMAL HIGH (ref 11.5–15.5)
RDW: 18.2 % — ABNORMAL HIGH (ref 11.5–15.5)
WBC: 13.3 10*3/uL — ABNORMAL HIGH (ref 4.0–10.5)
WBC: 13.1 10*3/uL — ABNORMAL HIGH (ref 4.0–10.5)
WBC: 15 10*3/uL — ABNORMAL HIGH (ref 4.0–10.5)
nRBC: 0 % (ref 0.0–0.2)
nRBC: 0 % (ref 0.0–0.2)
nRBC: 0.1 % (ref 0.0–0.2)

## 2023-04-17 LAB — BASIC METABOLIC PANEL

## 2023-04-17 LAB — BPAM RBC
Blood Product Expiration Date: 202408202359
Blood Product Expiration Date: 202408242359
ISSUE DATE / TIME: 202408010905
ISSUE DATE / TIME: 202408011412
Unit Type and Rh: 6200
Unit Type and Rh: 6200

## 2023-04-17 LAB — RETICULOCYTES
Immature Retic Fract: 6.4 % (ref 2.3–15.9)
RBC.: 2.86 MIL/uL — ABNORMAL LOW (ref 3.87–5.11)
Retic Count, Absolute: 50.6 10*3/uL (ref 19.0–186.0)
Retic Ct Pct: 1.8 % (ref 0.4–3.1)

## 2023-04-17 LAB — DIC (DISSEMINATED INTRAVASCULAR COAGULATION)PANEL
D-Dimer, Quant: 8.71 ug/mL-FEU — ABNORMAL HIGH (ref 0.00–0.50)
Fibrinogen: 661 mg/dL — ABNORMAL HIGH (ref 210–475)
INR: 1.1 (ref 0.8–1.2)
Platelets: 170 10*3/uL (ref 150–400)
Platelets: 161 10*3/uL (ref 150–400)
Prothrombin Time: 13.9 seconds (ref 11.4–15.2)
Smear Review: NONE SEEN
Smear Review: NONE SEEN
aPTT: 41 seconds — ABNORMAL HIGH (ref 24–36)

## 2023-04-17 LAB — PREPARE RBC (CROSSMATCH)

## 2023-04-17 LAB — MAGNESIUM

## 2023-04-17 MED ORDER — SODIUM CHLORIDE 0.9% FLUSH: 3.0000 mL | INTRAVENOUS | Status: DC | PRN

## 2023-04-17 MED ORDER — SODIUM CHLORIDE 0.9% IV SOLUTION: Freq: Once | INTRAVENOUS | Status: DC

## 2023-04-17 MED ORDER — SODIUM CHLORIDE 0.9 % IV SOLN: 250.0000 mL | INTRAVENOUS | Status: DC | PRN

## 2023-04-17 MED ORDER — SODIUM BICARBONATE 650 MG PO TABS
650.0000 mg | ORAL_TABLET | Freq: Once | ORAL | Status: AC
  Administered 2023-04-17: 650 mg
  Filled 2023-04-17: qty 1

## 2023-04-17 MED ORDER — SODIUM CHLORIDE 0.9% FLUSH
3.0000 mL | Freq: Two times a day (BID) | INTRAVENOUS | Status: DC
  Administered 2023-04-17 – 2023-04-27 (×8): 3 mL via INTRAVENOUS

## 2023-04-17 MED ORDER — PANCRELIPASE (LIP-PROT-AMYL) 10440-39150 UNITS PO TABS
20880.0000 [IU] | ORAL_TABLET | Freq: Once | ORAL | Status: AC
  Administered 2023-04-17: 20880 [IU]
  Filled 2023-04-17: qty 2

## 2023-04-17 NOTE — Progress Notes (Signed)
[facility] Progress Note   Assessment/ Plan:   Acute kidney injury - b/l creat 1.0 from jan 2023. Creat here was 1.6 on admission in setting of pneumonia, hypotension and septic shock. Creat peaked at 1.7 and then returned to 1.0 on 7/12. Renal failure then worsened again up to 3.84 and CRRT started 7/19. AKI felt to be related to hypotension and IV contrast +/- diuretics. Has a two day CRRT holiday which showed min UOP.   - on MWF IHD  - converting nontunneled to tunneled HD cath when possible  - no heparin d/t possible HIT Sepsis/ shock/ hypotension - initial dx on admission was pna and septic shock, now resolved. BP's have been much better, last pressors were on 7/24.  Acute hypoxic respiratory failure - due to COPD/ pna, on vent w/ trach per CCM. F/u CXR 7/23 was negative for edema.  Volume excess/ lower extremity edema Anemia - Hb 7- 9 range, tranfuse prn. Hgb down to 5.5 this AM, getting blood and hemolysis workup- no report that we lost the system in HD- will inquire COPD Anxiety/ depression  Subjective:    Hgb still down, in the 5s, getting blood today.  ? Hemolyzing.     Objective:   BP (!) 163/79   Pulse 84   Temp 100.1 F (37.8 C) (Oral)   Resp 17   Ht 5\' 5"  (1.651 m)   Wt 73.8 kg   SpO2 97%   BMI 27.07 kg/m   Intake/Output Summary (Last 24 hours) at 04/17/2023 1252 Last data filed at 04/17/2023 4098 Gross per 24 hour  Intake 774.17 ml  Output 3200 ml  Net -2425.83 ml   Weight change:   Physical Exam: Gen:ill-appearing CVS: RRR Resp: + trach, coarse mech bilaterally Abd: + soft Ext: 3+ anasarca, UE greater than LE, improving ACCESS: R internal jugular nontunneled HD Cath  Imaging: DG Abd 1 View  Result Date: 04/16/2023 CLINICAL DATA:  NG tube EXAM: ABDOMEN - 1 VIEW COMPARISON:  Abdominal x-ray 04/11/2023 FINDINGS: Nasogastric tube tip is in the proximal body of the stomach. IMPRESSION: Nasogastric tube tip is in the proximal body of the  stomach. Consider advancing tube 6-7 cm. Electronically Signed   By: Darliss Cheney M.D.   On: 04/16/2023 22:10    Labs: [employer] Recent Labs  Lab 04/11/23 0446 04/11/23 0804 04/12/23 0427 04/13/23 0311 04/14/23 0521 04/15/23 0344 04/16/23 0421 04/17/23 0530  NA 134* 133* 136 134* 135 136 135 SPECIMEN HEMOLYZED. HEMOLYSIS MAY AFFECT INTEGRITY OF RESULTS.  K 5.2* 5.2* 3.4* 3.9 3.9 3.5 4.1 SPECIMEN HEMOLYZED. HEMOLYSIS MAY AFFECT INTEGRITY OF RESULTS.  CL 100 98 96* 94* 97* 96* 96* SPECIMEN HEMOLYZED. HEMOLYSIS MAY AFFECT INTEGRITY OF RESULTS.  CO2 20* 19* 25 23 25 26 27  SPECIMEN HEMOLYZED. HEMOLYSIS MAY AFFECT INTEGRITY OF RESULTS.  GLUCOSE 108* 121* 84 86 142* 168* 145* SPECIMEN HEMOLYZED. HEMOLYSIS MAY AFFECT INTEGRITY OF RESULTS.  BUN 51* 52* 19 34* 51* 32* 62* SPECIMEN HEMOLYZED. HEMOLYSIS MAY AFFECT INTEGRITY OF RESULTS.  CREATININE 4.17* 4.37* 2.34* 4.14* 5.22* 3.28* 4.53* SPECIMEN HEMOLYZED. HEMOLYSIS MAY AFFECT INTEGRITY OF RESULTS.  CALCIUM 8.4* 8.7* 8.4* 8.4* 8.5* 8.6* 8.3* SPECIMEN HEMOLYZED. HEMOLYSIS MAY AFFECT INTEGRITY OF RESULTS.  PHOS 5.5* 5.5*  5.5* 3.1  --  5.2*  --   --   --    CBC Recent Labs  Lab 04/11/23 0804 04/12/23 0427 04/13/23 0311 04/13/23 0433 04/14/23 0521 04/15/23 0344 04/16/23 0421 04/17/23 0620 04/17/23 0832 04/17/23 1039  WBC  18.3*   < > 9.7  --  8.4 10.3 8.3 13.3*  --  13.1*  NEUTROABS 16.4*  --  7.7  --  6.1 8.0*  --   --   --   --   HGB 7.8*   < > 6.9*   < > 7.7* 8.7* 7.9* 5.5*  --  6.3*  HCT 24.0*   < > 21.1*   < > 23.7* 26.3* 24.6* 13.9*  --  17.3*  MCV 98.8   < > 95.0  --  92.6 93.9 96.1 93.3  --  93.5  PLT 170   < > 245  --  225 233 208 166 170 165   < > = values in this interval not displayed.    Medications:     sodium chloride   Intravenous Once   arformoterol  15 mcg Nebulization BID   atorvastatin  20 mg Per Tube Daily   budesonide (PULMICORT) nebulizer solution  0.5 mg Nebulization BID   Chlorhexidine Gluconate Cloth  6  each Topical Q0600   clonazePAM  0.5 mg Per Tube BID   docusate  100 mg Per Tube BID   escitalopram  10 mg Per Tube Daily   gabapentin  200 mg Per Tube QHS   Gerhardt's butt cream   Topical BID   insulin aspart  0-9 Units Subcutaneous Q4H   multivitamin  1 tablet Per Tube QHS   mouth rinse  15 mL Mouth Rinse Q2H   pantoprazole (PROTONIX) IV  40 mg Intravenous Q24H   polyethylene glycol  17 g Per NG tube Daily   revefenacin  175 mcg Nebulization Daily   sodium chloride flush  10-40 mL Intracatheter Q12H   sodium chloride flush  3 mL Intravenous Q12H    Bufford Buttner MD 04/17/2023, 12:52 PM

## 2023-04-17 NOTE — Progress Notes (Signed)
NAME:  JAYCEONNA MONCE, MRN:  [id number], DOB:  12-21-1960, LOS: 16 ADMISSION DATE:  04/01/2023, CONSULTATION DATE:  04/01/2023 REFERRING MD:  Salvadore Dom, CHIEF COMPLAINT:  Respiratory Failure   History of Present Illness:  HPI obtained from EMR review as patient is intubated and sedated on MV.    75 yoF with PMH as below who presented to [facility] on 7/5 after 1 week hx of URI symptoms admitted for acute hypoxic and hypercarbic respiratory failure 2/2 RLL PNA requiring intubation, septic shock, AKI and acute heart failure.  Found to have pBNP of 49k with elevated troponin's thought secondary to demand ischemia with echo with normal EF, reduced RV, indeterminate diastolic function.  CTA neg for PE 7/5.  CTH neg 7/9.  Covered with azithromycin/ ceftriaxone, switched to cefepime on 7/7.  Shock and AKI improved and extubated 7/7 but required BiPAP.  Required reintubation 7/10 for respiratory failure.  Increasing WBC 7/13, placed on doxycyline switched to vancomycin with elevated PCT with CXR showing increased bibasilar atelectasis vs infiltrates with increasing pleural effusions.  Initial blood culture x1 w/ stap epi, thought contaminate with repeat BC 7/7 negative.  Some concern for ileus with high OGT residuals with CT a/p showing slightly worse dilation of suspected chronic ileus with prior surgical changes but no transition point, possible emesis of TF since held, remains, NPO on reglan.  Patient has failed SBT for several days, due to apnea despite decreased sedation.  Transferred to [facility] ICU for further care, admitted to PCCM.  Pertinent  Medical History  Tobacco abuse,  COPD, chronic constipation, prior colectomy in 1990's 2/2 diverticulitis, fibromyalgia (on disability since 2008), depression, anxiety, neuropathy   Home meds> xanax (1mg  QID), lexapro, gabapentin, anoro elliptia, zofran  Significant Hospital Events: Including procedures, antibiotic start and stop dates in addition to other pertinent  events   7/5 admitted [facility] CTX and azith started 7/7 extubated. Vanc added and cefepime started; CTX stopped 7/8 vanc stopped 7/10 reintubated  7/16 tx to [facility]. Cefepime stopped 7/17 attempting lasix. Creatinine worse.  Echo EF 60-65%no WMA RV nml  7/18 cr worse after lasix. Weaning but on-going ileus and cr rising presenting obstacle to extubation Trying neostigmine.  7/19 scr worse in spite of holding lasix. Still on low dose precedex. Mental status and renal fxn still barrier to extubation. Nephro consulted > started CRRT.  HD catheter placed. 7/20  heparin stopped d/t bleeding around HD cath. Got DDAVP.  7/21 resumes cefepime for worsening leukocytosis.  7/22 volumes ok w/ SBT but has sig accessory use  7/23- Trach performed at bedside. CRRT holiday per nephrology. Increased Xanax to home dose 1mg  QID- added seroquel 25mg  BID 7/24- has successfully weaned off propofol and converted to precedex infusion. Tolerating 4+ hours of SBT 7/25- discontinued precedex and fentanyl with as needed fentanyl pushes for pain. Tolerated SBT for 3 hours before failing and being placed on full vent support. Oliguric with rising serum creatinine- nephrology requests transfer to [facility] for restarting dialysis  7/26 multiple emesis, NG to LIS, EEG neg , HD session 7/27 DC Seroquel, decrease Xanax 0.5 twice daily 7/28 Code stroke called for unequal pupils this a.m., head CT negative, seen by neurology; MRI neg 7/31 Hgb drop to 5.5, 2 u prbc, DIC panel  Interim History / Subjective:  Hgb drop, more purpura, labs keep hemolyzing  Objective   Blood pressure (!) 150/80, pulse 82, temperature 99.9 F (37.7 C), temperature source Oral, resp. rate 20, height 5\' 5"  (1.651 m),  weight 73.8 kg, SpO2 95%.      Vent Mode: PRVC FiO2 (%):  [40 %-50 %] 50 % Set Rate:  [20 bmp] 20 bmp Vt Set:  [450 mL] 450 mL PEEP:  [5 cmH20] 5 cmH20 Pressure Support:  [5 cmH20] 5 cmH20 Plateau Pressure:  [15 cmH20-18 cmH20] 18  cmH20   Intake/Output Summary (Last 24 hours) at 04/17/2023 1017 Last data filed at 04/17/2023 4098 Gross per 24 hour  Intake 874.17 ml  Output 3200 ml  Net -2325.83 ml   Filed Weights   04/16/23 1415 04/16/23 1815 04/17/23 0500  Weight: 73.6 kg 70.6 kg 73.8 kg    Examination: Breathing okay on PRVC Opens eyes, tracks, follows simple instructions Lung sounds diminished Scattered bruising Heart sounds regular  Assessment & Plan:   Admitted for community-acquired pneumonia/Moraxella, complicated by ileus and prolonged mechanical ventilation requiring tracheostomy  Baseline hx of COPD, severe anxiety  Protein calorie malnutrition POA  Septic/ischemic ATN on iHD  Coagulopathy  - Labs hemolyzed, try drawing from large port of HD cath and peripheral stick for comparison - wonder if there's a defect in CVL that's damaging draws - Microscopic eval for continued hemolysis - DIC panel - HIT workup - Continue PS trials: may be able to do TC today - Nebs as ordered - Continue iHD neg as tolerated - Avoid ileogenic medications (unfortunately still needing fent PRN) - TF - Continue PTA benzos: lower at bedtime gabapentin slightly  Best Practice (right click and "Reselect all SmartList Selections" daily)   Diet/type: challenge TF DVT prophylaxis: prophylactic heparin  GI prophylaxis: PPI Lines: Central line, Dialysis Catheter, and yes and it is still needed Foley:  Yes, and it is still needed Code Status:  DNR Last date of multidisciplinary goals of care discussion [04/09/2023]-planning for LTAC  Marrianne Mood MD 04/17/2023, 10:29 AM

## 2023-04-17 NOTE — Plan of Care (Signed)
IR was requested for G tube and triple lumen tunneled HD catheter.   Ordering MD was informed that IR does not have triple lumen tunneled HD catheter, if patient is in need of long term IV, IR can offer tunneled CVC.   For the G tube, CT A/P from 7/17 reviewed with Dr. Fredia Sorrow, needs repeat CT due to SBO seen in previous CT.  CT abdomen w/o contrast ordered.   Patient is currently too sick for the G tube placement with hgb in 5s, tunneled HD cath cannot be placed with pending blood cx.  IR will follow labs and determine when IR can proceed with G tube and tunneled HD cath placement next week.   Asked ordering MD to placed IR fluoro guided tunneled CV if tunneled CVC is needed.  Please call IR for questions and concerns.    Lynann Bologna  PA-C 04/17/2023 12:28 PM

## 2023-04-17 NOTE — Progress Notes (Signed)
  Critical Hemoglobin 5.5 called to [facility]

## 2023-04-17 NOTE — Progress Notes (Signed)
04/17/2023   Hgb confirmed low on peripheral draw Completely asymptomatic DIC panel unrevealing Will check evening CBC, if continues to drop may warrant CT A/P r/o RP bleed.  Myrla Halsted MD

## 2023-04-17 NOTE — Progress Notes (Signed)
Upon assessment cortrak noted to be at 58cm. Last charted at 78cm. Abdominal xray ordered per protocol. Radiologist recommends advancing the 6-7cm but the tip is in the stomach. Cortrak now clogged. Unable to push any meds or sterile water into tube.

## 2023-04-17 NOTE — Progress Notes (Signed)
Nutrition Follow-up  DOCUMENTATION CODES:  Not applicable  INTERVENTION:  Once access re-established resume the following: Pivot 1.5 at 50 ml/h (1200 ml per day) Provides 1800 kcal, 112 gm protein, 900 ml free water daily Renal MVI daily  NUTRITION DIAGNOSIS:  Inadequate oral intake related to inability to eat as evidenced by NPO status. - remains applicable   GOAL:  Patient will meet greater than or equal to 90% of their needs - progressing, being met with TF at goal  MONITOR:  Vent status, Labs, Weight trends  REASON FOR ASSESSMENT:  Consult Other (Comment) (advance cortrak 6-7 cm)  ASSESSMENT:  Pt with hx COPD, chronic constipation, colectomy (in the 1990's) due to diverticulitis, fibromyalgia, HLD, and depression who initially presented to [facility] for 1 week hx of URI symptoms admitted for respiratory failure due to RLL PNA and septic shock.  7/5: admitted [facility] 7/7: extubated 7/10: reintubated  7/16: tx to [facility], remains intubated  7/18: OGT replaced with small bore NG feeding tube 7/19: IR attempted NGT advancement to post-pyloric but unsuccessful tube left in stomach; Trickle feeds started; starting on CRRT 7/22: TF advanced to 84mL/hr 7/23 - trach placed, CRRT stopped 7/25 - transferred to [facility] for iHD. Vomiting on arrival, NGT placed for suction 7/28 - code stroke called, negative workup 8/1 - small bore tube clogged, removed  Pt resting in bed at the time of assessment. Discussed with RN, pt's exisiting small bore tube noted to be out of original position and XR showed it needed to be advanced. Also clogged. RN initiated unclogging protocol but unable to clear. Tube to be removed. Pt planned to go down for tunneled HD cath and family consented to PEG placement as pt will likely need long-term access. However, pt unable to go down to IR today due to low Hgb and pending blood cultures. Will replace with cortrak tube tomorrow until PEG can be completed.  Last HD  7/31 Net UF 3L  MV:9.4 L/min Temp (24hrs), Avg:99 F (37.2 C), Min:98.1 F (36.7 C), Max:101.3 F (38.5 C)   Intake/Output Summary (Last 24 hours) at 04/17/2023 0855 Last data filed at 04/17/2023 0400 Gross per 24 hour  Intake 659.17 ml  Output 3200 ml  Net -2540.83 ml  Net IO Since Admission: -10,593.21 mL [04/17/23 0855]  Nutritionally Relevant Medications: Scheduled Meds:  atorvastatin  20 mg Per Tube Daily   clonazePAM  0.5 mg Per Tube BID   docusate  100 mg Per Tube BID   insulin aspart  0-9 Units Subcutaneous Q4H   multivitamin  1 tablet Per Tube QHS   pantoprazole IV  40 mg Intravenous Q24H   polyethylene glycol  17 g Per NG tube Daily   Continuous Infusions:  sodium chloride Stopped (04/14/23 0852)   sodium chloride     feeding supplement (PIVOT 1.5 CAL) Stopped (04/16/23 2035)   ondansetron (ZOFRAN) IV Stopped (04/12/23 0920)   PRN Meds: bisacodyl, ondansetron, phenol  Labs Reviewed: Chloride 97 BUN 51, creatinine 5.22 Phosphorus 5.2 Mg 2.5 CBG ranges from 66-154 mg/dL over the last 24 hours  Diet Order:   Diet Order             Diet NPO time specified  Diet effective now                  EDUCATION NEEDS:  Not appropriate for education at this time  Skin: Skin Assessment: Reviewed RN Assessment Skin Integrity Issues:: Stage I Stage I: Left buttocks  Last  BM:  7/28 - type 7 FMS placed 7/28 output in the last 24h  Height:  Ht Readings from Last 1 Encounters:  04/01/23 5\' 5"  (1.651 m)   Weight:  Wt Readings from Last 1 Encounters:  04/17/23 73.8 kg   Ideal Body Weight:  56.82 kg  BMI:  Body mass index is 27.07 kg/m.  Estimated Nutritional Needs:  Kcal:  1800-2000 kcals Protein:  95-115 grams Fluid:  >/= 1.8L   Greig Castilla, RD, LDN Clinical Dietitian RD pager # available in [facility]  After hours/weekend pager # available in [facility]

## 2023-04-17 NOTE — Progress Notes (Signed)
ABG held at this time per MD

## 2023-04-18 ENCOUNTER — Inpatient Hospital Stay (HOSPITAL_COMMUNITY): Payer: 59

## 2023-04-18 DIAGNOSIS — J9601 Acute respiratory failure with hypoxia: Secondary | ICD-10-CM | POA: Diagnosis not present

## 2023-04-18 LAB — COOXEMETRY PANEL
Carboxyhemoglobin: 4.1 % — ABNORMAL HIGH (ref 0.5–1.5)
Methemoglobin: 3.1 % — ABNORMAL HIGH (ref 0.0–1.5)
O2 Saturation: 77.8 %
Total hemoglobin: 11.5 g/dL — ABNORMAL LOW (ref 12.0–16.0)

## 2023-04-18 LAB — COMPREHENSIVE METABOLIC PANEL
ALT: 26 U/L (ref 0–44)
AST: 127 U/L — ABNORMAL HIGH (ref 15–41)
Albumin: 2.4 g/dL — ABNORMAL LOW (ref 3.5–5.0)
Alkaline Phosphatase: 61 U/L (ref 38–126)
Anion gap: 16 — ABNORMAL HIGH (ref 5–15)
BUN: 110 mg/dL — ABNORMAL HIGH (ref 8–23)
CO2: 23 mmol/L (ref 22–32)
Calcium: 9.6 mg/dL (ref 8.9–10.3)
Chloride: 98 mmol/L (ref 98–111)
Creatinine, Ser: 6.24 mg/dL — ABNORMAL HIGH (ref 0.44–1.00)
GFR, Estimated: 7 mL/min — ABNORMAL LOW (ref >60)
Glucose, Bld: 133 mg/dL — ABNORMAL HIGH (ref 70–99)
Potassium: 4.3 mmol/L (ref 3.5–5.1)
Sodium: 137 mmol/L (ref 135–145)
Total Bilirubin: 0.2 mg/dL — ABNORMAL LOW (ref 0.3–1.2)
Total Protein: 6.1 g/dL — ABNORMAL LOW (ref 6.5–8.1)

## 2023-04-18 LAB — GLUCOSE, CAPILLARY
Glucose-Capillary: 110 mg/dL — ABNORMAL HIGH (ref 70–99)
Glucose-Capillary: 95 mg/dL (ref 70–99)
Glucose-Capillary: 115 mg/dL — ABNORMAL HIGH (ref 70–99)
Glucose-Capillary: 131 mg/dL — ABNORMAL HIGH (ref 70–99)
Glucose-Capillary: 159 mg/dL — ABNORMAL HIGH (ref 70–99)
Glucose-Capillary: 149 mg/dL — ABNORMAL HIGH (ref 70–99)

## 2023-04-18 LAB — LACTATE DEHYDROGENASE: LDH: 2275 U/L — ABNORMAL HIGH (ref 98–192)

## 2023-04-18 LAB — DIRECT ANTIGLOBULIN TEST (NOT AT ARMC)
DAT, IgG: POSITIVE
DAT, complement: NEGATIVE

## 2023-04-18 NOTE — Progress Notes (Signed)
Physical Therapy RE-Evaluation Patient Details Name: Tamara Castro MRN: [id number] DOB: Jun 16, 1961 Today's Date: 04/18/2023  History of Present Illness  62 yo female admitted to [facility] 7/5 with RLL PNA, sepsis and acute HF. Intubated (7/5-7/7, bipap 7/7-7/9, 7/10-present). 7/16 transfer to [facility]. 7/19-7/23 CRRT. 7/23 trach. 7/25 transfer to [facility] for HD. 7/26 EEG with encephalopathy. PMhx: COPD, tobacco abuse, ileus, anxiety, CAD  Clinical Impression  Pt awake, alert, attempting to communicate with mouthing words and head nods/shakes. Pt following commands with delay grossly 25% of the time for movement of all extremities and able to assist with pulling trunk off surface. Pt providing more info of home environment which will need to be confirmed with family. Pt encouraged to move extremities and assist with mobility. Plan and goals updated. Will continue to follow.     40% FIO2 PRVC, SPO2 97% HR 106      If plan is discharge home, recommend the following: Two people to help with walking and/or transfers;Two people to help with bathing/dressing/bathroom;Direct supervision/assist for medications management;Assistance with feeding;Assistance with cooking/housework;Direct supervision/assist for financial management;Assist for transportation;Help with stairs or ramp for entrance   Can travel by private vehicle   No    Equipment Recommendations Wheelchair cushion (measurements PT);Wheelchair (measurements PT);Hospital bed  Recommendations for Other Services       Functional Status Assessment Patient has had a recent decline in their functional status and demonstrates the ability to make significant improvements in function in a reasonable and predictable amount of time.     Precautions / Restrictions Precautions Precautions: Other (comment);Fall Precaution Comments: vent, trach, cortrak, flexiseal      Mobility  Bed Mobility Overal bed mobility: Needs Assistance Bed  Mobility: Supine to Sit     Supine to sit: Total assist     General bed mobility comments: total assist with bed in full chair egress, pt able to lift arms to rails with mod cues and increased time. Pulled trunk away from surface with max assist x 2 but limited by fatigue and only able to hold 3-5 sec prior to fatigue    Transfers                   General transfer comment: not yet ready    Ambulation/Gait                  Stairs            Wheelchair Mobility     Tilt Bed    Modified Rankin (Stroke Patients Only)       Balance Overall balance assessment: Needs assistance                                           Pertinent Vitals/Pain Pain Assessment Pain Assessment: CPOT Faces Pain Scale: No hurt Facial Expression: Relaxed, neutral Body Movements: Absence of movements Muscle Tension: Relaxed Compliance with ventilator (intubated pts.): Tolerating ventilator or movement Vocalization (extubated pts.): N/A CPOT Total: 0    Home Living Family/patient expects to be discharged to:: Private residence Living Arrangements: Children Available Help at Discharge: Family;Available 24 hours/day Type of Home: House Home Access: Stairs to enter   [employer] of Steps: 5   Home Layout: One level Home Equipment: None Additional Comments: per pt on vent with gestures and yes/no reponses, will need to further clarify when able to communicate more or  family present    Prior Function Prior Level of Function : Independent/Modified Independent;Driving;Working/employed                     Hand Dominance        Extremity/Trunk Assessment   Upper Extremity Assessment Upper Extremity Assessment: RUE deficits/detail;LUE deficits/detail RUE Deficits / Details: 2-/5 shoulder flexion, elbow flexion and extension, weak grip LUE Deficits / Details: 2-/5 shoulder flexion, elbow flexion and extension, weak grip    Lower  Extremity Assessment RLE Deficits / Details: 2/5 with pt able to perform assist LAQ and seated marching, AAROM WFL LLE Deficits / Details: 2/5 with pt able to perform assist LAQ and seated marching, AAROM WFL    Cervical / Trunk Assessment Cervical / Trunk Assessment: Other exceptions Cervical / Trunk Exceptions: right lateral cervical flexion (toward vent)  Communication   Communication: Tracheostomy  Cognition Arousal/Alertness: Awake/alert Behavior During Therapy: Flat affect Overall Cognitive Status: Difficult to assess                                 General Comments: pt shaking head yes/no providing limited hand gestures and trying to mouth words but very limited amount of things mouthed were discernable        General Comments      Exercises General Exercises - Upper Extremity Shoulder Flexion: AAROM, Both, 10 reps, Seated Elbow Flexion: AAROM, Both, 10 reps, Seated Elbow Extension: AAROM, Both, 10 reps, Seated General Exercises - Lower Extremity Long Arc Quad: AAROM, Both, Seated, 10 reps Hip Flexion/Marching: AAROM, Both, 10 reps, Seated   Assessment/Plan    PT Assessment Patient needs continued PT services  PT Problem List Decreased strength;Decreased coordination;Decreased range of motion;Decreased cognition;Cardiopulmonary status limiting activity;Decreased activity tolerance;Decreased balance;Decreased mobility;Decreased knowledge of precautions;Decreased safety awareness       PT Treatment Interventions Functional mobility training;Therapeutic activities;Patient/family education;Cognitive remediation;Neuromuscular re-education;Balance training;DME instruction;Therapeutic exercise    PT Goals (Current goals can be found in the Care Plan section)  Acute Rehab PT Goals Patient Stated Goal: go home PT Goal Formulation: With patient Time For Goal Achievement: 05/02/23 Potential to Achieve Goals: Fair    Frequency Min 1X/week     Co-evaluation                AM-PAC PT "6 Clicks" Mobility  Outcome Measure Help needed turning from your back to your side while in a flat bed without using bedrails?: Total Help needed moving from lying on your back to sitting on the side of a flat bed without using bedrails?: Total Help needed moving to and from a bed to a chair (including a wheelchair)?: Total Help needed standing up from a chair using your arms (e.g., wheelchair or bedside chair)?: Total Help needed to walk in hospital room?: Total Help needed climbing 3-5 steps with a railing? : Total 6 Click Score: 6    End of Session   Activity Tolerance: Patient tolerated treatment well Patient left: in bed;with call bell/phone within reach Nurse Communication: Mobility status;Need for lift equipment PT Visit Diagnosis: Other abnormalities of gait and mobility (R26.89);Difficulty in walking, not elsewhere classified (R26.2);Muscle weakness (generalized) (M62.81)    Time: 9811-9147 PT Time Calculation (min) (ACUTE ONLY): 19 min   Charges:   PT Evaluation $PT Re-evaluation: 1 Re-eval   PT General Charges $$ ACUTE PT VISIT: 1 Visit          P, PT Acute  Rehabilitation Services Office: [phone]   Cristine Polio 04/18/2023, 12:29 PM

## 2023-04-18 NOTE — Progress Notes (Signed)
Brief Nutrition Support Note  Cortrak placed this AM and terminates in the distal stomach  PEG placement on hold until pt able to be taken to IR. Per radiology note, likely next week once acute issues have resolved.  RN resumed previous TF order: Pivot 1.5 at 50 ml/h (1200 ml per day) Provides 1800 kcal, 112 gm protein, 900 ml free water daily  Greig Castilla, RD, LDN Clinical Dietitian RD pager # available in [facility]  After hours/weekend pager # available in [facility]

## 2023-04-18 NOTE — Procedures (Signed)
Cortrak  Person Inserting Tube:  Mahala Menghini, RD Tube Type:  Cortrak - 43 inches Tube Size:  10 Tube Location:  Left nare Secured by: Bridle Technique Used to Measure Tube Placement:  Marking at nare/corner of mouth Cortrak Secured At:  73 cm   Cortrak Tube Team Note:  Consult received to place a Cortrak feeding tube.   X-ray is required, abdominal x-ray has been ordered by the Cortrak team. Please confirm tube placement before using the Cortrak tube.   If the tube becomes dislodged please keep the tube and contact the Cortrak team at [email or web address] for replacement.  If after hours and replacement cannot be delayed, place a NG tube and confirm placement with an abdominal x-ray.    Mertie Clause, MS, RD, LDN Registered Dietitian II Please see [facility] for contact information.

## 2023-04-18 NOTE — Progress Notes (Signed)
SLP Cancellation Note  Patient Details Name: LILJA SOLAND MRN: [id number] DOB: 07/28/1961   Cancelled treatment: SLP following along in chart for readiness for PMV interventions. Pt does not appear to be appropriate candidate at this time. Will f/u next week.   L. Samson Frederic, MA CCC/SLP Clinical Specialist - Acute Care SLP Acute Rehabilitation Services Office number [phone]           Blenda Mounts Laurice 04/18/2023, 8:19 AM

## 2023-04-18 NOTE — Progress Notes (Signed)
NAME:  Tamara Castro, MRN:  [id number], DOB:  July 04, 1961, LOS: 17 ADMISSION DATE:  04/01/2023, CONSULTATION DATE:  04/01/2023 REFERRING MD:  Salvadore Dom, CHIEF COMPLAINT:  Respiratory Failure   History of Present Illness:  HPI obtained from EMR review as patient is intubated and sedated on MV.    66 yoF with PMH as below who presented to [facility] on 7/5 after 1 week hx of URI symptoms admitted for acute hypoxic and hypercarbic respiratory failure 2/2 RLL PNA requiring intubation, septic shock, AKI and acute heart failure.  Found to have pBNP of 49k with elevated troponin's thought secondary to demand ischemia with echo with normal EF, reduced RV, indeterminate diastolic function.  CTA neg for PE 7/5.  CTH neg 7/9.  Covered with azithromycin/ ceftriaxone, switched to cefepime on 7/7.  Shock and AKI improved and extubated 7/7 but required BiPAP.  Required reintubation 7/10 for respiratory failure.  Increasing WBC 7/13, placed on doxycyline switched to vancomycin with elevated PCT with CXR showing increased bibasilar atelectasis vs infiltrates with increasing pleural effusions.  Initial blood culture x1 w/ stap epi, thought contaminate with repeat BC 7/7 negative.  Some concern for ileus with high OGT residuals with CT a/p showing slightly worse dilation of suspected chronic ileus with prior surgical changes but no transition point, possible emesis of TF since held, remains, NPO on reglan.  Patient has failed SBT for several days, due to apnea despite decreased sedation.  Transferred to [facility] ICU for further care, admitted to PCCM.  Pertinent  Medical History  Tobacco abuse,  COPD, chronic constipation, prior colectomy in 1990's 2/2 diverticulitis, fibromyalgia (on disability since 2008), depression, anxiety, neuropathy   Home meds> xanax (1mg  QID), lexapro, gabapentin, anoro elliptia, zofran  Significant Hospital Events: Including procedures, antibiotic start and stop dates in addition to other pertinent  events   7/5 admitted [facility] CTX and azith started 7/7 extubated. Vanc added and cefepime started; CTX stopped 7/8 vanc stopped 7/10 reintubated  7/16 tx to [facility]. Cefepime stopped 7/17 attempting lasix. Creatinine worse.  Echo EF 60-65%no WMA RV nml  7/18 cr worse after lasix. Weaning but on-going ileus and cr rising presenting obstacle to extubation Trying neostigmine.  7/19 scr worse in spite of holding lasix. Still on low dose precedex. Mental status and renal fxn still barrier to extubation. Nephro consulted > started CRRT.  HD catheter placed. 7/20 [place] heparin stopped d/t bleeding around HD cath. Got DDAVP.  7/21 resumes cefepime for worsening leukocytosis.  7/22 volumes ok w/ SBT but has sig accessory use  7/23- Trach performed at bedside. CRRT holiday per nephrology. Increased Xanax to home dose 1mg  QID- added seroquel 25mg  BID 7/24- has successfully weaned off propofol and converted to precedex infusion. Tolerating 4+ hours of SBT 7/25- discontinued precedex and fentanyl with as needed fentanyl pushes for pain. Tolerated SBT for 3 hours before failing and being placed on full vent support. Oliguric with rising serum creatinine- nephrology requests transfer to [facility] for restarting dialysis  7/26 multiple emesis, NG to LIS, EEG neg , HD session 7/27 DC Seroquel, decrease Xanax 0.5 twice daily 7/28 Code stroke called for unequal pupils this a.m., head CT negative, seen by neurology; MRI neg 7/31 Hgb drop to 5.5, 2 u prbc, DIC panel  Interim History / Subjective:  Feeling okay, no new pain or discomfort, no new bleeding, still the problem with blood sample hemolysis from CVC  Objective   Blood pressure (!) 167/82, pulse 82, temperature 100 F (37.8  C), temperature source Oral, resp. rate 16, height 5\' 5"  (1.651 m), weight 75.3 kg, SpO2 95%.      Vent Mode: PRVC FiO2 (%):  [30 %-50 %] 30 % Set Rate:  [20 bmp] 20 bmp Vt Set:  [450 mL] 450 mL PEEP:  [5 cmH20] 5 cmH20 Plateau  Pressure:  [9 cmH20-23 cmH20] 16 cmH20   Intake/Output Summary (Last 24 hours) at 04/18/2023 5284 Last data filed at 04/17/2023 1800 Gross per 24 hour  Intake 1127 ml  Output 100 ml  Net 1027 ml   Filed Weights   04/16/23 1815 04/17/23 0500 04/18/23 0500  Weight: 70.6 kg 73.8 kg 75.3 kg    Examination: No distress Trach in place Heart rate and rhythm normal, extremities warm and well-perfused PRVC rate of 14, good breath sounds bilaterally Diffuse purpura predominantly on bilateral upper extremities No flank ecchymosis Alert and oriented, follows instructions, interactive  Assessment & Plan:   Admitted for community-acquired pneumonia/Moraxella - Stable from respiratory standpoint - Fever yesterday, increasing leukocytosis - Defer antibiotics, cultures pending  ?In-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 - Will draw labs via peripheral stick from now on  Ileus Protein calorie malnutrition POA - Seems resolved, tube feeds at goal now  Prolonged mechanical ventilation requiring tracheostomy - May be able to try some pressure support today  History of COPD - Triple therapy nebulizers  History of severe anxiety - Gabapentin 200 mg nightly - Clonazepam 0.5 mg twice daily  Septic/ischemic ATN on iHD - Appreciate nephrology assistance with this - Will need tunneled HD catheter prior to discharge  Best Practice (right click and "Reselect all SmartList Selections" daily)   Diet/type: TF DVT prophylaxis: prophylactic heparin  GI prophylaxis: PPI Lines: Central line, Dialysis Catheter, and yes and it is still  needed Foley:  Yes, and it is still needed Code Status:  DNR Last date of multidisciplinary goals of care discussion [04/09/2023]-planning for LTAC  Marrianne Mood MD 04/18/2023, 7:14 AM

## 2023-04-18 NOTE — Progress Notes (Signed)
[facility] Progress Note   Assessment/ Plan:   Acute kidney injury - b/l creat 1.0 from jan 2023. Creat here was 1.6 on admission in setting of pneumonia, hypotension and septic shock. Creat peaked at 1.7 and then returned to 1.0 on 7/12. Renal failure then worsened again up to 3.84 and CRRT started 7/19. AKI felt to be related to hypotension and IV contrast +/- diuretics. Has a two day CRRT holiday which showed min UOP.   - on MWF IHD  - converting nontunneled to tunneled HD cath when possible  - no heparin d/t possible HIT Sepsis/ shock/ hypotension - initial dx on admission was pna and septic shock, now resolved. BP's have been much better, last pressors were on 7/24.  Acute hypoxic respiratory failure - due to COPD/ pna, on vent w/ trach per CCM. F/u CXR 7/23 was negative for edema.  Volume excess/ lower extremity edema Anemia - Hb 7- 9 range, tranfuse prn. Hgb down to 5.5 yesterday getting blood and hemolysis workup- no report that we lost the system in HD- COPD Anxiety/ depression  Subjective:    Hemolysis workup ongoing.  For HD today.  No complaints.     Objective:   BP (!) 151/85   Pulse (!) 102   Temp 99.5 F (37.5 C) (Oral)   Resp (!) 22   Ht 5\' 5"  (1.651 m)   Wt 75.3 kg   SpO2 99%   BMI 27.62 kg/m   Intake/Output Summary (Last 24 hours) at 04/18/2023 1332 Last data filed at 04/18/2023 1200 Gross per 24 hour  Intake 435 ml  Output 100 ml  Net 335 ml   Weight change: 1.7 kg  Physical Exam: Gen:ill-appearing CVS: RRR Resp: + trach, coarse mech bilaterally Abd: + soft Ext: 3+ anasarca, UE greater than LE, improving ACCESS: R internal jugular nontunneled HD Cath  Imaging: DG Abd Portable 1V  Result Date: 04/18/2023 CLINICAL DATA:  Feeding tube placement. EXAM: PORTABLE ABDOMEN - 1 VIEW COMPARISON:  April 16, 2023. FINDINGS: Distal tip of feeding tube is seen in expected position of distal stomach. IMPRESSION: Distal tip of feeding tube seen in  expected position of distal stomach. Electronically Signed   By: Lupita Raider M.D.   On: 04/18/2023 10:16   DG Abd 1 View  Result Date: 04/16/2023 CLINICAL DATA:  NG tube EXAM: ABDOMEN - 1 VIEW COMPARISON:  Abdominal x-ray 04/11/2023 FINDINGS: Nasogastric tube tip is in the proximal body of the stomach. IMPRESSION: Nasogastric tube tip is in the proximal body of the stomach. Consider advancing tube 6-7 cm. Electronically Signed   By: Darliss Cheney M.D.   On: 04/16/2023 22:10    Labs: BMET Recent Labs  Lab 04/12/23 0427 04/13/23 0311 04/14/23 0521 04/15/23 0344 04/16/23 0421 04/17/23 0530  NA 136 134* 135 136 135 SPECIMEN HEMOLYZED. HEMOLYSIS MAY AFFECT INTEGRITY OF RESULTS.  K 3.4* 3.9 3.9 3.5 4.1 SPECIMEN HEMOLYZED. HEMOLYSIS MAY AFFECT INTEGRITY OF RESULTS.  CL 96* 94* 97* 96* 96* SPECIMEN HEMOLYZED. HEMOLYSIS MAY AFFECT INTEGRITY OF RESULTS.  CO2 25 23 25 26 27  SPECIMEN HEMOLYZED. HEMOLYSIS MAY AFFECT INTEGRITY OF RESULTS.  GLUCOSE 84 86 142* 168* 145* SPECIMEN HEMOLYZED. HEMOLYSIS MAY AFFECT INTEGRITY OF RESULTS.  BUN 19 34* 51* 32* 62* SPECIMEN HEMOLYZED. HEMOLYSIS MAY AFFECT INTEGRITY OF RESULTS.  CREATININE 2.34* 4.14* 5.22* 3.28* 4.53* SPECIMEN HEMOLYZED. HEMOLYSIS MAY AFFECT INTEGRITY OF RESULTS.  CALCIUM 8.4* 8.4* 8.5* 8.6* 8.3* SPECIMEN HEMOLYZED. HEMOLYSIS MAY AFFECT INTEGRITY OF RESULTS.  PHOS  3.1  --  5.2*  --   --   --    CBC Recent Labs  Lab 04/13/23 0311 04/13/23 0433 04/14/23 0521 04/15/23 0344 04/16/23 0421 04/17/23 0620 04/17/23 0832 04/17/23 1039 04/17/23 1142 04/17/23 2226 04/18/23 0541  WBC 9.7  --  8.4 10.3   < > 13.3*  --  13.1*  --  15.0* 15.7*  NEUTROABS 7.7  --  6.1 8.0*  --   --   --   --   --   --   --   HGB 6.9*   < > 7.7* 8.7*   < > 5.5*  --  6.3*  --  8.7* 8.3*  HCT 21.1*   < > 23.7* 26.3*   < > 13.9*  --  17.3*  --  25.0* 24.0*  MCV 95.0  --  92.6 93.9   < > 93.3  --  93.5  --  85.9 86.0  PLT 245  --  225 233   < > 166   < > 165 161  183 179   < > = values in this interval not displayed.    Medications:     sodium chloride   Intravenous Once   arformoterol  15 mcg Nebulization BID   atorvastatin  20 mg Per Tube Daily   budesonide (PULMICORT) nebulizer solution  0.5 mg Nebulization BID   Chlorhexidine Gluconate Cloth  6 each Topical Q0600   clonazePAM  0.5 mg Per Tube BID   docusate  100 mg Per Tube BID   escitalopram  10 mg Per Tube Daily   gabapentin  200 mg Per Tube QHS   Gerhardt's butt cream   Topical BID   insulin aspart  0-9 Units Subcutaneous Q4H   multivitamin  1 tablet Per Tube QHS   mouth rinse  15 mL Mouth Rinse Q2H   pantoprazole (PROTONIX) IV  40 mg Intravenous Q24H   polyethylene glycol  17 g Per NG tube Daily   revefenacin  175 mcg Nebulization Daily   sodium chloride flush  10-40 mL Intracatheter Q12H   sodium chloride flush  3 mL Intravenous Q12H    Bufford Buttner MD 04/18/2023, 1:32 PM

## 2023-04-19 ENCOUNTER — Inpatient Hospital Stay (HOSPITAL_COMMUNITY): Payer: 59

## 2023-04-19 DIAGNOSIS — J9601 Acute respiratory failure with hypoxia: Secondary | ICD-10-CM | POA: Diagnosis not present

## 2023-04-19 DIAGNOSIS — R7989 Other specified abnormal findings of blood chemistry: Secondary | ICD-10-CM

## 2023-04-19 LAB — COMPREHENSIVE METABOLIC PANEL
ALT: 28 U/L (ref 0–44)
AST: 80 U/L — ABNORMAL HIGH (ref 15–41)
Albumin: 2.5 g/dL — ABNORMAL LOW (ref 3.5–5.0)
Alkaline Phosphatase: 69 U/L (ref 38–126)
Anion gap: 17 — ABNORMAL HIGH (ref 5–15)
BUN: 58 mg/dL — ABNORMAL HIGH (ref 8–23)
CO2: 28 mmol/L (ref 22–32)
Calcium: 9.1 mg/dL (ref 8.9–10.3)
Chloride: 91 mmol/L — ABNORMAL LOW (ref 98–111)
Creatinine, Ser: 3.38 mg/dL — ABNORMAL HIGH (ref 0.44–1.00)
GFR, Estimated: 15 mL/min — ABNORMAL LOW (ref >60)
Glucose, Bld: 160 mg/dL — ABNORMAL HIGH (ref 70–99)
Potassium: 4.4 mmol/L (ref 3.5–5.1)
Sodium: 136 mmol/L (ref 135–145)
Total Bilirubin: 0.8 mg/dL (ref 0.3–1.2)
Total Protein: 6.2 g/dL — ABNORMAL LOW (ref 6.5–8.1)

## 2023-04-19 LAB — CBC
HCT: 28.5 % — ABNORMAL LOW (ref 36.0–46.0)
Hemoglobin: 9.5 g/dL — ABNORMAL LOW (ref 12.0–15.0)
MCH: 29.8 pg (ref 26.0–34.0)
MCHC: 33.3 g/dL (ref 30.0–36.0)
MCV: 89.3 fL (ref 80.0–100.0)
Platelets: 203 10*3/uL (ref 150–400)
RBC: 3.19 MIL/uL — ABNORMAL LOW (ref 3.87–5.11)
RDW: 18.3 % — ABNORMAL HIGH (ref 11.5–15.5)
WBC: 23.7 10*3/uL — ABNORMAL HIGH (ref 4.0–10.5)
nRBC: 0.2 % (ref 0.0–0.2)

## 2023-04-19 LAB — GLUCOSE, CAPILLARY
Glucose-Capillary: 147 mg/dL — ABNORMAL HIGH (ref 70–99)
Glucose-Capillary: 149 mg/dL — ABNORMAL HIGH (ref 70–99)
Glucose-Capillary: 137 mg/dL — ABNORMAL HIGH (ref 70–99)
Glucose-Capillary: 130 mg/dL — ABNORMAL HIGH (ref 70–99)
Glucose-Capillary: 134 mg/dL — ABNORMAL HIGH (ref 70–99)
Glucose-Capillary: 154 mg/dL — ABNORMAL HIGH (ref 70–99)

## 2023-04-19 LAB — DIC (DISSEMINATED INTRAVASCULAR COAGULATION)PANEL
D-Dimer, Quant: >20 ug/mL-FEU — ABNORMAL HIGH (ref 0.00–0.50)
Fibrinogen: 790 mg/dL — ABNORMAL HIGH (ref 210–475)
INR: 1 (ref 0.8–1.2)
Platelets: 206 10*3/uL (ref 150–400)
Prothrombin Time: 12.9 seconds (ref 11.4–15.2)
Smear Review: NONE SEEN
aPTT: 27 seconds (ref 24–36)

## 2023-04-19 LAB — LACTATE DEHYDROGENASE: LDH: 2103 U/L — ABNORMAL HIGH (ref 98–192)

## 2023-04-19 MED ORDER — HEPARIN SODIUM (PORCINE) 5000 UNIT/ML IJ SOLN
5000.0000 [IU] | Freq: Three times a day (TID) | INTRAMUSCULAR | Status: DC
  Administered 2023-04-19 – 2023-04-28 (×22): 5000 [IU] via SUBCUTANEOUS
  Filled 2023-04-19 (×22): qty 1

## 2023-04-19 MED ORDER — HEPARIN SODIUM (PORCINE) 1000 UNIT/ML IJ SOLN
INTRAMUSCULAR | Status: AC
  Filled 2023-04-19: qty 4

## 2023-04-19 NOTE — Progress Notes (Signed)
VASCULAR LAB    Bilateral lower extremity venous duplex has been performed.  See CV proc for preliminary results.   , , RVT 04/19/2023, 5:55 PM

## 2023-04-19 NOTE — Progress Notes (Signed)
Pt transported from 2M05 to CT and back on ventilator without any complications. RN and transport at bedside.

## 2023-04-19 NOTE — Progress Notes (Signed)
NAME:  Tamara Castro, MRN:  [id number], DOB:  Oct 05, 1960, LOS: 18 ADMISSION DATE:  04/01/2023, CONSULTATION DATE:  04/01/2023 REFERRING MD:  Salvadore Dom, CHIEF COMPLAINT:  Respiratory Failure   History of Present Illness:  HPI obtained from EMR review as patient is intubated and sedated on MV.    37 yoF with PMH as below who presented to [facility] on 7/5 after 1 week hx of URI symptoms admitted for acute hypoxic and hypercarbic respiratory failure 2/2 RLL PNA requiring intubation, septic shock, AKI and acute heart failure.  Found to have pBNP of 49k with elevated troponin's thought secondary to demand ischemia with echo with normal EF, reduced RV, indeterminate diastolic function.  CTA neg for PE 7/5.  CTH neg 7/9.  Covered with azithromycin/ ceftriaxone, switched to cefepime on 7/7.  Shock and AKI improved and extubated 7/7 but required BiPAP.  Required reintubation 7/10 for respiratory failure.  Increasing WBC 7/13, placed on doxycyline switched to vancomycin with elevated PCT with CXR showing increased bibasilar atelectasis vs infiltrates with increasing pleural effusions.  Initial blood culture x1 w/ stap epi, thought contaminate with repeat BC 7/7 negative.  Some concern for ileus with high OGT residuals with CT a/p showing slightly worse dilation of suspected chronic ileus with prior surgical changes but no transition point, possible emesis of TF since held, remains, NPO on reglan.  Patient has failed SBT for several days, due to apnea despite decreased sedation.  Transferred to [facility] ICU for further care, admitted to PCCM.  Pertinent  Medical History  Tobacco abuse,  COPD, chronic constipation, prior colectomy in 1990's 2/2 diverticulitis, fibromyalgia (on disability since 2008), depression, anxiety, neuropathy   Home meds> xanax (1mg  QID), lexapro, gabapentin, anoro elliptia, zofran  Significant Hospital Events: Including procedures, antibiotic start and stop dates in addition to other pertinent  events   7/5 admitted [facility] CTX and azith started 7/7 extubated. Vanc added and cefepime started; CTX stopped 7/8 vanc stopped 7/10 reintubated  7/16 tx to [facility]. Cefepime stopped 7/17 attempting lasix. Creatinine worse.  Echo EF 60-65%no WMA RV nml  7/18 cr worse after lasix. Weaning but on-going ileus and cr rising presenting obstacle to extubation Trying neostigmine.  7/19 scr worse in spite of holding lasix. Still on low dose precedex. Mental status and renal fxn still barrier to extubation. Nephro consulted > started CRRT.  HD catheter placed. 7/20  heparin stopped d/t bleeding around HD cath. Got DDAVP.  7/21 resumes cefepime for worsening leukocytosis.  7/22 volumes ok w/ SBT but has sig accessory use  7/23- Trach performed at bedside. CRRT holiday per nephrology. Increased Xanax to home dose 1mg  QID- added seroquel 25mg  BID 7/24- has successfully weaned off propofol and converted to precedex infusion. Tolerating 4+ hours of SBT 7/25- discontinued precedex and fentanyl with as needed fentanyl pushes for pain. Tolerated SBT for 3 hours before failing and being placed on full vent support. Oliguric with rising serum creatinine- nephrology requests transfer to [facility] for restarting dialysis  7/26 multiple emesis, NG to LIS, EEG neg , HD session 7/27 DC Seroquel, decrease Xanax 0.5 twice daily 7/28 Code stroke called for unequal pupils this a.m., head CT negative, seen by neurology; MRI neg 7/31 Hgb drop to 5.5, 2 u prbc, DIC panel  Interim History / Subjective:  Feeling okay, no discomfort, no pain, no fever.  Approximately 3 L of fluid off with HD yesterday.  Uptrending leukocytosis.  Objective   Blood pressure (!) 162/82, pulse (!) 104, temperature  98.4 F (36.9 C), temperature source Oral, resp. rate (!) 25, height 5\' 5"  (1.651 m), weight 75.3 kg, SpO2 100%.      Vent Mode: PSV;CPAP FiO2 (%):  [40 %] 40 % Set Rate:  [14 bmp-20 bmp] 14 bmp Vt Set:  [450 mL] 450 mL PEEP:  [5  cmH20] 5 cmH20 Pressure Support:  [12 cmH20] 12 cmH20 Plateau Pressure:  [14 cmH20-26 cmH20] 26 cmH20   Intake/Output Summary (Last 24 hours) at 04/19/2023 1610 Last data filed at 04/19/2023 0800 Gross per 24 hour  Intake 1075 ml  Output 3035 ml  Net -1960 ml   Filed Weights   04/16/23 1815 04/17/23 0500 04/18/23 0500  Weight: 70.6 kg 73.8 kg 75.3 kg    Examination: No distress Trach in place Heart rate and rhythm normal, extremities warm and well-perfused Pressure support 12/5 Diffuse purpura predominantly on bilateral upper extremities, not worsening Alert, answers questions and follows instructions, interactive  Assessment & Plan:   Admitted for community-acquired pneumonia/Moraxella - Stable from respiratory standpoint - Off antibiotics  Fever Leukocytosis - 101.3 2 days ago, afebrile since then - With uptrending leukocytosis 8>15>23 - Cultures drawn 8/1 are negative to date - Legs without signs of DVT, off ppx heparin x 2 days out of concern for HIT - Dimer elevated (not sure how to interpret this in setting of in-vitro hemolysis) - LE venous dopplers  In-vitro hemolysis - Labs from peripheral sites aren't hemolyzing, makes me think about a catheter defect or some other cause of shear when labs drawn from CVC - Hemoglobin and platelets are stable - Doubt TTP, DIC, HIT intravascular hemolysis - Draw from peripheral sites for labs  Ileus, resolved Protein calorie malnutrition POA - Tube feeds at goal now - Will need gastrostomy tube placed prior to discharge  Prolonged mechanical ventilation requiring tracheostomy - May be able to try some pressure support today, progress to trach collar as tolerated  History of COPD - Triple therapy nebulizers  History of severe anxiety - Gabapentin 200 mg nightly - Clonazepam 0.5 mg twice daily  Septic/ischemic ATN on iHD - Appreciate nephrology assistance with this - Will need tunneled HD catheter prior to  discharge  Best Practice (right click and "Reselect all SmartList Selections" daily)   Diet/type: TF DVT prophylaxis: restart heparin GI prophylaxis: PPI Lines: Central line, Dialysis Catheter, and yes and it is still needed Foley:  Yes, and it is still needed Code Status:  DNR Last date of multidisciplinary goals of care discussion [04/09/2023]-planning for LTAC  Marrianne Mood MD 04/19/2023, 8:26 AM

## 2023-04-19 NOTE — Progress Notes (Signed)
[facility] Progress Note   Assessment/ Plan:   Acute kidney injury - b/l creat 1.0 from jan 2023. Creat here was 1.6 on admission in setting of pneumonia, hypotension and septic shock. Creat peaked at 1.7 and then returned to 1.0 on 7/12. Renal failure then worsened again up to 3.84 and CRRT started 7/19. AKI felt to be related to hypotension and IV contrast +/- diuretics. Has a two day CRRT holiday which showed min UOP.   - on MWF IHD  - converting nontunneled to tunneled HD cath when possible  - no heparin d/t possible HIT Sepsis/ shock/ hypotension - initial dx on admission was pna and septic shock, now resolved. BP's have been much better, last pressors were on 7/24.  Acute hypoxic respiratory failure - due to COPD/ pna, on vent w/ trach per CCM. F/u CXR 7/23 was negative for edema.  Volume excess/ lower extremity edema Anemia - Hb 7- 9 range, tranfuse prn. Hemolysis workup ongoing. COPD Anxiety/ depression  Subjective:    HD yesterday- did well.     Objective:   BP (!) 141/85   Pulse (!) 102   Temp 98.4 F (36.9 C) (Oral)   Resp (!) 22   Ht 5\' 5"  (1.651 m)   Wt 75.3 kg   SpO2 99%   BMI 27.62 kg/m   Intake/Output Summary (Last 24 hours) at 04/19/2023 1042 Last data filed at 04/19/2023 0900 Gross per 24 hour  Intake 1125 ml  Output 3035 ml  Net -1910 ml   Weight change:   Physical Exam: Gen:ill-appearing CVS: RRR Resp: + trach, coarse mech bilaterally Abd: + soft Ext: 1+ anasarca, UE greater than LE, improving ACCESS: R internal jugular nontunneled HD Cath  Imaging: DG Abd Portable 1V  Result Date: 04/18/2023 CLINICAL DATA:  Feeding tube placement. EXAM: PORTABLE ABDOMEN - 1 VIEW COMPARISON:  April 16, 2023. FINDINGS: Distal tip of feeding tube is seen in expected position of distal stomach. IMPRESSION: Distal tip of feeding tube seen in expected position of distal stomach. Electronically Signed   By: Lupita Raider M.D.   On: 04/18/2023 10:16     Labs: BMET Recent Labs  Lab 04/13/23 0311 04/14/23 0521 04/15/23 0344 04/16/23 0421 04/17/23 0530 04/18/23 1232 04/19/23 0832  NA 134* 135 136 135 SPECIMEN HEMOLYZED. HEMOLYSIS MAY AFFECT INTEGRITY OF RESULTS. 137 136  K 3.9 3.9 3.5 4.1 SPECIMEN HEMOLYZED. HEMOLYSIS MAY AFFECT INTEGRITY OF RESULTS. 4.3 4.4  CL 94* 97* 96* 96* SPECIMEN HEMOLYZED. HEMOLYSIS MAY AFFECT INTEGRITY OF RESULTS. 98 91*  CO2 23 25 26 27  SPECIMEN HEMOLYZED. HEMOLYSIS MAY AFFECT INTEGRITY OF RESULTS. 23 28  GLUCOSE 86 142* 168* 145* SPECIMEN HEMOLYZED. HEMOLYSIS MAY AFFECT INTEGRITY OF RESULTS. 133* 160*  BUN 34* 51* 32* 62* SPECIMEN HEMOLYZED. HEMOLYSIS MAY AFFECT INTEGRITY OF RESULTS. 110* 58*  CREATININE 4.14* 5.22* 3.28* 4.53* SPECIMEN HEMOLYZED. HEMOLYSIS MAY AFFECT INTEGRITY OF RESULTS. 6.24* 3.38*  CALCIUM 8.4* 8.5* 8.6* 8.3* SPECIMEN HEMOLYZED. HEMOLYSIS MAY AFFECT INTEGRITY OF RESULTS. 9.6 9.1  PHOS  --  5.2*  --   --   --   --   --    CBC Recent Labs  Lab 04/13/23 0311 04/13/23 0433 04/14/23 0521 04/15/23 0344 04/16/23 0421 04/17/23 1039 04/17/23 1142 04/17/23 2226 04/18/23 0541 04/19/23 0648  WBC 9.7  --  8.4 10.3   < > 13.1*  --  15.0* 15.7* 23.7*  NEUTROABS 7.7  --  6.1 8.0*  --   --   --   --   --   --  HGB 6.9*   < > 7.7* 8.7*   < > 6.3*  --  8.7* 8.3* 9.5*  HCT 21.1*   < > 23.7* 26.3*   < > 17.3*  --  25.0* 24.0* 28.5*  MCV 95.0  --  92.6 93.9   < > 93.5  --  85.9 86.0 89.3  PLT 245  --  225 233   < > 165 161 183 179 206  203   < > = values in this interval not displayed.    Medications:     arformoterol  15 mcg Nebulization BID   atorvastatin  20 mg Per Tube Daily   budesonide (PULMICORT) nebulizer solution  0.5 mg Nebulization BID   Chlorhexidine Gluconate Cloth  6 each Topical Q0600   clonazePAM  0.5 mg Per Tube BID   docusate  100 mg Per Tube BID   escitalopram  10 mg Per Tube Daily   gabapentin  200 mg Per Tube QHS   Gerhardt's butt cream   Topical BID   heparin  injection (subcutaneous)  5,000 Units Subcutaneous Q8H   heparin sodium (porcine)       insulin aspart  0-9 Units Subcutaneous Q4H   multivitamin  1 tablet Per Tube QHS   mouth rinse  15 mL Mouth Rinse Q2H   pantoprazole (PROTONIX) IV  40 mg Intravenous Q24H   polyethylene glycol  17 g Per NG tube Daily   revefenacin  175 mcg Nebulization Daily   sodium chloride flush  10-40 mL Intracatheter Q12H   sodium chloride flush  3 mL Intravenous Q12H    Bufford Buttner MD 04/19/2023, 10:42 AM

## 2023-04-20 DIAGNOSIS — J9601 Acute respiratory failure with hypoxia: Secondary | ICD-10-CM | POA: Diagnosis not present

## 2023-04-20 LAB — CBC
HCT: 25.5 % — ABNORMAL LOW (ref 36.0–46.0)
Hemoglobin: 8.2 g/dL — ABNORMAL LOW (ref 12.0–15.0)
MCH: 29.3 pg (ref 26.0–34.0)
MCHC: 32.2 g/dL (ref 30.0–36.0)
MCV: 91.1 fL (ref 80.0–100.0)
Platelets: 210 10*3/uL (ref 150–400)
RBC: 2.8 MIL/uL — ABNORMAL LOW (ref 3.87–5.11)
RDW: 18.2 % — ABNORMAL HIGH (ref 11.5–15.5)
WBC: 16.7 10*3/uL — ABNORMAL HIGH (ref 4.0–10.5)
nRBC: 0.1 % (ref 0.0–0.2)

## 2023-04-20 LAB — BASIC METABOLIC PANEL WITH GFR
Anion gap: 14 (ref 5–15)
BUN: 87 mg/dL — ABNORMAL HIGH (ref 8–23)
CO2: 27 mmol/L (ref 22–32)
Calcium: 8.7 mg/dL — ABNORMAL LOW (ref 8.9–10.3)
Chloride: 94 mmol/L — ABNORMAL LOW (ref 98–111)
Creatinine, Ser: 4.38 mg/dL — ABNORMAL HIGH (ref 0.44–1.00)
GFR, Estimated: 11 mL/min — ABNORMAL LOW (ref >60)
Glucose, Bld: 148 mg/dL — ABNORMAL HIGH (ref 70–99)
Potassium: 4.5 mmol/L (ref 3.5–5.1)
Sodium: 135 mmol/L (ref 135–145)

## 2023-04-20 LAB — HAPTOGLOBIN: Haptoglobin: <10 mg/dL — ABNORMAL LOW (ref 37–355)

## 2023-04-20 LAB — GLUCOSE, CAPILLARY
Glucose-Capillary: 124 mg/dL — ABNORMAL HIGH (ref 70–99)
Glucose-Capillary: 133 mg/dL — ABNORMAL HIGH (ref 70–99)
Glucose-Capillary: 133 mg/dL — ABNORMAL HIGH (ref 70–99)
Glucose-Capillary: 135 mg/dL — ABNORMAL HIGH (ref 70–99)
Glucose-Capillary: 142 mg/dL — ABNORMAL HIGH (ref 70–99)
Glucose-Capillary: 143 mg/dL — ABNORMAL HIGH (ref 70–99)

## 2023-04-20 NOTE — Progress Notes (Signed)
[name] Progress Note   Assessment/ Plan:   Acute kidney injury - b/l creat 1.0 from jan 2023. Creat here was 1.6 on admission in setting of pneumonia, hypotension and septic shock. Creat peaked at 1.7 and then returned to 1.0 on 7/12. Renal failure then worsened again up to 3.84 and CRRT started 7/19. AKI felt to be related to hypotension and IV contrast +/- diuretics. Has a two day CRRT holiday which showed min UOP.   - on MWF IHDnext 04/21/23  - converting nontunneled to tunneled HD cath when possible  - OK for heparin- no evidence of HIT, in vitro hemolysis Sepsis/ shock/ hypotension - initial dx on admission was pna and septic shock, now resolved. BP's have been much better, last pressors were on 7/24.  Acute hypoxic respiratory failure - due to COPD/ pna, on vent w/ trach per CCM. F/u CXR 7/23 was negative for edema.  Volume excess/ lower extremity edema Anemia - Hb 7- 9 range, tranfuse prn. Hemolysis seems to be in vitro COPD Anxiety/ depression  Subjective:    Seen in room.  Doing well, more awake and alert.     Objective:   BP (!) 141/75   Pulse (!) 120   Temp 99.1 F (37.3 C) (Oral)   Resp (!) 26   Ht 5\' 5"  (1.651 m)   Wt 74.9 kg   SpO2 97%   BMI 27.48 kg/m   Intake/Output Summary (Last 24 hours) at 04/20/2023 1152 Last data filed at 04/20/2023 1000 Gross per 24 hour  Intake 1153 ml  Output 390 ml  Net 763 ml   Weight change:   Physical Exam: Gen:ill-appearing CVS: RRR Resp: + trach, coarse mech bilaterally Abd: + soft Ext: 1+ anasarca, UE greater than LE, improving ACCESS: R internal jugular nontunneled HD Cath  Imaging: VAS [place] LOWER EXTREMITY VENOUS (DVT)  Result Date: 04/19/2023  Lower Venous DVT Study Patient Name:  Tamara Castro  Date of Exam:   04/19/2023 Medical Rec #: [id number]            Accession #:    [id number] Date of Birth: 23-Feb-1961           Patient Gender: F Patient Age:   62 years Exam Location:  [facility]  Procedure:      VAS [place] LOWER EXTREMITY VENOUS (DVT) Referring Phys: Levy Pupa --------------------------------------------------------------------------------  Indications: Fever and elevated D-Dimer.  Limitations: Ventilation. Comparison Study: No prior study on file Performing Technologist: Sherren Kerns RVS  Examination Guidelines: A complete evaluation includes B-mode imaging, spectral Doppler, color Doppler, and power Doppler as needed of all accessible portions of each vessel. Bilateral testing is considered an integral part of a complete examination. Limited examinations for reoccurring indications may be performed as noted. The reflux portion of the exam is performed with the patient in reverse Trendelenburg.  +---------+---------------+---------+-----------+----------+-------------------+ RIGHT    CompressibilityPhasicitySpontaneityPropertiesThrombus Aging      +---------+---------------+---------+-----------+----------+-------------------+ CFV      Full           Yes      Yes                                      +---------+---------------+---------+-----------+----------+-------------------+ SFJ      Full                                                             +---------+---------------+---------+-----------+----------+-------------------+  FV Prox  Full                                                             +---------+---------------+---------+-----------+----------+-------------------+ FV Mid   Full                                                             +---------+---------------+---------+-----------+----------+-------------------+ FV DistalFull                                                             +---------+---------------+---------+-----------+----------+-------------------+ PFV      Full                                                             +---------+---------------+---------+-----------+----------+-------------------+  POP                     Yes      Yes                  patent by color and                                                       Doppler             +---------+---------------+---------+-----------+----------+-------------------+ PTV                                                   patent by color     +---------+---------------+---------+-----------+----------+-------------------+ PERO                                                  Not well visualized +---------+---------------+---------+-----------+----------+-------------------+   +---------+---------------+---------+-----------+----------+-------------------+ LEFT     CompressibilityPhasicitySpontaneityPropertiesThrombus Aging      +---------+---------------+---------+-----------+----------+-------------------+ CFV      Full           Yes      Yes                                      +---------+---------------+---------+-----------+----------+-------------------+ SFJ      Full                                                             +---------+---------------+---------+-----------+----------+-------------------+  FV Prox  Full                                                             +---------+---------------+---------+-----------+----------+-------------------+ FV Mid   Full                                                             +---------+---------------+---------+-----------+----------+-------------------+ FV DistalFull                                                             +---------+---------------+---------+-----------+----------+-------------------+ PFV      Full                                                             +---------+---------------+---------+-----------+----------+-------------------+ POP      Full           Yes      Yes                                      +---------+---------------+---------+-----------+----------+-------------------+  PTV      Full                                                             +---------+---------------+---------+-----------+----------+-------------------+ PERO                                                  Not well visualized +---------+---------------+---------+-----------+----------+-------------------+     Summary: RIGHT: - There is no evidence of deep vein thrombosis in the lower extremity. However, portions of this examination were limited- see technologist comments above.  LEFT: - There is no evidence of deep vein thrombosis in the lower extremity. However, portions of this examination were limited- see technologist comments above.  *See table(s) above for measurements and observations.    Preliminary    CT ABDOMEN WO CONTRAST  Result Date: 04/19/2023 CLINICAL DATA:  Anatomy eval for possible G tube palcement. Please assign to IR radiologist. EXAM: CT ABDOMEN WITHOUT CONTRAST TECHNIQUE: Multidetector CT imaging of the abdomen was performed following the standard protocol without IV contrast. RADIATION DOSE REDUCTION: This exam was performed according to the departmental dose-optimization program which includes automated exposure control, adjustment of the mA and/or kV according to patient size and/or use of iterative reconstruction technique. COMPARISON:  04/02/2023 FINDINGS:  Lower chest: Near complete resolution of pleural effusions. Pulmonary emphysema with some dependent atelectasis or consolidation posteriorly in the lung bases. Hepatobiliary: No focal liver abnormality is seen. Status post cholecystectomy. No biliary dilatation. Pancreas: Unremarkable. No pancreatic ductal dilatation or surrounding inflammatory changes. Spleen: Normal in size without focal abnormality. Adrenals/Urinary Tract: No adrenal mass. 4.8 cm 7 HU mid right renal probable cyst as before, requires no follow-up. No urolithiasis or hydronephrosis. Stomach/Bowel: Weighted tip feeding tube extends to the gastric  antrum. Stomach partially distended. There is safe anterior window for percutaneous gastrostomy placement. Visualized small bowel and colon unremarkable. Vascular/Lymphatic: Scattered aortoiliac calcified plaque without aneurysm. No abdominal or retroperitoneal adenopathy. Other: No ascites.  No free air. Musculoskeletal: Mild multilevel spondylitic change in the lumbar spine. No fracture or worrisome bone lesion. IMPRESSION: 1. There is safe  window for percutaneous gastrostomy placement. 2. Near complete resolution of pleural effusions. 3. Aortic Atherosclerosis (ICD10-I70.0) and Emphysema (ICD10-J43.9). Electronically Signed   By: Corlis Leak M.D.   On: 04/19/2023 15:34    Labs: BMET Recent Labs  Lab 04/14/23 0521 04/15/23 0344 04/16/23 0421 04/17/23 0530 04/18/23 1232 04/19/23 0832 04/20/23 0022  NA 135 136 135 SPECIMEN HEMOLYZED. HEMOLYSIS MAY AFFECT INTEGRITY OF RESULTS. 137 136 135  K 3.9 3.5 4.1 SPECIMEN HEMOLYZED. HEMOLYSIS MAY AFFECT INTEGRITY OF RESULTS. 4.3 4.4 4.5  CL 97* 96* 96* SPECIMEN HEMOLYZED. HEMOLYSIS MAY AFFECT INTEGRITY OF RESULTS. 98 91* 94*  CO2 25 26 27  SPECIMEN HEMOLYZED. HEMOLYSIS MAY AFFECT INTEGRITY OF RESULTS. 23 28 27   GLUCOSE 142* 168* 145* SPECIMEN HEMOLYZED. HEMOLYSIS MAY AFFECT INTEGRITY OF RESULTS. 133* 160* 148*  BUN 51* 32* 62* SPECIMEN HEMOLYZED. HEMOLYSIS MAY AFFECT INTEGRITY OF RESULTS. 110* 58* 87*  CREATININE 5.22* 3.28* 4.53* SPECIMEN HEMOLYZED. HEMOLYSIS MAY AFFECT INTEGRITY OF RESULTS. 6.24* 3.38* 4.38*  CALCIUM 8.5* 8.6* 8.3* SPECIMEN HEMOLYZED. HEMOLYSIS MAY AFFECT INTEGRITY OF RESULTS. 9.6 9.1 8.7*  PHOS 5.2*  --   --   --   --   --   --    CBC Recent Labs  Lab 04/14/23 0521 04/15/23 0344 04/16/23 0421 04/17/23 2226 04/18/23 0541 04/19/23 0648 04/20/23 0022  WBC 8.4 10.3   < > 15.0* 15.7* 23.7* 16.7*  NEUTROABS 6.1 8.0*  --   --   --   --   --   HGB 7.7* 8.7*   < > 8.7* 8.3* 9.5* 8.2*  HCT 23.7* 26.3*   < > 25.0* 24.0* 28.5* 25.5*   MCV 92.6 93.9   < > 85.9 86.0 89.3 91.1  PLT 225 233   < > 183 179 206  203 210   < > = values in this interval not displayed.    Medications:     arformoterol  15 mcg Nebulization BID   atorvastatin  20 mg Per Tube Daily   budesonide (PULMICORT) nebulizer solution  0.5 mg Nebulization BID   Chlorhexidine Gluconate Cloth  6 each Topical Q0600   clonazePAM  0.5 mg Per Tube BID   docusate  100 mg Per Tube BID   escitalopram  10 mg Per Tube Daily   gabapentin  200 mg Per Tube QHS   Gerhardt's butt cream   Topical BID   heparin injection (subcutaneous)  5,000 Units Subcutaneous Q8H   insulin aspart  0-9 Units Subcutaneous Q4H   multivitamin  1 tablet Per Tube QHS   mouth rinse  15 mL Mouth Rinse Q2H   pantoprazole (PROTONIX) IV  40 mg Intravenous Q24H  polyethylene glycol  17 g Per NG tube Daily   revefenacin  175 mcg Nebulization Daily   sodium chloride flush  10-40 mL Intracatheter Q12H   sodium chloride flush  3 mL Intravenous Q12H    Bufford Buttner MD 04/20/2023, 11:52 AM

## 2023-04-20 NOTE — Progress Notes (Signed)
Pt switched back to documented full vent support setting to rest for the night

## 2023-04-20 NOTE — Progress Notes (Signed)
Called to room d/t pt desat. Pt w/ sats of 83% upon arrival. Pt ets'd and placed on 100% fio2 w/o sats rising >92%. Pt placed back on documented settings.

## 2023-04-20 NOTE — Progress Notes (Signed)
RT took pt off ventilator and placed pt on ATC 12L/40% per MD, sats currently 95%. Pt is tolerating it well at this time, vent is @ bedside. RT will continue to monitor.

## 2023-04-20 NOTE — Progress Notes (Signed)
NAME:  Tamara Castro, MRN:  [id number], DOB:  1960/12/24, LOS: 19 ADMISSION DATE:  04/01/2023, CONSULTATION DATE:  04/01/2023 REFERRING MD:  Salvadore Dom, CHIEF COMPLAINT:  Respiratory Failure   History of Present Illness:  HPI obtained from EMR review as patient is intubated and sedated on MV.    48 yoF with PMH as below who presented to [facility] on 7/5 after 1 week hx of URI symptoms admitted for acute hypoxic and hypercarbic respiratory failure 2/2 RLL PNA requiring intubation, septic shock, AKI and acute heart failure.  Found to have pBNP of 49k with elevated troponin's thought secondary to demand ischemia with echo with normal EF, reduced RV, indeterminate diastolic function.  CTA neg for PE 7/5.  CTH neg 7/9.  Covered with azithromycin/ ceftriaxone, switched to cefepime on 7/7.  Shock and AKI improved and extubated 7/7 but required BiPAP.  Required reintubation 7/10 for respiratory failure.  Increasing WBC 7/13, placed on doxycyline switched to vancomycin with elevated PCT with CXR showing increased bibasilar atelectasis vs infiltrates with increasing pleural effusions.  Initial blood culture x1 w/ stap epi, thought contaminate with repeat BC 7/7 negative.  Some concern for ileus with high OGT residuals with CT a/p showing slightly worse dilation of suspected chronic ileus with prior surgical changes but no transition point, possible emesis of TF since held, remains, NPO on reglan.  Patient has failed SBT for several days, due to apnea despite decreased sedation.  Transferred to [facility] ICU for further care, admitted to PCCM.  Pertinent  Medical History  Tobacco abuse,  COPD, chronic constipation, prior colectomy in 1990's 2/2 diverticulitis, fibromyalgia (on disability since 2008), depression, anxiety, neuropathy   Home meds> xanax (1mg  QID), lexapro, gabapentin, anoro elliptia, zofran  Significant Hospital Events: Including procedures, antibiotic start and stop dates in addition to other pertinent  events   7/5 admitted [facility] CTX and azith started 7/7 extubated. Vanc added and cefepime started; CTX stopped 7/8 vanc stopped 7/10 reintubated  7/16 tx to [facility]. Cefepime stopped 7/17 attempting lasix. Creatinine worse.  Echo EF 60-65%no WMA RV nml  7/18 cr worse after lasix. Weaning but on-going ileus and cr rising presenting obstacle to extubation Trying neostigmine.  7/19 scr worse in spite of holding lasix. Still on low dose precedex. Mental status and renal fxn still barrier to extubation. Nephro consulted > started CRRT.  HD catheter placed. 7/20 [place] heparin stopped d/t bleeding around HD cath. Got DDAVP.  7/21 resumes cefepime for worsening leukocytosis.  7/22 volumes ok w/ SBT but has sig accessory use  7/23- Trach performed at bedside. CRRT holiday per nephrology. Increased Xanax to home dose 1mg  QID- added seroquel 25mg  BID 7/24- has successfully weaned off propofol and converted to precedex infusion. Tolerating 4+ hours of SBT 7/25- discontinued precedex and fentanyl with as needed fentanyl pushes for pain. Tolerated SBT for 3 hours before failing and being placed on full vent support. Oliguric with rising serum creatinine- nephrology requests transfer to [facility] for restarting dialysis  7/26 multiple emesis, NG to LIS, EEG neg , HD session 7/27 DC Seroquel, decrease Xanax 0.5 twice daily 7/28 Code stroke called for unequal pupils this a.m., head CT negative, seen by neurology; MRI neg 7/31 Hgb drop to 5.5, 2 u prbc, DIC panel Lower extremity Dopplers 8/3 >>  CT abdomen 8/3 >> near complete resolution of pleural effusions, there is a safe window for PEG tube placement  Interim History / Subjective:   Tolerated PSV for several hours on 8/3  CT abdomen performed as above Lower extremity Doppler8/3 >>  no evidence of lower extremity DVT WBC improved to 16.7 No fever    Objective   Blood pressure 134/69, pulse (!) 122, temperature 100 F (37.8 C), temperature source Axillary,  resp. rate (!) 26, height 5\' 5"  (1.651 m), weight 74.9 kg, SpO2 97%.      Vent Mode: PRVC FiO2 (%):  [40 %] 40 % Set Rate:  [14 bmp] 14 bmp Vt Set:  [450 mL] 450 mL PEEP:  [5 cmH20] 5 cmH20 Pressure Support:  [12 cmH20] 12 cmH20 Plateau Pressure:  [20 cmH20-26 cmH20] 22 cmH20   Intake/Output Summary (Last 24 hours) at 04/20/2023 0710 Last data filed at 04/20/2023 0600 Gross per 24 hour  Intake 1153 ml  Output 355 ml  Net 798 ml   Filed Weights   04/17/23 0500 04/18/23 0500 04/20/23 0349  Weight: 73.8 kg 75.3 kg 74.9 kg    Examination: Comfortable in bed, ventilated Trach in place, oropharynx clear, somewhat dry.  NG tube in place Regular, distant, no murmur.  No significant edema Small breaths, decreased to both bases, no wheezes or crackles.  Currently tolerating PSV Bilateral upper extremity purpura, stable Awake, alert, follows commands  Assessment & Plan:   Admitted for community-acquired pneumonia/Moraxella, treatment completed -Pulmonary hygiene, following off antibiotics  Fever, afebrile last 24 hours (8/3-4) Leukocytosis, improving.  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  Stable 8/4 -Low suspicion DIC  Ileus, resolved Protein calorie malnutrition POA -Continue tube feeding -CT abdomen performed, she has a good approach for PEG tube placement.  Plan for this week  Prolonged mechanical ventilation requiring tracheostomy -Continue to push SBT, PSV. -May be able to try some ATC -Continue pulmonary hygiene, aspiration precautions  History of COPD -Continue BD, ICS as ordered  History of severe anxiety -Continue gabapentin 200 mg nightly -Continue clonazepam 0.5 mg twice daily  Septic/ischemic ATN on iHD -Now tolerating intermittent HD.  Appreciate nephrology  management -Will need tunneled HD catheter prior to transfer from ICU   Best Practice (right click and "Reselect all SmartList Selections" daily)   Diet/type: TF DVT prophylaxis: restart heparin GI prophylaxis: PPI Lines: Central line, Dialysis Catheter, and yes and it is still needed Foley:  Yes, and it is still needed Code Status:  DNR Last date of multidisciplinary goals of care discussion [04/09/2023]-planning for LTAC   Levy Pupa, MD, PhD 04/20/2023, 7:17 AM [facility] [phone] or if no answer before 7:00PM call [phone] For any issues after 7:00PM please call eLink [phone]

## 2023-04-21 ENCOUNTER — Inpatient Hospital Stay (HOSPITAL_COMMUNITY): Payer: 59

## 2023-04-21 DIAGNOSIS — J9601 Acute respiratory failure with hypoxia: Secondary | ICD-10-CM | POA: Diagnosis not present

## 2023-04-21 LAB — CULTURE, RESPIRATORY W GRAM STAIN: Culture: NORMAL

## 2023-04-21 LAB — GLUCOSE, CAPILLARY
Glucose-Capillary: 123 mg/dL — ABNORMAL HIGH (ref 70–99)
Glucose-Capillary: 136 mg/dL — ABNORMAL HIGH (ref 70–99)
Glucose-Capillary: 142 mg/dL — ABNORMAL HIGH (ref 70–99)
Glucose-Capillary: 157 mg/dL — ABNORMAL HIGH (ref 70–99)
Glucose-Capillary: 180 mg/dL — ABNORMAL HIGH (ref 70–99)
Glucose-Capillary: 91 mg/dL (ref 70–99)

## 2023-04-21 MED ORDER — SODIUM CHLORIDE 0.9 % IV SOLN: 2.0000 g | Freq: Once | INTRAVENOUS | Status: DC

## 2023-04-21 MED ORDER — HEPARIN SODIUM (PORCINE) 1000 UNIT/ML IJ SOLN
INTRAMUSCULAR | Status: AC
  Administered 2023-04-21: 2000 [IU]
  Filled 2023-04-21: qty 2

## 2023-04-21 MED ORDER — DARBEPOETIN ALFA 150 MCG/0.3ML IJ SOSY
150.0000 ug | PREFILLED_SYRINGE | INTRAMUSCULAR | Status: DC
  Administered 2023-04-21: 150 ug via SUBCUTANEOUS
  Filled 2023-04-21 (×2): qty 0.3

## 2023-04-21 MED ORDER — HEPARIN SODIUM (PORCINE) 1000 UNIT/ML IJ SOLN
INTRAMUSCULAR | Status: AC
  Administered 2023-04-21: 2400 [IU]
  Filled 2023-04-21: qty 3

## 2023-04-21 MED ORDER — SODIUM CHLORIDE 0.9 % IV SOLN
2.0000 g | Freq: Once | INTRAVENOUS | Status: AC
  Administered 2023-04-23: 2 g via INTRAVENOUS
  Filled 2023-04-21: qty 20

## 2023-04-21 NOTE — Progress Notes (Signed)
[facility] Progress Note   Assessment/ Plan:   Acute kidney injury - b/l creat 1.0 from jan 2023. Creat here was 1.6 on admission in setting of pneumonia, hypotension and septic shock. Creat peaked at 1.7 and then returned to 1.0 on 7/12. Renal failure then worsened again up to 3.84 and CRRT started 7/19. AKI felt to be related to hypotension and IV contrast +/- diuretics. Has a two day CRRT holiday which showed min UOP.   - on MWF IHD  next today  - converting nontunneled to tunneled HD cath when possible-  will try for this week  - OK for heparin- no evidence of HIT, in vitro hemolysis Sepsis/ shock/ hypotension - initial dx on admission was pna and septic shock, now resolved. BP's have been much better, last pressors were on 7/24.  Acute hypoxic respiratory failure - due to COPD/ pna, on vent w/ trach per CCM. F/u CXR 7/23 was negative for edema. Attempting to wean vent Volume excess/ lower extremity edema Anemia - Hb 7- 9 range, tranfuse prn. Will add ESA COPD Anxiety/ depression Dispo-  possibly planning for LTAC  Subjective:    Seen in room.  Doing well, more awake and alert.     Objective:   BP (!) 124/97   Pulse (!) 103   Temp 98 F (36.7 C) (Oral)   Resp (!) 23   Ht 5\' 5"  (1.651 m)   Wt 74.5 kg   SpO2 97%   BMI 27.33 kg/m   Intake/Output Summary (Last 24 hours) at 04/21/2023 0716 Last data filed at 04/21/2023 0600 Gross per 24 hour  Intake 1260 ml  Output 35 ml  Net 1225 ml   Weight change: -0.4 kg  Physical Exam: Gen:ill-appearing but awake-  seems to under stand what I am saying to her CVS: RRR Resp: + trach, coarse mech bilaterally Abd: + soft Ext: 1+ edema, UE greater than LE, improving ACCESS: R internal jugular nontunneled HD Cath  Imaging: VAS [place] LOWER EXTREMITY VENOUS (DVT)  Result Date: 04/20/2023  Lower Venous DVT Study Patient Name:  Tamara Castro  Date of Exam:   04/19/2023 Medical Rec #: [id number]            Accession #:     [id number] Date of Birth: [birth year]           Patient Gender: F Patient Age:   62 years Exam Location:  [facility] Procedure:      VAS [place] LOWER EXTREMITY VENOUS (DVT) Referring Phys: Levy Pupa --------------------------------------------------------------------------------  Indications: Fever and elevated D-Dimer.  Limitations: Ventilation. Comparison Study: No prior study on file Performing Technologist: Sherren Kerns RVS  Examination Guidelines: A complete evaluation includes B-mode imaging, spectral Doppler, color Doppler, and power Doppler as needed of all accessible portions of each vessel. Bilateral testing is considered an integral part of a complete examination. Limited examinations for reoccurring indications may be performed as noted. The reflux portion of the exam is performed with the patient in reverse Trendelenburg.  +---------+---------------+---------+-----------+----------+-------------------+ RIGHT    CompressibilityPhasicitySpontaneityPropertiesThrombus Aging      +---------+---------------+---------+-----------+----------+-------------------+ CFV      Full           Yes      Yes                                      +---------+---------------+---------+-----------+----------+-------------------+ SFJ  Full                                                             +---------+---------------+---------+-----------+----------+-------------------+ FV Prox  Full                                                             +---------+---------------+---------+-----------+----------+-------------------+ FV Mid   Full                                                             +---------+---------------+---------+-----------+----------+-------------------+ FV DistalFull                                                             +---------+---------------+---------+-----------+----------+-------------------+ PFV      Full                                                              +---------+---------------+---------+-----------+----------+-------------------+ POP                     Yes      Yes                  patent by color and                                                       Doppler             +---------+---------------+---------+-----------+----------+-------------------+ PTV                                                   patent by color     +---------+---------------+---------+-----------+----------+-------------------+ PERO                                                  Not well visualized +---------+---------------+---------+-----------+----------+-------------------+   +---------+---------------+---------+-----------+----------+-------------------+ LEFT     CompressibilityPhasicitySpontaneityPropertiesThrombus Aging      +---------+---------------+---------+-----------+----------+-------------------+ CFV      Full           Yes      Yes                                      +---------+---------------+---------+-----------+----------+-------------------+  SFJ      Full                                                             +---------+---------------+---------+-----------+----------+-------------------+ FV Prox  Full                                                             +---------+---------------+---------+-----------+----------+-------------------+ FV Mid   Full                                                             +---------+---------------+---------+-----------+----------+-------------------+ FV DistalFull                                                             +---------+---------------+---------+-----------+----------+-------------------+ PFV      Full                                                             +---------+---------------+---------+-----------+----------+-------------------+ POP      Full           Yes      Yes                                       +---------+---------------+---------+-----------+----------+-------------------+ PTV      Full                                                             +---------+---------------+---------+-----------+----------+-------------------+ PERO                                                  Not well visualized +---------+---------------+---------+-----------+----------+-------------------+     Summary: RIGHT: - There is no evidence of deep vein thrombosis in the lower extremity. However, portions of this examination were limited- see technologist comments above.  LEFT: - There is no evidence of deep vein thrombosis in the lower extremity. However, portions of this examination were limited- see technologist comments above.  *See table(s) above for measurements and observations. Electronically signed by Heath Lark on 04/20/2023 at 5:05:15 PM.    Final    CT ABDOMEN WO CONTRAST  Result Date: 04/19/2023 CLINICAL DATA:  Anatomy eval for possible G tube palcement. Please assign to IR radiologist. EXAM: CT ABDOMEN WITHOUT CONTRAST TECHNIQUE: Multidetector CT imaging of the abdomen was performed following the standard protocol without IV contrast. RADIATION DOSE REDUCTION: This exam was performed according to the departmental dose-optimization program which includes automated exposure control, adjustment of the mA and/or kV according to patient size and/or use of iterative reconstruction technique. COMPARISON:  04/02/2023 FINDINGS: Lower chest: Near complete resolution of pleural effusions. Pulmonary emphysema with some dependent atelectasis or consolidation posteriorly in the lung bases. Hepatobiliary: No focal liver abnormality is seen. Status post cholecystectomy. No biliary dilatation. Pancreas: Unremarkable. No pancreatic ductal dilatation or surrounding inflammatory changes. Spleen: Normal in size without focal abnormality. Adrenals/Urinary Tract: No adrenal mass.  4.8 cm 7 HU mid right renal probable cyst as before, requires no follow-up. No urolithiasis or hydronephrosis. Stomach/Bowel: Weighted tip feeding tube extends to the gastric antrum. Stomach partially distended. There is safe anterior window for percutaneous gastrostomy placement. Visualized small bowel and colon unremarkable. Vascular/Lymphatic: Scattered aortoiliac calcified plaque without aneurysm. No abdominal or retroperitoneal adenopathy. Other: No ascites.  No free air. Musculoskeletal: Mild multilevel spondylitic change in the lumbar spine. No fracture or worrisome bone lesion. IMPRESSION: 1. There is safe  window for percutaneous gastrostomy placement. 2. Near complete resolution of pleural effusions. 3. Aortic Atherosclerosis (ICD10-I70.0) and Emphysema (ICD10-J43.9). Electronically Signed   By: Corlis Leak M.D.   On: 04/19/2023 15:34    Labs: BMET Recent Labs  Lab 04/15/23 0344 04/16/23 0421 04/17/23 0530 04/18/23 1232 04/19/23 0832 04/20/23 0022 04/21/23 0131  NA 136 135 SPECIMEN HEMOLYZED. HEMOLYSIS MAY AFFECT INTEGRITY OF RESULTS. 137 136 135 135  K 3.5 4.1 SPECIMEN HEMOLYZED. HEMOLYSIS MAY AFFECT INTEGRITY OF RESULTS. 4.3 4.4 4.5 5.7*  CL 96* 96* SPECIMEN HEMOLYZED. HEMOLYSIS MAY AFFECT INTEGRITY OF RESULTS. 98 91* 94* 94*  CO2 26 27 SPECIMEN HEMOLYZED. HEMOLYSIS MAY AFFECT INTEGRITY OF RESULTS. 23 28 27 26   GLUCOSE 168* 145* SPECIMEN HEMOLYZED. HEMOLYSIS MAY AFFECT INTEGRITY OF RESULTS. 133* 160* 148* 139*  BUN 32* 62* SPECIMEN HEMOLYZED. HEMOLYSIS MAY AFFECT INTEGRITY OF RESULTS. 110* 58* 87* 137*  CREATININE 3.28* 4.53* SPECIMEN HEMOLYZED. HEMOLYSIS MAY AFFECT INTEGRITY OF RESULTS. 6.24* 3.38* 4.38* 5.96*  CALCIUM 8.6* 8.3* SPECIMEN HEMOLYZED. HEMOLYSIS MAY AFFECT INTEGRITY OF RESULTS. 9.6 9.1 8.7* 8.5*   CBC Recent Labs  Lab 04/15/23 0344 04/16/23 0421 04/18/23 0541 04/19/23 0648 04/20/23 0022 04/21/23 0131  WBC 10.3   < > 15.7* 23.7* 16.7* 12.4*  NEUTROABS 8.0*  --    --   --   --   --   HGB 8.7*   < > 8.3* 9.5* 8.2* 7.6*  HCT 26.3*   < > 24.0* 28.5* 25.5* 24.5*  MCV 93.9   < > 86.0 89.3 91.1 93.2  PLT 233   < > 179 206  203 210 227   < > = values in this interval not displayed.    Medications:     arformoterol  15 mcg Nebulization BID   atorvastatin  20 mg Per Tube Daily   budesonide (PULMICORT) nebulizer solution  0.5 mg Nebulization BID   Chlorhexidine Gluconate Cloth  6 each Topical Q0600   clonazePAM  0.5 mg Per Tube BID   docusate  100 mg Per Tube BID   escitalopram  10 mg Per Tube Daily   gabapentin  200 mg Per Tube QHS   Gerhardt's butt cream   Topical BID  heparin injection (subcutaneous)  5,000 Units Subcutaneous Q8H   insulin aspart  0-9 Units Subcutaneous Q4H   multivitamin  1 tablet Per Tube QHS   mouth rinse  15 mL Mouth Rinse Q2H   pantoprazole (PROTONIX) IV  40 mg Intravenous Q24H   polyethylene glycol  17 g Per NG tube Daily   revefenacin  175 mcg Nebulization Daily   sodium chloride flush  10-40 mL Intracatheter Q12H   sodium chloride flush  3 mL Intravenous Q12H    A   04/21/2023, 7:16 AM

## 2023-04-21 NOTE — Consult Note (Signed)
Chief Complaint: Tunneled HD catheter and Gastrostomy tube placement in preparation of transfer to [facility]  Referring Physician(s): Bhutta,Khuram A  Supervising Physician: Richarda Overlie  Patient Status: [facility] - In-pt  History of Present Illness: Tamara Castro is a 62 y.o. female inpatient. History of Presented to [facility] on 7.5.24 with URI symptoms X 1 week. Found to be hypoxic in respiratory failure, acute heart failure and AKI. Patient was transferred to [facility] and then ultimately to [facility] for dialysis. Hospital stay complicated by ileus, difficulty weaning from vent and anemia requiring blood transfusions. Patient is trached ( 7.23.24) and vent dependent. With a triple lumen temp cath placed on 7.19.24. Team is requesting a G tube and tunneled dialysis catheter in preparation to discharge to a LTACH.   Unable to assess ROS due to patient condition. Return precautions and treatment recommendations and follow-up discussed with the patient;s daughter  who is agreeable with the plan.   Past Medical History:  Diagnosis Date   Anxiety    Asthma    Bladder polyps    per pt, urethral polyps   COPD (chronic obstructive pulmonary disease) (HCC)    Dysplasia of cervix    Hyperlipidemia    Osteoarthritis     Past Surgical History:  Procedure Laterality Date   ABDOMINAL HYSTERECTOMY     CHOLECYSTECTOMY     TONSILLECTOMY Bilateral    TOTAL COLECTOMY  1998    Allergies: Ciprofloxacin  Medications: Prior to Admission medications   Medication Sig Start Date End Date Taking? Authorizing Provider  albuterol (ACCUNEB) 0.63 MG/3ML nebulizer solution Take 1 ampule by nebulization every 4 (four) hours as needed for wheezing or shortness of breath. 09/17/21  Yes [provider]  ALPRAZolam Prudy Feeler) 1 MG tablet Take 1 mg by mouth 4 (four) times daily. 03/15/21  Yes [provider]  ANORO ELLIPTA 62.5-25 MCG/INH AEPB Inhale 1 puff into the lungs daily. 03/13/21  Yes [provider]  diphenhydrAMINE HCl, Sleep, (ZZZQUIL PO) Take 2 tablets by mouth at bedtime. Pt takes 2 gummies every night   Yes [provider]  escitalopram (LEXAPRO) 10 MG tablet Take 10 mg by mouth 2 (two) times daily. 12/16/20  Yes [provider]  gabapentin (NEURONTIN) 400 MG capsule Take 800 mg by mouth 3 (three) times daily as needed. 03/13/21  Yes [provider]  ondansetron (ZOFRAN) 4 MG tablet Take 4 mg by mouth every 8 (eight) hours as needed for nausea or vomiting. 02/20/23  Yes [provider]  PROAIR HFA 108 (90 Base) MCG/ACT inhaler Inhale 2 puffs into the lungs 4 (four) times daily. 03/13/21  Yes [provider]     No family history on file.  Social History   Socioeconomic History   Marital status: Divorced    Spouse name: Not on file   Number of children: 2   Years of education: Not on file   Highest education level: Not on file  Occupational History   Occupation: Disability since 2013  Tobacco Use   Smoking status: Some Days    Current packs/day: 1.00    Average packs/day: 1 pack/day for 40.0 years (40.0 ttl pk-yrs)    Types: Cigarettes   Smokeless tobacco: Never   Tobacco comments:    Pt is down to smoking 6 cigarettes a day  Vaping Use   Vaping status: Former  Substance and Sexual Activity   Alcohol use: Not Currently   Drug use: Not Currently    Comment: hx  of smoking marijuana   Sexual activity: Not Currently  Other Topics Concern   Not on file  Social History Narrative   Not on file   Social Determinants of Health   Financial Resource Strain: Low Risk  (04/09/2021)   Overall Financial Resource Strain (CARDIA)    Difficulty of Paying Living Expenses: Not hard at all  Food Insecurity: No Food Insecurity (04/04/2023)   Hunger Vital Sign    Worried About Running Out of Food in the Last Year: Never true    Ran Out of Food in the Last Year: Never true  Transportation Needs: No Transportation Needs (04/04/2023)    PRAPARE - Administrator, Civil Service (Medical): No    Lack of Transportation (Non-Medical): No  Physical Activity: Insufficiently Active (04/09/2021)   Exercise Vital Sign    Days of Exercise per Week: 3 days    Minutes of Exercise per Session: 20 min  Stress: No Stress Concern Present (04/09/2021)   [employer] of Occupational Health - Occupational Stress Questionnaire    Feeling of Stress : Only a little  Social Connections: Socially Isolated (04/09/2021)   Social Connection and Isolation Panel [NHANES]    Frequency of Communication with Friends and Family: More than three times a week    Frequency of Social Gatherings with Friends and Family: Three times a week    Attends Religious Services: Never    Active Member of Clubs or Organizations: No    Attends Banker Meetings: Never    Marital Status: Divorced    Review of Systems: A 12 point ROS discussed and pertinent positives are indicated in the HPI above.  All other systems are negative.  Review of Systems  Unable to perform ROS: Other    Vital Signs: BP 129/79   Pulse (!) 114   Temp 98.8 F (37.1 C) (Oral)   Resp 20   Ht 5\' 5"  (1.651 m)   Wt 164 lb 3.9 oz (74.5 kg)   SpO2 97%   BMI 27.33 kg/m     Physical Exam Vitals and nursing note reviewed.  Constitutional:      Appearance: She is well-developed.  HENT:     Head: Normocephalic and atraumatic.  Cardiovascular:     Rate and Rhythm: Regular rhythm. Tachycardia present.  Pulmonary:     Comments: Trach and on the vent Skin:    General: Skin is dry.     Imaging: VAS [place] LOWER EXTREMITY VENOUS (DVT)  Result Date: 04/20/2023  Lower Venous DVT Study Patient Name:  Tamara Castro  Date of Exam:   04/19/2023 Medical Rec #: [id number]            Accession #:    [id number] Date of Birth: 05/30/1961           Patient Gender: F Patient Age:   61 years Exam Location:  [facility] Procedure:      VAS [place] LOWER EXTREMITY VENOUS  (DVT) Referring Phys: Levy Pupa --------------------------------------------------------------------------------  Indications: Fever and elevated D-Dimer.  Limitations: Ventilation. Comparison Study: No prior study on file Performing Technologist: Sherren Kerns RVS  Examination Guidelines: A complete evaluation includes B-mode imaging, spectral Doppler, color Doppler, and power Doppler as needed of all accessible portions of each vessel. Bilateral testing is considered an integral part of a complete examination. Limited examinations for reoccurring indications may be performed as noted. The reflux portion of the exam is performed with the patient in reverse Trendelenburg.  +---------+---------------+---------+-----------+----------+-------------------+  RIGHT    CompressibilityPhasicitySpontaneityPropertiesThrombus Aging      +---------+---------------+---------+-----------+----------+-------------------+ CFV      Full           Yes      Yes                                      +---------+---------------+---------+-----------+----------+-------------------+ SFJ      Full                                                             +---------+---------------+---------+-----------+----------+-------------------+ FV Prox  Full                                                             +---------+---------------+---------+-----------+----------+-------------------+ FV Mid   Full                                                             +---------+---------------+---------+-----------+----------+-------------------+ FV DistalFull                                                             +---------+---------------+---------+-----------+----------+-------------------+ PFV      Full                                                             +---------+---------------+---------+-----------+----------+-------------------+ POP                     Yes      Yes                   patent by color and                                                       Doppler             +---------+---------------+---------+-----------+----------+-------------------+ PTV                                                   patent by color     +---------+---------------+---------+-----------+----------+-------------------+ PERO  Not well visualized +---------+---------------+---------+-----------+----------+-------------------+   +---------+---------------+---------+-----------+----------+-------------------+ LEFT     CompressibilityPhasicitySpontaneityPropertiesThrombus Aging      +---------+---------------+---------+-----------+----------+-------------------+ CFV      Full           Yes      Yes                                      +---------+---------------+---------+-----------+----------+-------------------+ SFJ      Full                                                             +---------+---------------+---------+-----------+----------+-------------------+ FV Prox  Full                                                             +---------+---------------+---------+-----------+----------+-------------------+ FV Mid   Full                                                             +---------+---------------+---------+-----------+----------+-------------------+ FV DistalFull                                                             +---------+---------------+---------+-----------+----------+-------------------+ PFV      Full                                                             +---------+---------------+---------+-----------+----------+-------------------+ POP      Full           Yes      Yes                                      +---------+---------------+---------+-----------+----------+-------------------+ PTV      Full                                                              +---------+---------------+---------+-----------+----------+-------------------+ PERO                                                  Not well visualized +---------+---------------+---------+-----------+----------+-------------------+  Summary: RIGHT: - There is no evidence of deep vein thrombosis in the lower extremity. However, portions of this examination were limited- see technologist comments above.  LEFT: - There is no evidence of deep vein thrombosis in the lower extremity. However, portions of this examination were limited- see technologist comments above.  *See table(s) above for measurements and observations. Electronically signed by Heath Lark on 04/20/2023 at 5:05:15 PM.    Final    CT ABDOMEN WO CONTRAST  Result Date: 04/19/2023 CLINICAL DATA:  Anatomy eval for possible G tube palcement. Please assign to IR radiologist. EXAM: CT ABDOMEN WITHOUT CONTRAST TECHNIQUE: Multidetector CT imaging of the abdomen was performed following the standard protocol without IV contrast. RADIATION DOSE REDUCTION: This exam was performed according to the departmental dose-optimization program which includes automated exposure control, adjustment of the mA and/or kV according to patient size and/or use of iterative reconstruction technique. COMPARISON:  04/02/2023 FINDINGS: Lower chest: Near complete resolution of pleural effusions. Pulmonary emphysema with some dependent atelectasis or consolidation posteriorly in the lung bases. Hepatobiliary: No focal liver abnormality is seen. Status post cholecystectomy. No biliary dilatation. Pancreas: Unremarkable. No pancreatic ductal dilatation or surrounding inflammatory changes. Spleen: Normal in size without focal abnormality. Adrenals/Urinary Tract: No adrenal mass. 4.8 cm 7 HU mid right renal probable cyst as before, requires no follow-up. No urolithiasis or hydronephrosis. Stomach/Bowel: Weighted tip feeding tube extends to the  gastric antrum. Stomach partially distended. There is safe anterior window for percutaneous gastrostomy placement. Visualized small bowel and colon unremarkable. Vascular/Lymphatic: Scattered aortoiliac calcified plaque without aneurysm. No abdominal or retroperitoneal adenopathy. Other: No ascites.  No free air. Musculoskeletal: Mild multilevel spondylitic change in the lumbar spine. No fracture or worrisome bone lesion. IMPRESSION: 1. There is safe  window for percutaneous gastrostomy placement. 2. Near complete resolution of pleural effusions. 3. Aortic Atherosclerosis (ICD10-I70.0) and Emphysema (ICD10-J43.9). Electronically Signed   By: Corlis Leak M.D.   On: 04/19/2023 15:34   DG Abd Portable 1V  Result Date: 04/18/2023 CLINICAL DATA:  Feeding tube placement. EXAM: PORTABLE ABDOMEN - 1 VIEW COMPARISON:  April 16, 2023. FINDINGS: Distal tip of feeding tube is seen in expected position of distal stomach. IMPRESSION: Distal tip of feeding tube seen in expected position of distal stomach. Electronically Signed   By: Lupita Raider M.D.   On: 04/18/2023 10:16   DG Abd 1 View  Result Date: 04/16/2023 CLINICAL DATA:  NG tube EXAM: ABDOMEN - 1 VIEW COMPARISON:  Abdominal x-ray 04/11/2023 FINDINGS: Nasogastric tube tip is in the proximal body of the stomach. IMPRESSION: Nasogastric tube tip is in the proximal body of the stomach. Consider advancing tube 6-7 cm. Electronically Signed   By: Darliss Cheney M.D.   On: 04/16/2023 22:10   DG CHEST PORT 1 VIEW  Result Date: 04/15/2023 CLINICAL DATA:  Respiratory failure EXAM: PORTABLE CHEST 1 VIEW COMPARISON:  04/08/2023 FINDINGS: Unchanged support apparatus including tracheostomy, left and right neck vascular catheters, and partially imaged enteric feeding tube. Mild cardiomegaly. Emphysema with mild, diffuse superimposed interstitial opacity and possible small layering pleural effusions, generally similar to prior examination. Osseous structures unremarkable.  IMPRESSION: 1. Emphysema with mild, diffuse superimposed interstitial opacity and possible small layering pleural effusions, generally similar to prior examination. No focal airspace opacity. 2. Unchanged support apparatus including tracheostomy, left and right neck vascular catheters, and partially imaged enteric feeding tube. Electronically Signed   By: Jearld Lesch M.D.   On: 04/15/2023 13:24   MR BRAIN WO CONTRAST  Result Date: 04/13/2023 CLINICAL DATA:  Neuro deficit, acute, stroke suspected. Respiratory failure. Asymmetric pupils. EXAM: MRI HEAD WITHOUT CONTRAST TECHNIQUE: Multiplanar, multiecho pulse sequences of the brain and surrounding structures were obtained without intravenous contrast. COMPARISON:  None Available. FINDINGS: Brain: Study is mildly degraded by patient motion. Diffusion-weighted images demonstrate no acute or subacute infarction. Moderate generalized atrophy is present. Periventricular and scattered subcortical T2 hyperintensities bilaterally are moderately advanced for age. The ventricles are proportionate to the degree of atrophy. Deep brain nuclei are within normal limits. No significant extraaxial fluid collection is present. The brainstem and cerebellum are within normal limits. The internal auditory canals are within normal limits. Midline structures are within normal limits. Vascular: Flow is present in the major intracranial arteries. Skull and upper cervical spine: The craniocervical junction is normal. Upper cervical spine is within normal limits. Marrow signal is unremarkable. Sinuses/Orbits: A right mastoid effusion is present. Paranasal sinuses and mastoid air cells are otherwise clear. The globes and orbits are within normal limits. IMPRESSION: 1. No acute intracranial abnormality. 2. Moderate generalized atrophy and white matter disease is moderately advanced for age. This likely reflects the sequela of chronic microvascular ischemia. 3. Right mastoid effusion.  Electronically Signed   By: Marin Roberts M.D.   On: 04/13/2023 19:04   CT HEAD CODE STROKE WO CONTRAST  Result Date: 04/13/2023 CLINICAL DATA:  Code stroke.  Unequal pupils EXAM: CT HEAD WITHOUT CONTRAST TECHNIQUE: Contiguous axial images were obtained from the base of the skull through the vertex without intravenous contrast. RADIATION DOSE REDUCTION: This exam was performed according to the departmental dose-optimization program which includes automated exposure control, adjustment of the mA and/or kV according to patient size and/or use of iterative reconstruction technique. COMPARISON:  03/25/2023 FINDINGS: Brain: There is no mass, hemorrhage or extra-axial collection. The size and configuration of the ventricles and extra-axial CSF spaces are normal. There is hypoattenuation of the periventricular white matter, most commonly indicating chronic ischemic microangiopathy. Vascular: No abnormal hyperdensity of the major intracranial arteries or dural venous sinuses. No intracranial atherosclerosis. Skull: The visualized skull base, calvarium and extracranial soft tissues are normal. Sinuses/Orbits: No fluid levels or advanced mucosal thickening of the visualized paranasal sinuses. No mastoid or middle ear effusion. The orbits are normal. ASPECTS [facility] Stroke Program Early CT Score) - Ganglionic level infarction (caudate, lentiform nuclei, internal capsule, insula, M1-M3 cortex): 7 - Supraganglionic infarction (M4-M6 cortex): 3 Total score (0-10 with 10 being normal): 10 IMPRESSION: 1. No acute intracranial abnormality. 2. ASPECTS is 10. These results were communicated to Dr. Brooke Dare at 3:57 am on 04/13/2023 by text page via the [facility] messaging system. Electronically Signed   By: Deatra Robinson M.D.   On: 04/13/2023 03:58   EEG adult  Result Date: 04/11/2023 Charlsie Quest, MD     04/11/2023 10:29 PM Patient Name: Tamara Castro MRN: [id number] Epilepsy Attending: Charlsie Quest  Referring Physician/Provider: Modena Slater, DO Date: 04/11/2023 Duration: 26.41 mins Patient history: 62yo F with ams getting eeg to evaluate for seizure. Level of alertness: lethargic AEDs during EEG study: xanax Technical aspects: This EEG study was done with scalp electrodes positioned according to the 10-20 International system of electrode placement. Electrical activity was reviewed with band pass filter of 1-70Hz , sensitivity of 7 uV/mm, display speed of 54mm/sec with a 60Hz  notched filter applied as appropriate. EEG data were recorded continuously and digitally stored.  Video monitoring was available and reviewed as appropriate. Description: EEG showed continuous generalized polymorphic 3  to 6 Hz theta-delta slowing. Hyperventilation and photic stimulation were not performed.   ABNORMALITY - Continuous slow, generalized IMPRESSION: This study is suggestive of moderate to severe diffuse encephalopathy, nonspecific etiology. No seizures or epileptiform discharges were seen throughout the recording. Charlsie Quest   DG Abd 1 View  Result Date: 04/11/2023 CLINICAL DATA:  Advancement of nasogastric tube. EXAM: ABDOMEN - 1 VIEW COMPARISON:  Film earlier today at 0932 hours FINDINGS: Nasogastric tube is been advanced into the stomach. Both the feeding tube and nasogastric tube are now in similar positions within the fundus/proximal body of the stomach IMPRESSION: Advancement of nasogastric tube into the stomach. Both feeding tube and nasogastric tube are within the fundus/proximal body of the stomach. Electronically Signed   By: Irish Lack M.D.   On: 04/11/2023 13:00   DG Abd 1 View  Result Date: 04/11/2023 CLINICAL DATA:  NG tube placement. EXAM: ABDOMEN - 1 VIEW COMPARISON:  KUB 04/04/2023 FINDINGS: There are two enteric catheter is in place with tips in the stomach. The sidehole of the newer tube which appears non weighted and not present on the prior study is at the gastroesophageal junction. There  is persistent gaseous distention of the small bowel, improved in the interim. There is no definite free intraperitoneal air. IMPRESSION: 1. Two enteric catheter is in place with tips in the stomach. The side-hole of the newer nonweighted tube is at the GE junction. Recommend advancement of this tube by approximately 6 cm. 2. Improved gaseous distention of the bowel. These results will be called to the ordering clinician or representative by the Radiologist Assistant, and communication documented in the PACS or [employer]. Electronically Signed   By: Lesia Hausen M.D.   On: 04/11/2023 10:25   DG Chest Port 1 View  Result Date: 04/08/2023 CLINICAL DATA:  Tracheostomy EXAM: PORTABLE CHEST 1 VIEW COMPARISON:  04/08/2023, CT 09/19/2021, chest x-ray 04/05/2023 FINDINGS: Interval tracheostomy tube with tip about 5.1 cm superior to carina. Esophageal tube tip in the stomach. Bilateral IJ central venous catheter tips similar in position, overlying brachiocephalic confluence. Emphysema. Similar appearance of mild lower lung reticular interstitial opacities. Stable cardiomediastinal silhouette. IMPRESSION: 1. Interval tracheostomy tube as above. Remaining support lines and tubes are stable in position. 2. Emphysema and mild reticular interstitial changes in the mid to lower lungs without significant change. Electronically Signed   By: Jasmine Pang M.D.   On: 04/08/2023 15:37   DG Chest Port 1 View  Result Date: 04/08/2023 CLINICAL DATA:  [id number].  Respiratory failure.  Asthma and COPD. EXAM: PORTABLE CHEST 1 VIEW COMPARISON:  Portable chest 04/05/2023 FINDINGS: 4:08 a.m. ETT interval pullback to 8.8 cm from the carina in the upper thoracic trachea, previously 7.3 cm. Feeding tube radiopaque tip extends to the left in the stomach and abuts the far wall in the gastric body. Again the right IJ double-lumen catheter and left IJ single-lumen catheter both terminate in the upper SVC. Small pleural effusions are again  noted with improved interstitial haziness in the lung bases. Remaining lungs emphysematous and otherwise clear. The cardiomediastinal silhouette and vasculature are normal. Osteopenia with no new osseous findings. IMPRESSION: 1. ETT interval pullback to 8.8 cm from the carina. 2. Improved interstitial haziness in the lung bases. 3. Stable small pleural effusions. 4. No further new findings. Electronically Signed   By: Almira Bar M.D.   On: 04/08/2023 06:05   DG Chest Port 1 View  Result Date: 04/05/2023 CLINICAL DATA:  [id number] Respiratory failure (  HCC) [id number] EXAM: PORTABLE CHEST - 1 VIEW COMPARISON:  04/04/2023 FINDINGS: Endotracheal tube has been advanced, tip 6 cm above carina. Stable left IJ central line and right IJ hemodialysis catheter. No pneumothorax. Feeding tube is been advanced for at least as far as the stomach, tip not seen. Prominent interstitial opacities in the lung bases right greater than left slightly more conspicuous than on prior exam. Costophrenic angles are excluded. Visualized bones unremarkable. IMPRESSION: 1. Interval advancement of endotracheal tube and feeding tube. 2. Slightly increased bibasilar interstitial opacities. Electronically Signed   By: Corlis Leak M.D.   On: 04/05/2023 08:59   DG Chest Port 1 View  Result Date: 04/04/2023 CLINICAL DATA:  Evaluate central venous catheter placement. EXAM: PORTABLE CHEST 1 VIEW COMPARISON:  04/04/2023 FINDINGS: Unchanged position ET tube, left IJ catheter and feeding tube. Interval placement of right IJ catheter with tip at the mid SVC. No pneumothorax visualized. The stable cardiomediastinal contours. Interval improved aeration to the left base. Blunting of the right costophrenic angle is noted compatible with small pleural effusion. Emphysematous changes are noted bilaterally. No airspace consolidation. IMPRESSION: 1. Interval placement of right IJ catheter with tip at the mid SVC. No pneumothorax visualized. 2. Interval improved  aeration to the left base. 3. Small right pleural effusion. 4.  Aortic Atherosclerosis (ICD10-I70.0). Electronically Signed   By: Signa Kell M.D.   On: 04/04/2023 18:15   DG Naso G Tube Plc W/Fl W/Rad  Result Date: 04/04/2023 CLINICAL DATA:  Provided history: Aspiration into airway. Request received for nasogastric tube advancement to a post pyloric position under fluoroscopy. EXAM: NASO G TUBE PLACEMENT WITH FL AND WITH RAD CONTRAST:  None. FLUOROSCOPY: Fluoroscopy Time:  1 minute 36 seconds Radiation Exposure Index (if provided by the fluoroscopic device): 11.70 Number of Acquired Spot Images: 0 COMPARISON:  Abdominal radiographs 04/04/2023. FINDINGS: Multiple attempts were made to advance a nasogastric tube from the gastric lumen to a post-pyloric position, over a guidewire and under fluoroscopic guidance. Despite multiple attempts, the nasogastric tube could not be advanced beyond the gastric body. At procedure termination, the tip of the nasogastric tube remained within the gastric body. A fluoroscopic image was saved and sent to PACS. IMPRESSION: Unsuccessful nasogastric tube advancement under fluoroscopy. Despite multiple attempts, the tube could not be advanced beyond the gastric body. Electronically Signed   By: Jackey Loge D.O.   On: 04/04/2023 15:44   DG Abd Portable 1V  Result Date: 04/04/2023 CLINICAL DATA:  Ileus. EXAM: PORTABLE ABDOMEN - 1 VIEW COMPARISON:  April 03, 2023.  April 02, 2023. FINDINGS: Distal tip of feeding tube is again noted in proximal stomach. Residual contrast is noted in distal sigmoid colon and rectum. Grossly stable small bowel dilatation is noted. IMPRESSION: Grossly stable small bowel dilatation is noted. Electronically Signed   By: Lupita Raider M.D.   On: 04/04/2023 12:32   DG Chest Port 1 View  Result Date: 04/04/2023 CLINICAL DATA:  Respiratory failure. EXAM: PORTABLE CHEST 1 VIEW COMPARISON:  04/02/2023 FINDINGS: Endotracheal tube tip projects  approximately 7 centimeters above the carina. Enteric tube courses beyond the edge of the image. A LEFT IJ catheter tip overlies the superior vena cava. Heart size is normal. There are patchy opacities in the lung bases, partially obscuring the hemidiaphragms. Suspect bilateral pleural effusions. The appearance is stable. IMPRESSION: 1. No significant change. 2. Bibasilar opacities and pleural effusions. Electronically Signed   By: Norva Pavlov M.D.   On: 04/04/2023 11:09   DG  Abd 1 View  Result Date: 04/03/2023 CLINICAL DATA:  NG tube placement EXAM: ABDOMEN - 1 VIEW COMPARISON:  04/01/2023 FINDINGS: Weighted enteric feeding tube is positioned with tip below the diaphragm, within the gastric fundus. Similar, distended configuration of bowel in the central abdomen. No free air. IMPRESSION: 1. Weighted enteric feeding tube is positioned with tip below the diaphragm, within the gastric fundus. 2. Similar, distended configuration of bowel in the central abdomen. Electronically Signed   By: Jearld Lesch M.D.   On: 04/03/2023 14:40   CT ABDOMEN PELVIS WO CONTRAST  Result Date: 04/02/2023 CLINICAL DATA:  Sepsis ileus vs bowel obstruction EXAM: CT ABDOMEN AND PELVIS WITHOUT CONTRAST TECHNIQUE: Multidetector CT imaging of the abdomen and pelvis was performed following the standard protocol without IV contrast. RADIATION DOSE REDUCTION: This exam was performed according to the departmental dose-optimization program which includes automated exposure control, adjustment of the mA and/or kV according to patient size and/or use of iterative reconstruction technique. COMPARISON:  CT scan abdomen and pelvis from 03/27/2023. FINDINGS: Lower chest: Redemonstration of small-to-moderate bilateral pleural effusions with associated compressive atelectatic changes in the bilateral lower lobes. No significant interval change. There are at least moderate diffuse emphysematous changes in the visualized lungs. Mild smooth  interlobular septal thickening noted, favoring congestive heart failure/pulmonary edema. The heart is normal in size. No pericardial effusion. There is apparent hypoattenuation of the blood pool relative to the myocardium, suggestive of anemia. Hepatobiliary: The liver is normal in size. There is subtle nodularity of the inferior right hepatic lobe, nonspecific. No frank cirrhotic changes noted. No suspicious mass. No intrahepatic bile duct dilation. There is mild prominence of the extrahepatic bile duct, most likely due to post cholecystectomy status. Gallbladder is surgically absent. Pancreas: Unremarkable. No pancreatic ductal dilatation or surrounding inflammatory changes. Spleen: Within normal limits. No focal lesion. Adrenals/Urinary Tract: Adrenal glands are unremarkable. No suspicious renal mass. Stable partially exophytic cyst arising from the right kidney interpolar region, laterally and smaller cyst arising from the left kidney interpolar region, anteriorly. No hydronephrosis. No renal or ureteric calculi. Urinary bladder is decompressed secondary to Foley catheter, precluding optimal assessment. However, no large mass or stones identified. No perivesical fat stranding. Stomach/Bowel: Stomach is distended with positive oral contrast. An enteric tube is noted with its tip in the midbody region. Duodenum is not dilated. There are multiple proximal to middle small bowel loops, which are not dilated as well as not opacified with positive oral contrast. However, there are multiple loops of dilated featureless bowel loops which measure up to 6.3 cm in diameter. These bowel loops are opacified with hyperattenuating material, likely ingested on prior exam. There is probable end-to-side ileocolonic anastomosis in the posterior pelvis. No abnormal bowel wall thickening or perienteric fat stranding. No discrete transition point seen. 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 Vascular/Lymphatic: No ascites or pneumoperitoneum. No abdominal or pelvic lymphadenopathy, by size criteria. No aneurysmal dilation of the major abdominal arteries. There are mild peripheral atherosclerotic vascular calcifications of the aorta and its major branches. Reproductive: The uterus is surgically absent. No large adnexal mass. Other: Mild-to-moderate anasarca. Musculoskeletal: No suspicious osseous lesions. There are mild multilevel degenerative changes in the visualized spine. IMPRESSION: *Redemonstration of multiple dilated distal small bowel loops without definite transition point. Proximal small bowel loops are nondilated. Findings are grossly similar to the recent prior study and may represent adynamic ileus. *Congestive heart failure/pulmonary edema and bilateral pleural  effusions. *Multiple other nonacute observations, as described above. Electronically Signed   By: Jules Schick M.D.   On: 04/02/2023 17:53   ECHOCARDIOGRAM COMPLETE  Result Date: 04/02/2023    ECHOCARDIOGRAM REPORT   Patient Name:   Tamara Castro Date of Exam: 04/02/2023 Medical Rec #:  [id number]           Height:       65.0 in Accession #:    [id number]          Weight:       203.5 lb Date of Birth:  [birth year]          BSA:          1.993 m Patient Age:    61 years            BP:           137/72 mmHg Patient Gender: F                   HR:           64 bpm. Exam Location:  Inpatient Procedure: 2D Echo, Cardiac Doppler and Color Doppler Indications:    Elevated Troponin  History:        Patient has no prior history of Echocardiogram examinations.                 COPD; Risk Factors:Dyslipidemia.  Sonographer:    Harriette Bouillon RDCS Referring Phys: [phone] PAULA B SIMPSON IMPRESSIONS  1. Left ventricular ejection fraction, by estimation, is 60 to 65%. The left ventricle has normal function. The left ventricle has no regional wall motion abnormalities. Left ventricular diastolic parameters were normal.   2. Right ventricular systolic function is normal. The right ventricular size is normal.  3. The mitral valve is normal in structure. No evidence of mitral valve regurgitation. No evidence of mitral stenosis.  4. The aortic valve is tricuspid. There is mild calcification of the aortic valve. Aortic valve regurgitation is not visualized. No aortic stenosis is present.  5. The inferior vena cava is dilated in size with <50% respiratory variability, suggesting right atrial pressure of 15 mmHg. FINDINGS  Left Ventricle: Left ventricular ejection fraction, by estimation, is 60 to 65%. The left ventricle has normal function. The left ventricle has no regional wall motion abnormalities. The left ventricular internal cavity size was normal in size. There is  no left ventricular hypertrophy. Left ventricular diastolic parameters were normal. Right Ventricle: The right ventricular size is normal. No increase in right ventricular wall thickness. Right ventricular systolic function is normal. Left Atrium: Left atrial size was normal in size. Right Atrium: Right atrial size was normal in size. Pericardium: There is no evidence of pericardial effusion. Mitral Valve: The mitral valve is normal in structure. No evidence of mitral valve regurgitation. No evidence of mitral valve stenosis. Tricuspid Valve: The tricuspid valve is normal in structure. Tricuspid valve regurgitation is trivial. No evidence of tricuspid stenosis. Aortic Valve: The aortic valve is tricuspid. There is mild calcification of the aortic valve. Aortic valve regurgitation is not visualized. No aortic stenosis is present. Pulmonic Valve: The pulmonic valve was normal in structure. Pulmonic valve regurgitation is trivial. No evidence of pulmonic stenosis. Aorta: The aortic root is normal in size and structure. Venous: The inferior vena cava is dilated in size with less than 50% respiratory variability, suggesting right atrial pressure of 15 mmHg. IAS/Shunts: No  atrial level shunt detected by color flow Doppler.  LEFT  VENTRICLE PLAX 2D LVIDd:         5.10 cm   Diastology LVIDs:         3.90 cm   LV e' medial:    7.83 cm/s LV PW:         0.70 cm   LV E/e' medial:  10.0 LV IVS:        0.70 cm   LV e' lateral:   7.51 cm/s LVOT diam:     2.10 cm   LV E/e' lateral: 10.5 LV SV:         71 LV SV Index:   35 LVOT Area:     3.46 cm  RIGHT VENTRICLE             IVC RV S prime:     14.70 cm/s  IVC diam: 2.40 cm TAPSE (M-mode): 1.8 cm LEFT ATRIUM             Index        RIGHT ATRIUM           Index LA diam:        2.70 cm 1.35 cm/m   RA Area:     10.10 cm LA Vol (A2C):   35.7 ml 17.92 ml/m  RA Volume:   18.00 ml  9.03 ml/m LA Vol (A4C):   30.5 ml 15.31 ml/m LA Biplane Vol: 35.8 ml 17.97 ml/m  AORTIC VALVE LVOT Vmax:   86.50 cm/s LVOT Vmean:  53.400 cm/s LVOT VTI:    0.204 m  AORTA Ao Root diam: 3.00 cm Ao Asc diam:  2.80 cm MITRAL VALVE MV Area (PHT): 4.06 cm    SHUNTS MV Decel Time: 187 msec    Systemic VTI:  0.20 m MV E velocity: 78.60 cm/s  Systemic Diam: 2.10 cm MV A velocity: 78.60 cm/s MV E/A ratio:  1.00 Arvilla Meres MD Electronically signed by Arvilla Meres MD Signature Date/Time: 04/02/2023/12:09:52 PM    Final    Portable Chest xray  Result Date: 04/02/2023 CLINICAL DATA:  Respiratory failure. EXAM: PORTABLE CHEST 1 VIEW COMPARISON:  04/01/2023 FINDINGS: The endotracheal tube tip is stable approximately 4.9 cm above the carina. The left IJ catheter tip is in the projection of the mid SVC. Enteric tube has been advanced with tip below the GE junction. Stable cardiomediastinal contours. Advanced changes of emphysema. Persistent opacity within the left lung base which obscures the left hemidiaphragm. IMPRESSION: 1. Stable endotracheal tube. 2. Enteric tube has been advanced with tip below the GE junction. 3. Persistent left basilar opacity which may reflect atelectasis or pneumonia. 4. Advanced emphysema. Electronically Signed   By: Signa Kell M.D.   On:  04/02/2023 08:30   DG Abd 1 View  Result Date: 04/01/2023 CLINICAL DATA:  Confirm OG tube placement. EXAM: ABDOMEN - 1 VIEW COMPARISON:  04/01/2023. FINDINGS: A mildly distended gas-filled loop of colon is noted in the upper abdomen. There is mild gaseous distention of the small bowel measuring 4.0 cm. An enteric tube terminates in the stomach. Mild airspace disease is noted at the lung bases. There is blunting of the costophrenic angles bilaterally suggesting small pleural effusions. Surgical clips are noted in the right upper quadrant. IMPRESSION: 1. Enteric tube terminates in the stomach. 2. Gaseous distention of the colon and small bowel, possible ileus or obstruction. 3. Small bilateral pleural effusions with atelectasis or infiltrate. Electronically Signed   By: Thornell Sartorius M.D.   On: 04/01/2023 23:40   DG Abd 1  View  Result Date: 04/01/2023 CLINICAL DATA:  Confirmation of OG tube placement. EXAM: ABDOMEN - 1 VIEW COMPARISON:  04/01/2023. FINDINGS: Mildly distended gas-filled loop colon is noted in the upper abdomen. An enteric tube terminates in the stomach and appears to be in appropriate position. No radio-opaque calculi. Stable opacities are noted at the lung bases. IMPRESSION: 1. Enteric tube terminates in the stomach. 2. Mildly distended gas-filled loop of colon in the upper abdomen, unchanged. Electronically Signed   By: Thornell Sartorius M.D.   On: 04/01/2023 22:00   DG Abd 1 View  Result Date: 04/01/2023 CLINICAL DATA:  OG tube placement EXAM: ABDOMEN - 1 VIEW COMPARISON:  04/01/2023 FINDINGS: Partially visualized vascular catheter tip at brachiocephalic confluence. Interstitial thickening at the lung bases. Esophageal tube tip overlies the proximal stomach, possible fold or kink in the distal tube. IMPRESSION: Esophageal tube tip overlies the proximal stomach, possible fold or kink in the distal tube. Electronically Signed   By: Jasmine Pang M.D.   On: 04/01/2023 18:20   DG Abd 1  View  Result Date: 04/01/2023 CLINICAL DATA:  Ileus. EXAM: ABDOMEN - 1 VIEW COMPARISON:  Concurrent chest radiographs. Abdominopelvic CT and abdominal radiographs 03/27/2023. FINDINGS: Enteric tube is not visualized on this examination, tip at the level of the midesophagus on concurrent chest radiographs. Tube advancement recommended. In correlation with prior imaging, the patient appears to be status post subtotal colectomy with an ileorectal anastomosis. Enteric contrast has passed into the rectum. Unchanged moderate diffuse small bowel distension. No extravasated enteric contrast identified. Previously questioned possible intraluminal gallstone at the ileorectal anastomosis is not well seen on these images. IMPRESSION: 1. Enteric tube is not visualized on this examination, tip at the level of the midesophagus on concurrent chest radiographs. Tube advancement recommended. 2. Unchanged moderate diffuse small bowel distension status post subtotal colectomy and ileorectal anastomosis. Findings could be secondary to an ileus. However, there may be partial obstruction from a suggested intraluminal gallstone at the anastomosis on prior imaging. Electronically Signed   By: Carey Bullocks M.D.   On: 04/01/2023 17:01   DG Chest Port 1 View  Result Date: 04/01/2023 CLINICAL DATA:  Endotracheal tube present. EXAM: PORTABLE CHEST 1 VIEW COMPARISON:  Radiographs 03/31/2023 and 03/29/2023.  CT 03/21/2023. FINDINGS: 1606 hours. Tip of the endotracheal tube overlies the mid trachea. Left IJ central venous catheter projects to the upper SVC. Enteric tube tip projects over the left atrium with the side hole at the level of the aortic arch. The should be advanced at least 20 cm. The heart size and mediastinal contours are stable. Unchanged residual patchy airspace opacities at both lung bases. Underlying emphysema noted. No evidence of pneumothorax or significant pleural effusion. No acute osseous findings are seen.  IMPRESSION: 1. Enteric tube tip projects over the left atrium and should be advanced at least 20 cm. 2. No other significant changes from prior study. Persistent bibasilar airspace opacities superimposed on emphysema. Electronically Signed   By: Carey Bullocks M.D.   On: 04/01/2023 16:53    Labs:  CBC: Recent Labs    04/18/23 0541 04/19/23 0648 04/20/23 0022 04/21/23 0131  WBC 15.7* 23.7* 16.7* 12.4*  HGB 8.3* 9.5* 8.2* 7.6*  HCT 24.0* 28.5* 25.5* 24.5*  PLT 179 206  203 210 227    COAGS: Recent Labs    04/07/23 0417 04/08/23 0506 04/17/23 1142 04/19/23 0648  INR  --   --  1.1 1.0  APTT 29 52* 41* 27  BMP: Recent Labs    04/17/23 0530 04/18/23 1232 04/19/23 0832 04/20/23 0022 04/21/23 0131  NA SPECIMEN HEMOLYZED. HEMOLYSIS MAY AFFECT INTEGRITY OF RESULTS. 137 136 135 135  K SPECIMEN HEMOLYZED. HEMOLYSIS MAY AFFECT INTEGRITY OF RESULTS. 4.3 4.4 4.5 5.7*  CL SPECIMEN HEMOLYZED. HEMOLYSIS MAY AFFECT INTEGRITY OF RESULTS. 98 91* 94* 94*  CO2 SPECIMEN HEMOLYZED. HEMOLYSIS MAY AFFECT INTEGRITY OF RESULTS. 23 28 27 26   GLUCOSE SPECIMEN HEMOLYZED. HEMOLYSIS MAY AFFECT INTEGRITY OF RESULTS. 133* 160* 148* 139*  BUN SPECIMEN HEMOLYZED. HEMOLYSIS MAY AFFECT INTEGRITY OF RESULTS. 110* 58* 87* 137*  CALCIUM SPECIMEN HEMOLYZED. HEMOLYSIS MAY AFFECT INTEGRITY OF RESULTS. 9.6 9.1 8.7* 8.5*  CREATININE SPECIMEN HEMOLYZED. HEMOLYSIS MAY AFFECT INTEGRITY OF RESULTS. 6.24* 3.38* 4.38* 5.96*  GFRNONAA SPECIMEN HEMOLYZED. HEMOLYSIS MAY AFFECT INTEGRITY OF RESULTS. 7* 15* 11* 7*  GFRAA SPECIMEN HEMOLYZED. HEMOLYSIS MAY AFFECT INTEGRITY OF RESULTS.  --   --   --   --     LIVER FUNCTION TESTS: Recent Labs    04/01/23 1602 04/03/23 0253 04/06/23 0336 04/06/23 2031 04/11/23 0804 04/12/23 0427 04/18/23 1232 04/19/23 0832  BILITOT 0.8  --  0.7  --   --   --  0.2* 0.8  AST 14*  --  15  --   --   --  127* 80*  ALT 17  --  15  --   --   --  26 28  ALKPHOS 46  --  49  --   --   --   61 69  PROT 5.1*  --  5.5*  --   --   --  6.1* 6.2*  ALBUMIN 2.2*   < > 2.5*  2.4*   < > 2.0* 2.2* 2.4* 2.5*   < > = values in this interval not displayed.     Assessment and Plan:  62 y.o. female inpatient. History of Presented to [facility] on 7.5.24 with URI symptoms X 1 week. Found to be hypoxic in respiratory failure, acute heart failure and AKI. Patient was transferred to [facility] and then ultimately to [facility] for dialysis. Hospital stay complicated by ileus, difficulty weaning from vent and anemia requiring blood transfusions. Patient is trached ( 7.23.24) and vent dependent. With a triple lumen temp cath placed on 7.19.24. Team is requesting a G tube and tunneled dialysis catheter in preparation to discharge to a [facility]ACH.   WBC is 12.4 ( down trending). BUN 137, Cr 5.96, GFR < 5. Patient is on subcutaneous prophylactic dose of lheparin. Allergies include cipro.   IR consulted for possible gastrostomy tube placement and tunneled HD catheter placement. Case has been reviewed and procedure approved by Dr. Deanne Coffer.  Patient tentatively scheduled for 8.7.24.  Team instructed to: Keep Patient to be NPO after midnight Hold prophylactic anticoagulation 24 hours prior to scheduled procedure. Place a peripheral line for IV access  IR will call patient when ready.  Risks and benefits image guided gastrostomy tube placement was discussed with the patient including, but not limited to the need for a barium enema during the procedure, bleeding, infection, peritonitis and/or damage to adjacent structures.  All of the patient's daughter questions were answered, patient 's daughter is agreeable to proceed.  Consent signed and in chart.    Thank you for this interesting consult.  I greatly enjoyed meeting Tamara Castro and look forward to participating in their care.  A copy of this report was sent to the requesting provider on this date.  Electronically Signed:  Alene Mires,  NP 04/21/2023, 11:13 AM   I spent a total of 40 Minutes    in face to face in clinical consultation, greater than 50% of which was counseling/coordinating care for G tube and tunneled HD catheter placement

## 2023-04-21 NOTE — Progress Notes (Addendum)
Received patient in bed.She has a trach .Patient nods appropriately.Consent verified.  Access used : Right HD internal jugular catheter. Dressing on date. Artrial access has a positional problem.  Duration of treatment : 2.46 hours out of 3.5 hours prescribed.  Fluid removed: 1.9 liters out of a minimum uf goal of 2 liters.  Hemo comment:Once treatment started,she developed difficulty of breathing,that made the HD machine beeped a lot,patient's nurse and R.T at the bedside.Her HD arterial access started to have positional problem at this time,very hard to pull but flushing in well,but when she was relaxed ,I pulled something from her arterial access,though it was sluggish ,continued treatment on reverse catheter connection,was able to run treatment smoothly for the next 1.5 hour,then circuit clogged,Treatment stopped when circuit clogged  due to bubbles in the air chamber on the third HD cartridge set-up,she has had 44 minutes left on her treatment.A total of 5.5 hours spend with the patient.  Hand off to the patient's nurse.

## 2023-04-21 NOTE — Progress Notes (Addendum)
NAME:  Tamara Castro, MRN:  [id number], DOB:  Aug 29, 1961, LOS: 20 ADMISSION DATE:  04/01/2023, CONSULTATION DATE:  04/01/2023 REFERRING MD:  Salvadore Dom, CHIEF COMPLAINT:  Respiratory Failure   History of Present Illness:  HPI obtained from EMR review as patient is intubated and sedated on MV.    34 yoF with PMH as below who presented to [facility] on 7/5 after 1 week hx of URI symptoms admitted for acute hypoxic and hypercarbic respiratory failure 2/2 RLL PNA requiring intubation, septic shock, AKI and acute heart failure.  Found to have pBNP of 49k with elevated troponin's thought secondary to demand ischemia with echo with normal EF, reduced RV, indeterminate diastolic function.  CTA neg for PE 7/5.  CTH neg 7/9.  Covered with azithromycin/ ceftriaxone, switched to cefepime on 7/7.  Shock and AKI improved and extubated 7/7 but required BiPAP.  Required reintubation 7/10 for respiratory failure.  Increasing WBC 7/13, placed on doxycyline switched to vancomycin with elevated PCT with CXR showing increased bibasilar atelectasis vs infiltrates with increasing pleural effusions.  Initial blood culture x1 w/ stap epi, thought contaminate with repeat BC 7/7 negative.  Some concern for ileus with high OGT residuals with CT a/p showing slightly worse dilation of suspected chronic ileus with prior surgical changes but no transition point, possible emesis of TF since held, remains, NPO on reglan.  Patient has failed SBT for several days, due to apnea despite decreased sedation.  Transferred to [facility] ICU for further care, admitted to PCCM.  Then transferred to ICU and [facility] for acute renal failure and IHD.  Doing better now, tolerating SBT and some trach collar time.  Pertinent  Medical History  Tobacco abuse,  COPD, chronic constipation, prior colectomy in 1990's 2/2 diverticulitis, fibromyalgia (on disability since 2008), depression, anxiety, neuropathy   Home meds> xanax (1mg  QID), lexapro, gabapentin, anoro elliptia,  zofran  Significant Hospital Events: Including procedures, antibiotic start and stop dates in addition to other pertinent events   7/5 admitted [facility] CTX and azith started 7/7 extubated. Vanc added and cefepime started; CTX stopped 7/8 vanc stopped 7/10 reintubated  7/16 tx to [facility]. Cefepime stopped 7/17 attempting lasix. Creatinine worse.  Echo EF 60-65%no WMA RV nml  7/18 cr worse after lasix. Weaning but on-going ileus and cr rising presenting obstacle to extubation Trying neostigmine.  7/19 scr worse in spite of holding lasix. Still on low dose precedex. Mental status and renal fxn still barrier to extubation. Nephro consulted > started CRRT.  HD catheter placed. 7/20 [place] heparin stopped d/t bleeding around HD cath. Got DDAVP.  7/21 resumes cefepime for worsening leukocytosis.  7/22 volumes ok w/ SBT but has sig accessory use  7/23- Trach performed at bedside. CRRT holiday per nephrology. Increased Xanax to home dose 1mg  QID- added seroquel 25mg  BID 7/24- has successfully weaned off propofol and converted to precedex infusion. Tolerating 4+ hours of SBT 7/25- discontinued precedex and fentanyl with as needed fentanyl pushes for pain. Tolerated SBT for 3 hours before failing and being placed on full vent support. Oliguric with rising serum creatinine- nephrology requests transfer to [facility] for restarting dialysis  7/26 multiple emesis, NG to LIS, EEG neg , HD session 7/27 DC Seroquel, decrease Xanax 0.5 twice daily 7/28 Code stroke called for unequal pupils this a.m., head CT negative, seen by neurology; MRI neg 7/31 Hgb drop to 5.5, 2 u prbc, DIC panel Lower extremity Dopplers 8/3 >>  CT abdomen 8/3 >> near complete resolution of pleural  effusions, there is a safe window for PEG tube placement  Interim History / Subjective:   Doing well with PSV trials Did about 45 minutes with trach collar before becoming somewhat hypoxic Afebrile  Addendum 11:54 AM Not doing as well since  initiation of HD this morning, pressures okay but looks tachypneic on the vent.  Objective   Blood pressure (!) 178/91, pulse (!) 119, temperature 98.8 F (37.1 C), temperature source Oral, resp. rate (!) 29, height 5\' 5"  (1.651 m), weight 74.5 kg, SpO2 95%.      Vent Mode: PRVC FiO2 (%):  [40 %-70 %] 70 % Set Rate:  [14 bmp] 14 bmp Vt Set:  [450 mL] 450 mL PEEP:  [5 cmH20] 5 cmH20 Pressure Support:  [5 cmH20] 5 cmH20 Plateau Pressure:  [18 cmH20] 18 cmH20   Intake/Output Summary (Last 24 hours) at 04/21/2023 1610 Last data filed at 04/21/2023 0800 Gross per 24 hour  Intake 1260 ml  Output --  Net 1260 ml   Filed Weights   04/20/23 0349 04/21/23 0500 04/21/23 0836  Weight: 74.9 kg 74.5 kg 74.5 kg    Examination: Sitting upright Trach connected to vent PRVC 40% rate of 14 PEEP of 5, lungs clear Heart rate is tachycardic, rhythm is regular, strong radial pulse, minimal edema upper extremities Abdomen is soft and nontender Skin is warm and dry with diffuse purpura Alert, answers questions, follows simple instructions  Potassium 5.7 Bicarb 26 WBC 12.4, downtrending Hemoglobin 7.6 Blood cultures no growth x 4 days Tracheal aspirate with mixed flora including rare yeast, culture pending  Assessment & Plan:   Admitted for community-acquired pneumonia/Moraxella, treatment completed -Pulmonary hygiene, following off antibiotics - Afebrile x 72 hours - Leukocytosis improving -Continue to follow cultures to completion -Lower extremity Dopplers negative for DVT -Following off antibiotics  In-vitro hemolysis -Continue to draw necessary labs from peripheral site -Intermittent hemoglobin, platelets.  Stable 8/4 -Low suspicion DIC  Ileus, resolved Protein calorie malnutrition POA -Continue tube feeding -CT abdomen performed, she has a good approach for PEG tube placement.  Plan for this week  Prolonged mechanical ventilation requiring tracheostomy - CXR for respiratory  distress during dialysis this morning -Continue to push SBT, PSV. -ATC as tolerated -Continue pulmonary hygiene, aspiration precautions  History of COPD -Continue BD, ICS as ordered  History of severe anxiety -Continue gabapentin 200 mg nightly -Continue clonazepam 0.5 mg twice daily  Septic/ischemic ATN on iHD -Now tolerating intermittent HD.  Appreciate nephrology management -Will need tunneled HD catheter prior to transfer from ICU  Best Practice (right click and "Reselect all SmartList Selections" daily)   Diet/type: TF DVT prophylaxis: heparin GI prophylaxis: PPI Lines: Central line, Dialysis Catheter, and yes and it is still needed Foley:  Yes, and it is still needed Code Status:  DNR Last date of multidisciplinary goals of care discussion [04/09/2023]-planning for LTAC  Marrianne Mood MD 04/21/2023, 9:26 AM

## 2023-04-21 NOTE — Progress Notes (Signed)
OT Cancellation Note  Patient Details Name: MILLIANI JORY MRN: [id number] DOB: January 14, 1961   Cancelled Treatment:    Reason Eval/Treat Not Completed: Patient at procedure or test/ unavailable (2 attempts made, pt OTF in HD. OT to follow up as schedule allows.)  Donia Pounds 04/21/2023, 1:38 PM

## 2023-04-21 NOTE — Progress Notes (Addendum)
PT Cancellation Note  Patient Details Name: Tamara Castro MRN: [id number] DOB: Mar 31, 1961   Cancelled Treatment:    Reason Eval/Treat Not Completed: Patient at procedure or test/unavailable (HD) 0840  Checked back 1307 and pt still on HD. Will plan to attempt next date    B  04/21/2023, 8:40 AM Merryl Hacker, PT Acute Rehabilitation Services Office: [phone]

## 2023-04-22 DIAGNOSIS — J9601 Acute respiratory failure with hypoxia: Secondary | ICD-10-CM | POA: Diagnosis not present

## 2023-04-22 LAB — TYPE AND SCREEN
ABO/RH(D): A POS
Antibody Screen: NEGATIVE
Unit division: 0

## 2023-04-22 LAB — CBC
HCT: 25.5 % — ABNORMAL LOW (ref 36.0–46.0)
Hemoglobin: 7.9 g/dL — ABNORMAL LOW (ref 12.0–15.0)
MCH: 28.9 pg (ref 26.0–34.0)
MCHC: 31 g/dL (ref 30.0–36.0)
MCV: 93.4 fL (ref 80.0–100.0)
Platelets: 209 10*3/uL (ref 150–400)
RBC: 2.73 MIL/uL — ABNORMAL LOW (ref 3.87–5.11)
RDW: 17.6 % — ABNORMAL HIGH (ref 11.5–15.5)
WBC: 14 10*3/uL — ABNORMAL HIGH (ref 4.0–10.5)
nRBC: 0 % (ref 0.0–0.2)

## 2023-04-22 LAB — GLUCOSE, CAPILLARY
Glucose-Capillary: 116 mg/dL — ABNORMAL HIGH (ref 70–99)
Glucose-Capillary: 118 mg/dL — ABNORMAL HIGH (ref 70–99)
Glucose-Capillary: 118 mg/dL — ABNORMAL HIGH (ref 70–99)
Glucose-Capillary: 141 mg/dL — ABNORMAL HIGH (ref 70–99)
Glucose-Capillary: 116 mg/dL — ABNORMAL HIGH (ref 70–99)

## 2023-04-22 LAB — FERRITIN: Ferritin: 540 ng/mL — ABNORMAL HIGH (ref 11–307)

## 2023-04-22 LAB — BPAM RBC
Blood Product Expiration Date: 202408132359
ISSUE DATE / TIME: 202408060626
Unit Type and Rh: 6200

## 2023-04-22 LAB — IRON AND TIBC
Iron: 63 ug/dL (ref 28–170)
Saturation Ratios: 23 % (ref 10.4–31.8)
TIBC: 279 ug/dL (ref 250–450)
UIBC: 216 ug/dL

## 2023-04-22 LAB — CULTURE, BLOOD (ROUTINE X 2)
Culture: NO GROWTH
Culture: NO GROWTH
Special Requests: ADEQUATE
Special Requests: ADEQUATE

## 2023-04-22 LAB — PREPARE RBC (CROSSMATCH)

## 2023-04-22 MED ORDER — ALBUMIN HUMAN 25 % IV SOLN: 12.5000 g | Freq: Once | INTRAVENOUS | Status: DC

## 2023-04-22 MED ORDER — SODIUM ZIRCONIUM CYCLOSILICATE 10 G PO PACK
10.0000 g | PACK | Freq: Two times a day (BID) | ORAL | Status: AC
  Administered 2023-04-22 – 2023-04-23 (×4): 10 g
  Filled 2023-04-22 (×4): qty 1

## 2023-04-22 MED ORDER — SODIUM CHLORIDE 0.9% IV SOLUTION: Freq: Once | INTRAVENOUS | Status: AC

## 2023-04-22 MED ORDER — CHLORHEXIDINE GLUCONATE CLOTH 2 % EX PADS
6.0000 | MEDICATED_PAD | Freq: Every day | CUTANEOUS | Status: DC
  Administered 2023-04-23 – 2023-04-24 (×2): 6 via TOPICAL

## 2023-04-22 NOTE — Progress Notes (Addendum)
NAME:  Tamara Castro, MRN:  [id number], DOB:  04-16-1961, LOS: 21 ADMISSION DATE:  04/01/2023, CONSULTATION DATE:  04/01/2023 REFERRING MD:  Salvadore Dom, CHIEF COMPLAINT:  Respiratory Failure   History of Present Illness:  HPI obtained from EMR review as patient is intubated and sedated on MV.    62 yoF with PMH as below who presented to [facility] on 7/5 after 1 week hx of URI symptoms admitted for acute hypoxic and hypercarbic respiratory failure 2/2 RLL PNA requiring intubation, septic shock, AKI and acute heart failure.  Found to have pBNP of 49k with elevated troponin's thought secondary to demand ischemia with echo with normal EF, reduced RV, indeterminate diastolic function.  CTA neg for PE 7/5.  CTH neg 7/9.  Covered with azithromycin/ ceftriaxone, switched to cefepime on 7/7.  Shock and AKI improved and extubated 7/7 but required BiPAP.  Required reintubation 7/10 for respiratory failure.  Increasing WBC 7/13, placed on doxycyline switched to vancomycin with elevated PCT with CXR showing increased bibasilar atelectasis vs infiltrates with increasing pleural effusions.  Initial blood culture x1 w/ stap epi, thought contaminate with repeat BC 7/7 negative.  Some concern for ileus with high OGT residuals with CT a/p showing slightly worse dilation of suspected chronic ileus with prior surgical changes but no transition point, possible emesis of TF since held, remains, NPO on reglan.  Patient has failed SBT for several days, due to apnea despite decreased sedation.  Transferred to [facility] ICU for further care, admitted to PCCM.  Then transferred to ICU and [facility] for acute renal failure and IHD.  Doing better now, tolerating SBT and some trach collar time.  Pertinent  Medical History  Tobacco abuse,  COPD, chronic constipation, prior colectomy in 1990's 2/2 diverticulitis, fibromyalgia (on disability since 2008), depression, anxiety, neuropathy   Home meds> xanax (1mg  QID), lexapro, gabapentin, anoro elliptia,  zofran  Significant Hospital Events: Including procedures, antibiotic start and stop dates in addition to other pertinent events   7/5 admitted [facility] CTX and azith started 7/7 extubated. Vanc added and cefepime started; CTX stopped 7/8 vanc stopped 7/10 reintubated  7/16 tx to [facility]. Cefepime stopped 7/17 attempting lasix. Creatinine worse.  Echo EF 60-65%no WMA RV nml  7/18 cr worse after lasix. Weaning but on-going ileus and cr rising presenting obstacle to extubation Trying neostigmine.  7/19 scr worse in spite of holding lasix. Still on low dose precedex. Mental status and renal fxn still barrier to extubation. Nephro consulted > started CRRT.  HD catheter placed. 7/20 [place] heparin stopped d/t bleeding around HD cath. Got DDAVP.  7/21 resumes cefepime for worsening leukocytosis.  7/22 volumes ok w/ SBT but has sig accessory use  7/23- Trach performed at bedside. CRRT holiday per nephrology. Increased Xanax to home dose 1mg  QID- added seroquel 25mg  BID 7/24- has successfully weaned off propofol and converted to precedex infusion. Tolerating 4+ hours of SBT 7/25- discontinued precedex and fentanyl with as needed fentanyl pushes for pain. Tolerated SBT for 3 hours before failing and being placed on full vent support. Oliguric with rising serum creatinine- nephrology requests transfer to [facility] for restarting dialysis  7/26 multiple emesis, NG to LIS, EEG neg , HD session 7/27 DC Seroquel, decrease Xanax 0.5 twice daily 7/28 Code stroke called for unequal pupils this a.m., head CT negative, seen by neurology; MRI neg 7/31 Hgb drop to 5.5, 2 u prbc, DIC panel Lower extremity Dopplers 8/3 >>  CT abdomen 8/3 >> near complete resolution of pleural  effusions, there is a safe window for PEG tube placement  Interim History / Subjective:   Gets pretty tachypneic and uncomfortable appearing during trach collar time. Afebrile, stable vent settings, 1900 of ultrafiltrate with dialysis yesterday. 1  unit PRBC for hemoglobin 6.3 overnight  Objective   Blood pressure 119/65, pulse 99, temperature 99.4 F (37.4 C), temperature source Oral, resp. rate (!) 24, height 5\' 5"  (1.651 m), weight 72.6 kg, SpO2 100%.      Vent Mode: PRVC FiO2 (%):  [40 %-70 %] 40 % Set Rate:  [14 bmp] 14 bmp Vt Set:  [400 mL-450 mL] 400 mL PEEP:  [5 cmH20] 5 cmH20 Plateau Pressure:  [16 cmH20] 16 cmH20   Intake/Output Summary (Last 24 hours) at 04/22/2023 0810 Last data filed at 04/22/2023 0600 Gross per 24 hour  Intake 1220 ml  Output 2085 ml  Net -865 ml   Filed Weights   04/21/23 0500 04/21/23 0836 04/22/23 0500  Weight: 74.5 kg 74.5 kg 72.6 kg    Examination: Sitting upright, no distress Trach connected to vent PRVC 40% rate of 14 PEEP of 5, lungs clear Heart rate is tachycardic, rhythm is regular, strong radial pulse, minimal edema upper extremities Abdomen is soft and nontender Skin is warm and dry with diffuse purpura Alert, answers questions, follows simple instructions  Sodium 133 Potassium 5.2 Bicarb 26 BUN 78 WBC 13.2 Hemoglobin 6.3 Platelets 193 Blood cultures no growth x 5 days final  Assessment & Plan:   Admitted for community-acquired pneumonia/Moraxella, treatment completed - Good pulmonary hygiene - Off antibiotics, afebrile, stable leukocytosis - Blood and respiratory cultures negative  Anemia of critical illness - Workup for intravascular hemolysis was negative - Low suspicion for DIC - No obvious bleeding - 1 unit PRBC for hemoglobin 6.3 overnight - Iron, TIBC, ferritin ordered  Prolonged mechanical ventilation requiring tracheostomy - Push SBT, PSV - ATC as tolerated - Pulmonary hygiene and aspiration precautions  History of COPD -Continue BD, ICS as ordered  History of severe anxiety -Continue gabapentin 200 mg nightly -Continue clonazepam 0.5 mg twice daily  Septic/ischemic ATN on iHD -Now tolerating intermittent HD.  Appreciate nephrology  management -Will need tunneled HD catheter prior to transfer from ICU  Ileus, resolved Protein calorie malnutrition POA - Continue tube feeds - Gastrostomy tube by IR tomorrow - Fecal management system in place  In-vitro hemolysis - Drawn necessary labs from peripheral sites  Best Practice (right click and "Reselect all SmartList Selections" daily)   Diet/type: TF DVT prophylaxis: heparin GI prophylaxis: PPI Lines: Dialysis Catheter and yes and it is still needed Foley:  N/A Code Status:  DNR Last date of multidisciplinary goals of care discussion [04/09/2023]-planning for LTAC  Marrianne Mood MD 04/22/2023, 8:10 AM     Attending Attestation Note:  I have examined patient, reviewed labs, studies and notes.   Interval events: She has been a bit more fatigued Had some trouble with PSV on 8/5 Hemoglobin 6.3, received 1 unit PRBC this morning Received partial hemodialysis 8/5, 1.9 L removed.  Today's Vitals   04/22/23 1142 04/22/23 1200 04/22/23 1300 04/22/23 1400  BP: 101/79 128/69 124/62 130/74  Pulse: 94 (!) 108 91 (!) 104  Resp: (!) 25 (!) 27 19 (!) 23  Temp:      TempSrc:      SpO2: 100% 99% 96% 97%  Weight:      Height:      PainSc:       Body mass index is 26.63 kg/m.;  Obese ill-appearing woman in no distress laying in bed.  She is ventilated.  She has bilateral rhonchi and some mild tachypnea.  Heart tachycardic, regular without a murmur.  Abdomen obese, somewhat protuberant, nontender with positive bowel sounds.  1+ lower extremity edema.  She is awake, nods to questions, tracks and follows commands.  62 year old woman who had critical illness hospitalization for Moraxella CAP, associated septic shock with respiratory failure and acute renal failure.  She has been debilitated since that illness, remains ventilated and with high support needs including hemodialysis.  Now tolerating intermittent HD  Plans: Continue to follow her WBC and fever curve off  antibiotics She is going to have a tunneled HD catheter on 8/7.  Continue HD as per nephrology plans She will have a PEG tube placed on 8/7 Continue to follow her CBC.  Check iron studies.  PRBC given on 8/6 Continue efforts at PSV and ATC as she can tolerate  Independent critical care time is 31 minutes.   Levy Pupa, MD, PhD 04/22/2023, 2:31 PM [facility] [phone] or if no answer [phone]

## 2023-04-22 NOTE — Progress Notes (Signed)
SLP Cancellation Note  Patient Details Name: Tamara Castro MRN: [id number] DOB: Aug 30, 1961   Cancelled treatment:       Reason Eval/Treat Not Completed: Other (comment). Awaiting sufficient tolerance of ATC for PMSV trials. Following chart.    , Riley Nearing 04/22/2023, 7:30 AM

## 2023-04-22 NOTE — Progress Notes (Signed)
eLink Physician-Brief Progress Note Patient Name: Tamara Castro DOB: [birth year] MRN: [id number]   Date of Service  04/22/2023  HPI/Events of Note  HGB 6.3  potassium 5.2, pt has cortrak  62 year old critically ill woman, debilitated following treatment for Moraxella community-acquired pneumonia, associated septic shock, respiratory failure, acute renal failure.  Remains ventilated and is requiring intermittent HD.    Last HD done on 5 th. Wednesday going for tunneled HD cath.  Camera: On Vent, 50%. Via trachVs stable   eICU Interventions  1 PRBC ordered  Discussed with RN. No bleeding.      Intervention Category Intermediate Interventions: Other:;Electrolyte abnormality - evaluation and management  Ranee Gosselin 04/22/2023, 2:24 AM

## 2023-04-22 NOTE — Plan of Care (Signed)
°  Problem: Health Behavior/Discharge Planning: °Goal: Ability to manage health-related needs will improve °Outcome: Progressing °  °Problem: Clinical Measurements: °Goal: Cardiovascular complication will be avoided °Outcome: Progressing °  °Problem: Activity: °Goal: Risk for activity intolerance will decrease °Outcome: Progressing °  °Problem: Nutrition: °Goal: Adequate nutrition will be maintained °Outcome: Progressing °  °Problem: Coping: °Goal: Level of anxiety will decrease °Outcome: Progressing °  °

## 2023-04-22 NOTE — Progress Notes (Signed)
[facility] Progress Note   Assessment/ Plan:   Acute kidney injury - b/l creat 1.0 from jan 2023. Creat here was 1.6 on admission in setting of pneumonia, hypotension and septic shock. Creat peaked at 1.7 and then returned to 1.0 on 7/12. Renal failure then worsened again up to 3.84 and CRRT started 7/19. AKI felt to be related to hypotension and IV contrast +/- diuretics. Has a two day CRRT holiday which showed min UOP.   - on MWF IHD  next tomorrow ideally after new access   - converting nontunneled to tunneled HD cath when possible-  slated for Wednesday   - OK for heparin- no evidence of HIT, in vitro hemolysis Sepsis/ shock/ hypotension - initial dx on admission was pna and septic shock, now resolved.  last pressors were on 7/24.  Acute hypoxic respiratory failure - due to COPD/ pna, on vent w/ trach per CCM. F/u CXR 7/23 was negative for edema. Attempting to wean vent Volume excess/ lower extremity edema-  much improved Anemia - Hb 7- 9 range, tranfuse prn-  needs blood today 8/6. Have added ESA  Anxiety/ depression Dispo-  possibly planning for LTAC Hyperkalemia-  has been an issue -  came down with HD but since is still above 5 and getting blood today will dose with some lokelma as trying to wait til new access to get HD tomorrow   Subjective:    Seen in room.  Difficulty with HD noted mostly due to access although pt also SOB-  ended up removing 1900-  hgb low this AM-  getting blood-  no particular complaints-  no UOP   Objective:   BP 119/65   Pulse 99   Temp 99 F (37.2 C) (Oral)   Resp (!) 24   Ht 5\' 5"  (1.651 m)   Wt 72.6 kg   SpO2 100%   BMI 26.63 kg/m   Intake/Output Summary (Last 24 hours) at 04/22/2023 0715 Last data filed at 04/22/2023 0600 Gross per 24 hour  Intake 1320 ml  Output 2085 ml  Net -765 ml   Weight change: 0 kg  Physical Exam: Gen:ill-appearing but awake-  seems to under stand what I am saying to her CVS: RRR Resp: + trach,  coarse mech bilaterally Abd: + soft Ext: min edema, UE greater than LE, improving ACCESS: R internal jugular nontunneled HD Cath  Imaging: DG CHEST PORT 1 VIEW  Result Date: 04/21/2023 CLINICAL DATA:  Respiratory failure. EXAM: PORTABLE CHEST 1 VIEW COMPARISON:  04/15/2023 FINDINGS: Tracheostomy tube in satisfactory position. Feeding tube with the tip coursing below the diaphragm excluded from the field of view. Lungs are hyperinflated as can be seen with COPD. Prominent stable interstitial markings at the lung bases bilaterally. No new focal parenchymal opacity to suggest pneumonia. No pleural effusion or pneumothorax. Heart and mediastinal contours are unremarkable. No acute osseous abnormality. IMPRESSION: 1. COPD. 2. No acute cardiopulmonary disease. Electronically Signed   By: Elige Ko M.D.   On: 04/21/2023 12:18    Labs: BMET Recent Labs  Lab 04/16/23 0421 04/17/23 0530 04/18/23 1232 04/19/23 0832 04/20/23 0022 04/21/23 0131 04/22/23 0056  NA 135 SPECIMEN HEMOLYZED. HEMOLYSIS MAY AFFECT INTEGRITY OF RESULTS. 137 136 135 135 133*  K 4.1 SPECIMEN HEMOLYZED. HEMOLYSIS MAY AFFECT INTEGRITY OF RESULTS. 4.3 4.4 4.5 5.7* 5.2*  CL 96* SPECIMEN HEMOLYZED. HEMOLYSIS MAY AFFECT INTEGRITY OF RESULTS. 98 91* 94* 94* 93*  CO2 27 SPECIMEN HEMOLYZED. HEMOLYSIS MAY AFFECT INTEGRITY OF RESULTS. 23  28 27 26 26   GLUCOSE 145* SPECIMEN HEMOLYZED. HEMOLYSIS MAY AFFECT INTEGRITY OF RESULTS. 133* 160* 148* 139* 141*  BUN 62* SPECIMEN HEMOLYZED. HEMOLYSIS MAY AFFECT INTEGRITY OF RESULTS. 110* 58* 87* 137* 78*  CREATININE 4.53* SPECIMEN HEMOLYZED. HEMOLYSIS MAY AFFECT INTEGRITY OF RESULTS. 6.24* 3.38* 4.38* 5.96* 3.62*  CALCIUM 8.3* SPECIMEN HEMOLYZED. HEMOLYSIS MAY AFFECT INTEGRITY OF RESULTS. 9.6 9.1 8.7* 8.5* 8.1*   CBC Recent Labs  Lab 04/19/23 0648 04/20/23 0022 04/21/23 0131 04/22/23 0056  WBC 23.7* 16.7* 12.4* 13.2*  HGB 9.5* 8.2* 7.6* 6.3*  HCT 28.5* 25.5* 24.5* 20.9*  MCV 89.3 91.1  93.2 94.1  PLT 206  203 210 227 193    Medications:     arformoterol  15 mcg Nebulization BID   atorvastatin  20 mg Per Tube Daily   budesonide (PULMICORT) nebulizer solution  0.5 mg Nebulization BID   Chlorhexidine Gluconate Cloth  6 each Topical Q0600   clonazePAM  0.5 mg Per Tube BID   darbepoetin (ARANESP) injection - DIALYSIS  150 mcg Subcutaneous Q Mon-1800   docusate  100 mg Per Tube BID   escitalopram  10 mg Per Tube Daily   gabapentin  200 mg Per Tube QHS   Gerhardt's butt cream   Topical BID   heparin injection (subcutaneous)  5,000 Units Subcutaneous Q8H   insulin aspart  0-9 Units Subcutaneous Q4H   multivitamin  1 tablet Per Tube QHS   mouth rinse  15 mL Mouth Rinse Q2H   pantoprazole (PROTONIX) IV  40 mg Intravenous Q24H   polyethylene glycol  17 g Per NG tube Daily   revefenacin  175 mcg Nebulization Daily   sodium chloride flush  10-40 mL Intracatheter Q12H   sodium chloride flush  3 mL Intravenous Q12H    A   04/22/2023, 7:15 AM

## 2023-04-22 NOTE — Progress Notes (Signed)
Physical Therapy Treatment Patient Details Name: Tamara Castro MRN: [id number] DOB: [birth year] Today's Date: 04/22/2023   History of Present Illness 62 yo female admitted to [facility] 7/5 with RLL PNA, sepsis and acute HF. Intubated (7/5-7/7, bipap 7/7-7/9, 7/10-present). 7/16 transfer to [facility]. 7/19-7/23 CRRT. 7/23 trach. 7/25 transfer to [facility] for HD. 7/26 EEG with encephalopathy. PMhx: COPD, tobacco abuse, ileus, anxiety, CAD    PT Comments  Pt pleasant and willing to participate with pt actively moving all extremities and pulling trunk off surface. Pt able to sit in full chair egress with feet on floor and attempt standing with total +2 assist. Encouraged continued HEP and upright positioning. Will continue to follow.  PRVC 40%, peep 5, SPO2 92-100% HR105    If plan is discharge home, recommend the following: Two people to help with walking and/or transfers;Two people to help with bathing/dressing/bathroom;Direct supervision/assist for medications management;Assistance with feeding;Assistance with cooking/housework;Direct supervision/assist for financial management;Assist for transportation;Help with stairs or ramp for entrance   Can travel by private vehicle     No  Equipment Recommendations  Wheelchair cushion (measurements PT);Wheelchair (measurements PT);Hospital bed;Other (comment) (hoyer)    Recommendations for Other Services       Precautions / Restrictions Precautions Precautions: Other (comment);Fall Precaution Comments: vent, trach, cortrak, flexiseal     Mobility  Bed Mobility Overal bed mobility: Needs Assistance Bed Mobility: Supine to Sit, Sit to Supine, Rolling Rolling: Max assist   Supine to sit: Total assist Sit to supine: Total assist   General bed mobility comments: total assist with bed in full chair egress, pt able to lift arms to rails with min cues and mod assit for LUE. Pt able to perform anterior translation with guarding and sit with LUE  support 2 min without additional physical assist    Transfers Overall transfer level: Needs assistance   Transfers: Sit to/from Stand Sit to Stand: Total assist, +2 physical assistance           General transfer comment: bil knees blocked with pad assist at sacrum, able to clear surface but pt maintains flexed posture    Ambulation/Gait                   Stairs             Wheelchair Mobility     Tilt Bed    Modified Rankin (Stroke Patients Only)       Balance                                            Cognition Arousal/Alertness: Awake/alert Behavior During Therapy: Flat affect Overall Cognitive Status: Difficult to assess                                 General Comments: pt shaking head yes/no providing limited hand gestures and trying to mouth words but very limited amount of things mouthed were discernable        Exercises General Exercises - Lower Extremity Short Arc Quad: AROM, Both, 10 reps, Seated Heel Slides: AAROM, Both, 10 reps, Seated    General Comments        Pertinent Vitals/Pain Pain Assessment Pain Assessment: CPOT Facial Expression: Relaxed, neutral Body Movements: Protection Muscle Tension: Tense, rigid Compliance with ventilator (intubated pts.): Tolerating ventilator or movement Vocalization (  extubated pts.): N/A CPOT Total: 2 Pain Location: grimacing with supine and pericare Pain Descriptors / Indicators: Grimacing Pain Intervention(s): Limited activity within patient's tolerance, Repositioned, Monitored during session    Home Living                          Prior Function            PT Goals (current goals can now be found in the care plan section) Progress towards PT goals: Progressing toward goals    Frequency    Min 1X/week      PT Plan Current plan remains appropriate    Co-evaluation              AM-PAC PT "6 Clicks" Mobility   Outcome  Measure  Help needed turning from your back to your side while in a flat bed without using bedrails?: Total Help needed moving from lying on your back to sitting on the side of a flat bed without using bedrails?: Total Help needed moving to and from a bed to a chair (including a wheelchair)?: Total Help needed standing up from a chair using your arms (e.g., wheelchair or bedside chair)?: Total Help needed to walk in hospital room?: Total Help needed climbing 3-5 steps with a railing? : Total 6 Click Score: 6    End of Session   Activity Tolerance: Patient tolerated treatment well Patient left: in bed;with call bell/phone within reach;with bed alarm set Nurse Communication: Mobility status;Need for lift equipment PT Visit Diagnosis: Other abnormalities of gait and mobility (R26.89);Difficulty in walking, not elsewhere classified (R26.2);Muscle weakness (generalized) (M62.81)     Time: 6295-2841 PT Time Calculation (min) (ACUTE ONLY): 29 min  Charges:    $Therapeutic Activity: 23-37 mins PT General Charges $$ ACUTE PT VISIT: 1 Visit                     Merryl Hacker, PT Acute Rehabilitation Services Office: [phone]    [name]  04/22/2023, 9:14 AM

## 2023-04-23 ENCOUNTER — Inpatient Hospital Stay (HOSPITAL_COMMUNITY): Payer: 59

## 2023-04-23 DIAGNOSIS — J9601 Acute respiratory failure with hypoxia: Secondary | ICD-10-CM | POA: Diagnosis not present

## 2023-04-23 HISTORY — PX: IR GASTROSTOMY TUBE MOD SED: IMG625

## 2023-04-23 HISTORY — PX: IR FLUORO GUIDE CV LINE RIGHT: IMG2283

## 2023-04-23 HISTORY — PX: IR US GUIDE VASC ACCESS RIGHT: IMG2390

## 2023-04-23 LAB — GLUCOSE, CAPILLARY
Glucose-Capillary: 116 mg/dL — ABNORMAL HIGH (ref 70–99)
Glucose-Capillary: 134 mg/dL — ABNORMAL HIGH (ref 70–99)
Glucose-Capillary: 93 mg/dL (ref 70–99)
Glucose-Capillary: 118 mg/dL — ABNORMAL HIGH (ref 70–99)
Glucose-Capillary: 92 mg/dL (ref 70–99)

## 2023-04-23 MED ORDER — HEPARIN SODIUM (PORCINE) 1000 UNIT/ML IJ SOLN
INTRAMUSCULAR | Status: AC
  Filled 2023-04-23: qty 10

## 2023-04-23 MED ORDER — FENTANYL CITRATE (PF) 100 MCG/2ML IJ SOLN
INTRAMUSCULAR | Status: AC | PRN
  Administered 2023-04-23 (×3): 50 ug via INTRAVENOUS

## 2023-04-23 MED ORDER — MIDAZOLAM HCL 2 MG/2ML IJ SOLN
INTRAMUSCULAR | Status: AC | PRN
Start: 2023-04-23 — End: 2023-04-23
  Administered 2023-04-23 (×3): 1 mg via INTRAVENOUS

## 2023-04-23 MED ORDER — LIDOCAINE-EPINEPHRINE 1 %-1:100000 IJ SOLN
INTRAMUSCULAR | Status: AC
  Filled 2023-04-23: qty 1

## 2023-04-23 MED ORDER — IOHEXOL 300 MG/ML  SOLN
50.0000 mL | Freq: Once | INTRAMUSCULAR | Status: AC | PRN
  Administered 2023-04-23: 15 mL

## 2023-04-23 MED ORDER — LIDOCAINE-EPINEPHRINE 1 %-1:100000 IJ SOLN
20.0000 mL | Freq: Once | INTRAMUSCULAR | Status: AC
  Administered 2023-04-23: 15 mL via INTRADERMAL

## 2023-04-23 MED ORDER — FENTANYL CITRATE (PF) 100 MCG/2ML IJ SOLN
INTRAMUSCULAR | Status: AC
  Filled 2023-04-23: qty 2

## 2023-04-23 MED ORDER — NUTRISOURCE FIBER PO PACK
1.0000 | PACK | Freq: Two times a day (BID) | ORAL | Status: DC
  Administered 2023-04-24 – 2023-04-25 (×3): 1
  Filled 2023-04-23 (×4): qty 1

## 2023-04-23 MED ORDER — HEPARIN SODIUM (PORCINE) 1000 UNIT/ML IJ SOLN
INTRAMUSCULAR | Status: AC
  Administered 2023-04-23: 1000 [IU]
  Filled 2023-04-23: qty 4

## 2023-04-23 MED ORDER — DOCUSATE SODIUM 50 MG/5ML PO LIQD: 100.0000 mg | Freq: Two times a day (BID) | ORAL | Status: DC | PRN

## 2023-04-23 MED ORDER — POLYETHYLENE GLYCOL 3350 17 G PO PACK: 17.0000 g | PACK | Freq: Every day | ORAL | Status: DC | PRN

## 2023-04-23 MED ORDER — CEFAZOLIN SODIUM-DEXTROSE 2-4 GM/100ML-% IV SOLN
INTRAVENOUS | Status: AC
  Filled 2023-04-23: qty 100

## 2023-04-23 MED ORDER — SACCHAROMYCES BOULARDII 250 MG PO CAPS
250.0000 mg | ORAL_CAPSULE | Freq: Two times a day (BID) | ORAL | Status: DC
  Administered 2023-04-24 – 2023-04-28 (×9): 250 mg
  Filled 2023-04-23 (×10): qty 1

## 2023-04-23 MED ORDER — DEXTROSE 50 % IV SOLN
1.0000 | Freq: Once | INTRAVENOUS | Status: AC
  Administered 2023-04-23: 50 mL via INTRAVENOUS
  Filled 2023-04-23: qty 50

## 2023-04-23 MED ORDER — MIDAZOLAM HCL 2 MG/2ML IJ SOLN
INTRAMUSCULAR | Status: AC
  Filled 2023-04-23: qty 2

## 2023-04-23 MED ORDER — SODIUM ZIRCONIUM CYCLOSILICATE 10 G PO PACK
10.0000 g | PACK | Freq: Once | ORAL | Status: AC
  Administered 2023-04-23: 10 g via ORAL
  Filled 2023-04-23: qty 1

## 2023-04-23 MED ORDER — CEFAZOLIN SODIUM-DEXTROSE 2-4 GM/100ML-% IV SOLN
INTRAVENOUS | Status: AC | PRN
  Administered 2023-04-23: 2 g via INTRAVENOUS

## 2023-04-23 MED ORDER — INSULIN ASPART 100 UNIT/ML IV SOLN
10.0000 [IU] | Freq: Once | INTRAVENOUS | Status: AC
  Administered 2023-04-23: 10 [IU] via INTRAVENOUS

## 2023-04-23 MED ORDER — HEPARIN SODIUM (PORCINE) 1000 UNIT/ML IJ SOLN
10.0000 mL | Freq: Once | INTRAMUSCULAR | Status: AC
  Administered 2023-04-23: 3.2 mL via INTRAVENOUS
  Filled 2023-04-23: qty 10

## 2023-04-23 MED ORDER — PIVOT 1.5 CAL PO LIQD
1000.0000 mL | ORAL | Status: DC
  Administered 2023-04-23 – 2023-04-27 (×5): 1000 mL
  Filled 2023-04-23 (×6): qty 1000

## 2023-04-23 NOTE — Progress Notes (Signed)
Nutrition Follow-up  DOCUMENTATION CODES:  Not applicable  INTERVENTION:  Once PEG is cleared for use: Pivot 1.5 at 50 ml/h (1200 ml per day) Provides 1800 kcal, 112 gm protein, 900 ml free water daily If pt's electrolytes remain altered despite placement of permanent HD line and HD, consider adjusting to lower K formula now that pt is not requiring pressor support and is stable for discharge to [facility] at 94mL/h ( ) This provides 2124 kcal, 97g of protein, of water Renal MVI daily Nutrasource fiber BID to aid in management of loose stools Probiotics BID to promote gut health  NUTRITION DIAGNOSIS:  Inadequate oral intake related to inability to eat as evidenced by NPO status. - remains applicable   GOAL:  Patient will meet greater than or equal to 90% of their needs - progressing, being met with TF at goal  MONITOR:  Vent status, Labs, Weight trends  REASON FOR ASSESSMENT:  Consult Other (Comment) (advance cortrak 6-7 cm)  ASSESSMENT:  Pt with hx COPD, chronic constipation, colectomy (in the 1990's) due to diverticulitis, fibromyalgia, HLD, and depression who initially presented to [facility] for 1 week hx of URI symptoms admitted for respiratory failure due to RLL PNA and septic shock.  7/5: admitted [facility] 7/7: extubated 7/10: reintubated  7/16: tx to [facility], remains intubated  7/18: OGT replaced with small bore NG feeding tube 7/19: IR attempted NGT advancement to post-pyloric but unsuccessful tube left in stomach; Trickle feeds started; starting on CRRT 7/22: TF advanced to 54mL/hr 7/23 - trach placed, CRRT stopped 7/25 - transferred to [facility] for iHD. Vomiting on arrival, NGT placed for suction 7/28 - code stroke called, negative workup 8/1 - small bore tube clogged, removed 8/2 - cortrak placed, distal stomach 8/7 - PEG and tunneled HD catheter scheduled  Pt currently NPO and planned to have PEG and tunneled HD line placed in IR today. Pt not able to  work with SLP on PMSV yet. Noted that K has been trending up over the last few days, was previously controlled with HD. HD RN reported issues during last session on 8/5, likely not getting efficient HD. Will monitor labs once permanent placement is being used and determine if a change in formula is warranted.   Pt with ongoing issues with loose stools. Will add fiber and probiotics to aid in gut health. Also noted that bowel regimen is still scheduled and being given at times. May need to be adjusted to prn to avoid  Once PEG and permanent HD catheter placed, pt is stable to be discharged to [facility].  Last HD 8/5 Net UF 1.9L  MV: 6.9 L/min Temp (24hrs), Avg:99.2 F (37.3 C), Min:98.7 F (37.1 C), Max:100.2 F (37.9 C)   Intake/Output Summary (Last 24 hours) at 04/23/2023 0743 Last data filed at 04/23/2023 0600 Gross per 24 hour  Intake 1540 ml  Output 175 ml  Net 1365 ml  Net IO Since Admission: -8,923.21 mL [04/23/23 0743]  Nutritionally Relevant Medications: Scheduled Meds:  atorvastatin  20 mg Daily   docusate  100 mg BID   insulin aspart  0-9 Units Q4H   multivitamin  1 tablet QHS   pantoprazole IV  40 mg Q24H   polyethylene glycol  17 g Daily   sodium zirconium cyclosilicate  10 g BID   PRN Meds: bisacodyl, ondansetron  Labs Reviewed: Na 133, chloride 95 K 6 BUN 128, creatinine 5.0 CBG ranges from 66-154 mg/dL over the last 24 hours  Diet Order:  Diet Order             Diet NPO time specified  Diet effective now                  EDUCATION NEEDS:  Not appropriate for education at this time  Skin: Skin Assessment: Reviewed RN Assessment Skin Integrity Issues:: Stage I Stage I: Left buttocks  Last BM:  7/31 - type 7 FMS placed 7/28 output in the last 24h  Height:  Ht Readings from Last 1 Encounters:  04/01/23 5\' 5"  (1.651 m)   Weight:  Wt Readings from Last 1 Encounters:  04/23/23 72.3 kg   Ideal Body Weight:  56.82 kg  BMI:  Body mass  index is 26.52 kg/m.  Estimated Nutritional Needs:  Kcal:  1800-2000 kcals Protein:  95-115 grams Fluid:  >/= 1.8L   Greig Castilla, RD, LDN Clinical Dietitian RD pager # available in [facility]  After hours/weekend pager # available in [facility]

## 2023-04-23 NOTE — Progress Notes (Signed)
Patient transported to IR and back without complications. RN at bedside. ?

## 2023-04-23 NOTE — Progress Notes (Signed)
   04/23/23 1210  Vitals  BP (!) 162/84  BP Location Right Arm  BP Method Automatic  Patient Position (if appropriate) Lying  During Treatment Monitoring  Intra-Hemodialysis Comments See progress note  Post Treatment  Dialyzer Clearance Clotted  Duration of HD Treatment -hour(s) 3 hour(s)  Hemodialysis Intake (mL) 0 mL  Liters Processed 60  Fluid Removed (mL) 2500 mL  Tolerated HD Treatment Yes  Post-Hemodialysis Comments Pt cut off 30 mins d/t system clotted. Pt goal met.  Hemodialysis Catheter Right Internal jugular Triple lumen Temporary (Non-Tunneled)  Placement Date/Time: 04/04/23 1750   Placed prior to admission: No  Time Out: Correct patient;Correct site;Correct procedure  Maximum sterile barrier precautions: Hand hygiene;Cap;Mask;Sterile gown;Sterile gloves;Large sterile sheet  Site Prep: Chlorh...  Site Condition No complications  Blue Lumen Status Heparin locked  Red Lumen Status Heparin locked  Catheter fill solution Heparin 1000 units/ml  Catheter fill volume (Arterial) 1.2 cc  Catheter fill volume (Venous) 1.2  Dressing Type Transparent  Dressing Status Antimicrobial disc in place;Clean, Dry, Intact  Post treatment catheter status Capped and Clamped

## 2023-04-23 NOTE — Plan of Care (Signed)

## 2023-04-23 NOTE — Procedures (Signed)
Interventional Radiology Procedure Note  Procedure:  1) Image guided placement of tunneled hemodialysis catehter 2) Placement of percutaneous 14F balloon-retention gastrostomy tube.  Complications: None  EBL:  < 5 mL  Recommendations: - NPO for 4 hours after placement - Maintain G-tube to low wall suction for 4 hours after placement - May advance diet as tolerated and begin using tube 4 hours after placement - Gastropexy sutures are absorbable and do not require removal - Tunneled HD catheter ready for immediate use  Marliss Coots, MD Pager: [phone]

## 2023-04-23 NOTE — Progress Notes (Addendum)
NAME:  Tamara Castro, MRN:  [id number], DOB:  13-Jun-1961, LOS: 22 ADMISSION DATE:  04/01/2023  History of Present Illness:  62 year old with COPD, chronic constipation, colectomy in 1990s due to diverticulitis, fibromyalgia on disability since 2008, depression and anxiety, neuropathy.  Presented to [facility] on 03/21/2023 after a week of URI symptoms and was admitted for acute hypoxic and hypercapnic respiratory failure due to right lower lobe pneumonia.  She required intubation and pressor support for septic shock.  She had an AKI and acute heart failure with preserved LVEF and reduced RV EF.  PE ruled out by CT at that time.  She initially improved with antibiotics for CAP coverage and was extubated but then reintubated after clinically worsening.  Hospital course has been complicated by acute on chronic ileus, progression to renal failure, now on HD, prolonged mechanical ventilation requiring tracheostomy.  Significant Hospital Events:  7/5 admitted [facility] CTX and azith started 7/7 extubated. Vanc added and cefepime started; CTX stopped 7/8 vanc stopped 7/10 reintubated  7/16 tx to [facility]. Cefepime stopped 7/17 attempting lasix. Creatinine worse.  Echo EF 60-65%no WMA RV nml  7/18 cr worse after lasix. Weaning but on-going ileus and cr rising presenting obstacle to extubation Trying neostigmine.  7/19 scr worse in spite of holding lasix. Still on low dose precedex. Mental status and renal fxn still barrier to extubation. Nephro consulted > started CRRT.  HD catheter placed. 7/20 Govan heparin stopped d/t bleeding around HD cath. Got DDAVP.  7/21 resumes cefepime for worsening leukocytosis.  7/22 volumes ok w/ SBT but has sig accessory use  7/23- Trach performed at bedside. CRRT holiday per nephrology. Increased Xanax to home dose 1mg  QID- added seroquel 25mg  BID 7/24- has successfully weaned off propofol and converted to precedex infusion. Tolerating 4+ hours of SBT 7/25- discontinued  precedex and fentanyl with as needed fentanyl pushes for pain. Tolerated SBT for 3 hours before failing and being placed on full vent support. Oliguric with rising serum creatinine- nephrology requests transfer to [facility] for restarting dialysis  7/26 multiple emesis, NG to LIS, EEG neg , HD session 7/27 DC Seroquel, decrease Xanax 0.5 twice daily 7/28 Code stroke called for unequal pupils this a.m., head CT negative, seen by neurology; MRI neg 7/31 Hgb drop to 5.5, 2 u prbc, DIC panel Lower extremity Dopplers 8/3 >>  CT abdomen 8/3 >> near complete resolution of pleural effusions, there is a safe window for PEG tube placement  Interim History / Subjective:  Potassium elevated to 6, insulin with dextrose started, Kayexalate Afebrile PRVC 14/450/5/40%  Objective   Blood pressure 132/71, pulse 89, temperature 97.7 F (36.5 C), temperature source Oral, resp. rate 18, height 5\' 5"  (1.651 m), weight 72.3 kg, SpO2 97%.    Vent Mode: PSV;CPAP FiO2 (%):  [40 %] 40 % Set Rate:  [14 bmp] 14 bmp Vt Set:  [450 mL] 450 mL PEEP:  [5 cmH20] 5 cmH20 Pressure Support:  [5 cmH20-10 cmH20] 5 cmH20 Plateau Pressure:  [14 cmH20-21 cmH20] 14 cmH20   Intake/Output Summary (Last 24 hours) at 04/23/2023 0935 Last data filed at 04/23/2023 0600 Gross per 24 hour  Intake 1500 ml  Output 175 ml  Net 1325 ml   Filed Weights   04/22/23 0500 04/23/23 0500 04/23/23 0906  Weight: 72.6 kg 72.3 kg 72.3 kg    Examination: No apparent distress Tracheostomy tube connected to vent Breathing comfortably on pressure support with minimal settings, breath sounds clear Heart rate and rhythm  normal, strong radial pulses, some peripheral edema Abdomen is nontender Skin is warm and dry, right IJ HD cath is nontender and clean appearing  Labs: Sodium 133 Potassium 6 Bicarb 23 BUN/creatinine 128/5.01 WBC 11.9 Hemoglobin 7.6 Platelets 195 PT/INR 13.5/1  Resolved Hospital Problem list   Resolved Problems:   CAP  (community acquired pneumonia)   Septic shock (HCC)   COPD with acute exacerbation (HCC)   Acute heart failure with preserved ejection fraction (HCC)   Elevated troponin   Metabolic acidosis   Assessment & Plan:  Principal Problem:   Acute hypoxemic respiratory failure (HCC) Active Problems:   Aspiration pneumonia (HCC)   Pressure injury of skin   Ileus (HCC)   Volume overload   AKI (acute kidney injury) (HCC)   COPD (chronic obstructive pulmonary disease) (HCC)   Tobacco abuse   (HFpEF) heart failure with preserved ejection fraction (HCC)   Leukocytosis   CAD (coronary artery disease)   Anemia   Thrombocytopenia (HCC)   Acute encephalopathy   Depression   Anxiety   Tracheostomy status (HCC)  Prolonged mechanical ventilation requiring tracheostomy Community-acquired pneumonia, resolved History of COPD - Pulmonary hygiene - Push SBT, PSV - ATC as tolerated - Nebulized LAMA/LABA/ICS  Anemia of critical illness - Persistent hemoglobin drifted downward despite no obvious sources of bleeding or hemolysis - Low suspicion for DIC - Hemoglobin stable since transfusion 1 unit PRBC yesterday - She is iron replete  History of anxiety - Gabapentin 200 mg nightly - Clonazepam 0.5 mg twice daily  Acute tubular necrosis, now on intermittent HD - Appreciate nephrology management - Tunneled HD cath with IR planned for today  Hyperkalemia - HD today  Ileus, resolved - Continue tube feeds - Gastrostomy tube by IR planned for today - Fecal management system in place  Nutrition Status: Nutrition Problem: Inadequate oral intake Etiology: inability to eat Signs/Symptoms: NPO status Interventions: Refer to RD note for recommendations  In vitro hemolysis - Drawn necessary labs from peripheral sites  Best practice (daily eval):  Diet/type: tubefeeds DVT prophylaxis: LMWH, held for IR procedure today GI prophylaxis: PPI Lines: Dialysis Catheter, tunneled HD cath by IR  today Foley:  N/A Code Status:  DNR  Marrianne Mood MD 04/23/2023, 9:35 AM  Pager: [phone]

## 2023-04-23 NOTE — TOC Progression Note (Signed)
Transition of Care [facility]) - Progression Note    Patient Details  Name: Tamara Castro MRN: [id number] Date of Birth: 16-Mar-1961  Transition of [facility]) CM/SW Contact  Harriet Masson, RN Phone Number: 04/23/2023, 11:21 AM  Clinical Narrative:     Patient scheduled for  Dialysis Catheter, tunneled HD cath by IR  and PEG tube today.  TOC following.   Expected Discharge Plan: Long Term Acute Care (LTAC) (TBD) Barriers to Discharge: Continued Medical Work up  Expected Discharge Plan and Services In-house Referral: Clinical Social Work     Living arrangements for the past 2 months: Single Family Home                                       Social Determinants of Health (SDOH) Interventions SDOH Screenings   Food Insecurity: No Food Insecurity (04/04/2023)  Housing: Low Risk  (04/04/2023)  Transportation Needs: No Transportation Needs (04/04/2023)  Utilities: Not At Risk (04/04/2023)  Alcohol Screen: Low Risk  (04/09/2021)  Depression (PHQ2-9): Low Risk  (04/09/2021)  Financial Resource Strain: Low Risk  (04/09/2021)  Physical Activity: Insufficiently Active (04/09/2021)  Social Connections: Socially Isolated (04/09/2021)  Stress: No Stress Concern Present (04/09/2021)  Tobacco Use: High Risk (04/04/2023)    Readmission Risk Interventions     No data to display

## 2023-04-23 NOTE — Plan of Care (Signed)
  Problem: Education: Goal: Knowledge of General Education information will improve Description: Including pain rating scale, medication(s)/side effects and non-pharmacologic comfort measures Outcome: Progressing   Problem: Health Behavior/Discharge Planning: Goal: Ability to manage health-related needs will improve Outcome: Progressing   Problem: Clinical Measurements: Goal: Ability to maintain clinical measurements within normal limits will improve Outcome: Progressing Goal: Will remain free from infection Outcome: Progressing Goal: Diagnostic test results will improve Outcome: Progressing Goal: Respiratory complications will improve Outcome: Progressing Goal: Cardiovascular complication will be avoided Outcome: Progressing   Problem: Activity: Goal: Risk for activity intolerance will decrease Outcome: Progressing   Problem: Nutrition: Goal: Adequate nutrition will be maintained Outcome: Progressing   Problem: Coping: Goal: Level of anxiety will decrease Outcome: Progressing   Problem: Elimination: Goal: Will not experience complications related to bowel motility Outcome: Progressing Goal: Will not experience complications related to urinary retention Outcome: Progressing   Problem: Pain Managment: Goal: General experience of comfort will improve Outcome: Progressing   Problem: Safety: Goal: Ability to remain free from injury will improve Outcome: Progressing   Problem: Skin Integrity: Goal: Risk for impaired skin integrity will decrease Outcome: Progressing   Problem: Activity: Goal: Ability to tolerate increased activity will improve Outcome: Progressing   Problem: Respiratory: Goal: Ability to maintain a clear airway and adequate ventilation will improve Outcome: Progressing   Problem: Role Relationship: Goal: Method of communication will improve Outcome: Progressing   Problem: Education: Goal: Knowledge about tracheostomy care/management will  improve Outcome: Progressing   Problem: Activity: Goal: Ability to tolerate increased activity will improve Outcome: Progressing   Problem: Health Behavior/Discharge Planning: Goal: Ability to manage tracheostomy will improve Outcome: Progressing   Problem: Respiratory: Goal: Patent airway maintenance will improve Outcome: Progressing   Problem: Role Relationship: Goal: Ability to communicate will improve Outcome: Progressing   Problem: Education: Goal: Ability to describe self-care measures that may prevent or decrease complications (Diabetes Survival Skills Education) will improve Outcome: Progressing Goal: Individualized Educational Video(s) Outcome: Progressing   Problem: Coping: Goal: Ability to adjust to condition or change in health will improve Outcome: Progressing   Problem: Fluid Volume: Goal: Ability to maintain a balanced intake and output will improve Outcome: Progressing   Problem: Health Behavior/Discharge Planning: Goal: Ability to identify and utilize available resources and services will improve Outcome: Progressing Goal: Ability to manage health-related needs will improve Outcome: Progressing   Problem: Metabolic: Goal: Ability to maintain appropriate glucose levels will improve Outcome: Progressing   Problem: Nutritional: Goal: Maintenance of adequate nutrition will improve Outcome: Progressing Goal: Progress toward achieving an optimal weight will improve Outcome: Progressing   Problem: Skin Integrity: Goal: Risk for impaired skin integrity will decrease Outcome: Progressing   Problem: Tissue Perfusion: Goal: Adequacy of tissue perfusion will improve Outcome: Progressing   

## 2023-04-23 NOTE — Progress Notes (Signed)
eLink Physician-Brief Progress Note Patient Name: Tamara Castro DOB: 1961/07/14 MRN: [id number]   Date of Service  04/23/2023  HPI/Events of Note  AKI on CKD, HD patient, potassium 6  eICU Interventions  Insulin/dextrose, Kayexalate, anticipate HD today.     Intervention Category Minor Interventions: Electrolytes abnormality - evaluation and management    04/23/2023, 5:53 AM

## 2023-04-23 NOTE — Progress Notes (Signed)
[facility] Progress Note   Assessment/ Plan:   Acute kidney injury - b/l creat 1.0 from jan 2023. Creat here was 1.6 on admission in setting of pneumonia, hypotension and septic shock. Creat peaked at 1.7 and then returned to 1.0 on 7/12. Renal failure then worsened again up to 3.84 and CRRT started 7/19. AKI felt to be related to hypotension and IV contrast +/- diuretics. Has a two day CRRT holiday which showed min UOP.   - on MWF IHD  next today ideally after new access -  not sure if K is going to be an issue for the procedure-  if it is we may need to do HD first   - converting nontunneled to tunneled HD cath when possible-  slated for today   - OK for heparin- no evidence of HIT, in vitro hemolysis Sepsis/ shock/ hypotension - initial dx on admission was pna and septic shock, now resolved.  Acute hypoxic respiratory failure - due to COPD/ pna, on vent w/ trach per CCM. F/u CXR 7/23 was negative for edema. Attempting to wean vent Volume excess/ lower extremity edema-  much improved-  soft BP limits UF Anemia - Hb 7- 9 range, tranfuse prn-   blood given 8/6. Have added ESA  Anxiety/ depression Dispo-  possibly planning for LTAC Hyperkalemia-  has been an issue -  came down with HD but was still above 5 yest and getting blood -  dosed with some lokelma yest and this AM as trying to wait til new access to get HD tomorrow   Subjective:    Seen in room.  No major events overnight but K did rise in spite of lokelma 10 BID  yesterday -  slated for PEG and TDC today and also dialysis    Objective:   BP 126/71   Pulse 87   Temp 98.8 F (37.1 C) (Axillary)   Resp 17   Ht 5\' 5"  (1.651 m)   Wt 72.6 kg   SpO2 97%   BMI 26.63 kg/m   Intake/Output Summary (Last 24 hours) at 04/23/2023 1324 Last data filed at 04/23/2023 0200 Gross per 24 hour  Intake 1540 ml  Output 50 ml  Net 1490 ml   Weight change:   Physical Exam: Gen:ill-appearing but awake-   understands what I am  saying to her CVS: RRR Resp: + trach, coarse mech bilaterally Abd: + soft Ext: min edema, UE greater than LE, improving ACCESS: R internal jugular nontunneled HD Cath  Imaging: DG CHEST PORT 1 VIEW  Result Date: 04/21/2023 CLINICAL DATA:  Respiratory failure. EXAM: PORTABLE CHEST 1 VIEW COMPARISON:  04/15/2023 FINDINGS: Tracheostomy tube in satisfactory position. Feeding tube with the tip coursing below the diaphragm excluded from the field of view. Lungs are hyperinflated as can be seen with COPD. Prominent stable interstitial markings at the lung bases bilaterally. No new focal parenchymal opacity to suggest pneumonia. No pleural effusion or pneumothorax. Heart and mediastinal contours are unremarkable. No acute osseous abnormality. IMPRESSION: 1. COPD. 2. No acute cardiopulmonary disease. Electronically Signed   By: Elige Ko M.D.   On: 04/21/2023 12:18    Labs: BMET Recent Labs  Lab 04/17/23 0530 04/18/23 1232 04/19/23 0832 04/20/23 0022 04/21/23 0131 04/22/23 0056 04/23/23 0354  NA SPECIMEN HEMOLYZED. HEMOLYSIS MAY AFFECT INTEGRITY OF RESULTS. 137 136 135 135 133* 133*  K SPECIMEN HEMOLYZED. HEMOLYSIS MAY AFFECT INTEGRITY OF RESULTS. 4.3 4.4 4.5 5.7* 5.2* 6.0*  CL SPECIMEN HEMOLYZED. HEMOLYSIS MAY AFFECT  INTEGRITY OF RESULTS. 98 91* 94* 94* 93* 95*  CO2 SPECIMEN HEMOLYZED. HEMOLYSIS MAY AFFECT INTEGRITY OF RESULTS. 23 28 27 26 26 23   GLUCOSE SPECIMEN HEMOLYZED. HEMOLYSIS MAY AFFECT INTEGRITY OF RESULTS. 133* 160* 148* 139* 141* 110*  BUN SPECIMEN HEMOLYZED. HEMOLYSIS MAY AFFECT INTEGRITY OF RESULTS. 110* 58* 87* 137* 78* 128*  CREATININE SPECIMEN HEMOLYZED. HEMOLYSIS MAY AFFECT INTEGRITY OF RESULTS. 6.24* 3.38* 4.38* 5.96* 3.62* 5.01*  CALCIUM SPECIMEN HEMOLYZED. HEMOLYSIS MAY AFFECT INTEGRITY OF RESULTS. 9.6 9.1 8.7* 8.5* 8.1* 8.5*   CBC Recent Labs  Lab 04/21/23 0131 04/22/23 0056 04/22/23 1208 04/23/23 0354  WBC 12.4* 13.2* 14.0* 11.9*  HGB 7.6* 6.3* 7.9* 7.6*  HCT  24.5* 20.9* 25.5* 23.9*  MCV 93.2 94.1 93.4 94.1  PLT 227 193 209 195    Medications:     arformoterol  15 mcg Nebulization BID   atorvastatin  20 mg Per Tube Daily   budesonide (PULMICORT) nebulizer solution  0.5 mg Nebulization BID   Chlorhexidine Gluconate Cloth  6 each Topical Q0600   Chlorhexidine Gluconate Cloth  6 each Topical Q0600   clonazePAM  0.5 mg Per Tube BID   darbepoetin (ARANESP) injection - DIALYSIS  150 mcg Subcutaneous Q Mon-1800   insulin aspart  10 Units Intravenous Once   And   dextrose  1 ampule Intravenous Once   docusate  100 mg Per Tube BID   escitalopram  10 mg Per Tube Daily   gabapentin  200 mg Per Tube QHS   Gerhardt's butt cream   Topical BID   heparin injection (subcutaneous)  5,000 Units Subcutaneous Q8H   insulin aspart  0-9 Units Subcutaneous Q4H   multivitamin  1 tablet Per Tube QHS   mouth rinse  15 mL Mouth Rinse Q2H   pantoprazole (PROTONIX) IV  40 mg Intravenous Q24H   polyethylene glycol  17 g Per NG tube Daily   revefenacin  175 mcg Nebulization Daily   sodium chloride flush  10-40 mL Intracatheter Q12H   sodium chloride flush  3 mL Intravenous Q12H   sodium zirconium cyclosilicate  10 g Per Tube BID   sodium zirconium cyclosilicate  10 g Oral Once   [employer]  04/23/2023, 6:05 AM

## 2023-04-24 DIAGNOSIS — J9601 Acute respiratory failure with hypoxia: Secondary | ICD-10-CM | POA: Diagnosis not present

## 2023-04-24 LAB — PHOSPHORUS: Phosphorus: 5.1 mg/dL — ABNORMAL HIGH (ref 2.5–4.6)

## 2023-04-24 LAB — GLUCOSE, CAPILLARY
Glucose-Capillary: 103 mg/dL — ABNORMAL HIGH (ref 70–99)
Glucose-Capillary: 131 mg/dL — ABNORMAL HIGH (ref 70–99)
Glucose-Capillary: 131 mg/dL — ABNORMAL HIGH (ref 70–99)
Glucose-Capillary: 132 mg/dL — ABNORMAL HIGH (ref 70–99)
Glucose-Capillary: 140 mg/dL — ABNORMAL HIGH (ref 70–99)
Glucose-Capillary: 150 mg/dL — ABNORMAL HIGH (ref 70–99)

## 2023-04-24 LAB — MAGNESIUM: Magnesium: 1.7 mg/dL (ref 1.7–2.4)

## 2023-04-24 MED ORDER — MAGNESIUM SULFATE 2 GM/50ML IV SOLN
2.0000 g | Freq: Once | INTRAVENOUS | Status: AC
  Administered 2023-04-24: 2 g via INTRAVENOUS
  Filled 2023-04-24: qty 50

## 2023-04-24 MED ORDER — CHLORHEXIDINE GLUCONATE CLOTH 2 % EX PADS
6.0000 | MEDICATED_PAD | Freq: Every day | CUTANEOUS | Status: DC
  Administered 2023-04-26 – 2023-04-27 (×2): 6 via TOPICAL

## 2023-04-24 NOTE — TOC Progression Note (Signed)
Transition of Care [facility]) - Progression Note    Patient Details  Name: Tamara Castro MRN: [id number] Date of Birth: [birth year]  [facility]) CM/SW Contact  Harriet Masson, RN Phone Number: 04/24/2023, 12:43 PM  Clinical Narrative:    [employer] is doing expedited appeal.    Expected Discharge Plan: Long Term Acute Care (LTAC) (TBD) Barriers to Discharge: Continued Medical Work up  Expected Discharge Plan and Services In-house Referral: Clinical Social Work     Living arrangements for the past 2 months: Single Family Home                                       Social Determinants of Health (SDOH) Interventions SDOH Screenings   Food Insecurity: No Food Insecurity (04/04/2023)  Housing: Low Risk  (04/04/2023)  Transportation Needs: No Transportation Needs (04/04/2023)  Utilities: Not At Risk (04/04/2023)  Alcohol Screen: Low Risk  (04/09/2021)  Depression (PHQ2-9): Low Risk  (04/09/2021)  Financial Resource Strain: Low Risk  (04/09/2021)  Physical Activity: Insufficiently Active (04/09/2021)  Social Connections: Socially Isolated (04/09/2021)  Stress: No Stress Concern Present (04/09/2021)  Tobacco Use: High Risk (04/04/2023)    Readmission Risk Interventions     No data to display

## 2023-04-24 NOTE — Hospital Course (Signed)
62 year old female with COPD initially presented to [facility] ED on 03/21/2023 after a week of upper respiratory symptoms.  She was in acute hypoxic and hypercapnic respiratory failure, found to have sepsis due to right lower lobe pneumonia.  She required intubation.  Lower respiratory cultures grew Moraxella.  She was treated with several days of cefepime, vancomycin, and doxycycline.  She was also treated for concomitant COPD exacerbation.  Her hospital course was complicated by type II MI with right sided congestive heart failure, AKI with progression to acute renal failure requiring renal replacement therapy, ileus, and anemia.  She was eventually transferred to the MICU at [facility].  Her condition did eventually stabilize, but she is now tracheostomy and gastrostomy tube dependent and will require a period of time at a specialty hospital for long-term acute care.  Sepsis and acute hypoxic respiratory failure Community-acquired pneumonia COPD Prolonged mechanical ventilation requiring tracheostomy - Pulmonary hygiene - Tolerating extended periods of time off of vent during the day - PSV at nighttime for rest - Nebulized arformoterol, revefenacin, budesonide twice daily  Acute tubular necrosis Anuric renal failure - Getting intermittent HD on MWF while hospitalized  Ileus, resolved - Percutaneous gastrostomy tube placed for enteral feeding  Anemia of critical illness - Hemoglobin drift from frequent phlebotomy in setting of bone marrow suppression due to critical illness - She is iron replete at time of discharge

## 2023-04-24 NOTE — Plan of Care (Signed)
  Problem: Education: Goal: Knowledge of General Education information will improve Description: Including pain rating scale, medication(s)/side effects and non-pharmacologic comfort measures Outcome: Progressing   Problem: Health Behavior/Discharge Planning: Goal: Ability to manage health-related needs will improve Outcome: Progressing   Problem: Clinical Measurements: Goal: Ability to maintain clinical measurements within normal limits will improve Outcome: Progressing Goal: Will remain free from infection Outcome: Progressing Goal: Diagnostic test results will improve Outcome: Progressing Goal: Respiratory complications will improve Outcome: Progressing Goal: Cardiovascular complication will be avoided Outcome: Progressing   Problem: Activity: Goal: Risk for activity intolerance will decrease Outcome: Progressing   Problem: Nutrition: Goal: Adequate nutrition will be maintained Outcome: Progressing   Problem: Coping: Goal: Level of anxiety will decrease Outcome: Progressing   Problem: Elimination: Goal: Will not experience complications related to bowel motility Outcome: Progressing Goal: Will not experience complications related to urinary retention Outcome: Progressing   Problem: Pain Managment: Goal: General experience of comfort will improve Outcome: Progressing   Problem: Safety: Goal: Ability to remain free from injury will improve Outcome: Progressing   Problem: Skin Integrity: Goal: Risk for impaired skin integrity will decrease Outcome: Progressing   Problem: Activity: Goal: Ability to tolerate increased activity will improve Outcome: Progressing   Problem: Respiratory: Goal: Ability to maintain a clear airway and adequate ventilation will improve Outcome: Progressing   Problem: Role Relationship: Goal: Method of communication will improve Outcome: Progressing   Problem: Education: Goal: Knowledge about tracheostomy care/management will  improve Outcome: Progressing   Problem: Activity: Goal: Ability to tolerate increased activity will improve Outcome: Progressing   Problem: Health Behavior/Discharge Planning: Goal: Ability to manage tracheostomy will improve Outcome: Progressing   Problem: Respiratory: Goal: Patent airway maintenance will improve Outcome: Progressing   Problem: Role Relationship: Goal: Ability to communicate will improve Outcome: Progressing   Problem: Education: Goal: Ability to describe self-care measures that may prevent or decrease complications (Diabetes Survival Skills Education) will improve Outcome: Progressing Goal: Individualized Educational Video(s) Outcome: Progressing   Problem: Coping: Goal: Ability to adjust to condition or change in health will improve Outcome: Progressing   Problem: Fluid Volume: Goal: Ability to maintain a balanced intake and output will improve Outcome: Progressing   Problem: Health Behavior/Discharge Planning: Goal: Ability to identify and utilize available resources and services will improve Outcome: Progressing Goal: Ability to manage health-related needs will improve Outcome: Progressing   Problem: Metabolic: Goal: Ability to maintain appropriate glucose levels will improve Outcome: Progressing   Problem: Nutritional: Goal: Maintenance of adequate nutrition will improve Outcome: Progressing Goal: Progress toward achieving an optimal weight will improve Outcome: Progressing   Problem: Skin Integrity: Goal: Risk for impaired skin integrity will decrease Outcome: Progressing   Problem: Tissue Perfusion: Goal: Adequacy of tissue perfusion will improve Outcome: Progressing   Problem: Safety: Goal: Non-violent Restraint(s) Outcome: Progressing

## 2023-04-24 NOTE — Progress Notes (Signed)
eLink Physician-Brief Progress Note Patient Name: Tamara Castro DOB: Feb 14, 1961 MRN: [id number]   Date of Service  04/24/2023  HPI/Events of Note  AKI on CKD, HD patient, got a a tunneled line in the Right IJ  eICU Interventions  DC trialysis right internal jugular line, not on any centrally acting meds or pressors.     Intervention Category Minor Interventions: Routine modifications to care plan (e.g. PRN medications for pain, fever)    04/24/2023, 2:27 AM

## 2023-04-24 NOTE — Progress Notes (Signed)
Orthopedic Tech Progress Note Patient Details:  CHANDRIKA LIVINGSTON 1961/07/09 [id number]  Prafos applied to BLE in place of the Prevalon boots.  Ortho Devices Type of Ortho Device: Prafo boot/shoe Ortho Device/Splint Location: BLE Ortho Device/Splint Interventions: Ordered, Application, Adjustment   Post Interventions Patient Tolerated: Well Instructions Provided: Care of device   Carmine Savoy 04/24/2023, 3:51 PM

## 2023-04-24 NOTE — Progress Notes (Signed)
Physical Therapy Treatment Patient Details Name: Tamara Castro MRN: [id number] DOB: 1961/06/09 Today's Date: 04/24/2023   History of Present Illness 62 yo female admitted to [facility] 7/5 with RLL PNA, sepsis and acute HF. Intubated (7/5-7/7, bipap 7/7-7/9, 7/10-present). 7/16 transfer to [facility]. 7/19-7/23 CRRT. 7/23 trach. 7/25 transfer to [facility] for HD. 7/26 EEG with encephalopathy. 8/7 TDC placed along with g-tube. PMhx: COPD, tobacco abuse, ileus, anxiety, CAD    PT Comments  Pt pleasant, on trach collar and able to transition into sitting and standing with +2 assist. Pt with slowly improving strength and communication mouthing words.  Pt with bil ankle plantarflexion in need of PRAFOs. Pt encouraged to continue HEP throughout day and chair positioning. Will continue to follow with LTACH recommended.   HR 104-111 SPO2 92% on 35% trach collar   If plan is discharge home, recommend the following: Two people to help with walking and/or transfers;Two people to help with bathing/dressing/bathroom;Direct supervision/assist for medications management;Assistance with feeding;Assistance with cooking/housework;Direct supervision/assist for financial management;Assist for transportation;Help with stairs or ramp for entrance   Can travel by private vehicle     No  Equipment Recommendations  Wheelchair cushion (measurements PT);Wheelchair (measurements PT);Hospital bed;Other (comment)    Recommendations for Other Services       Precautions / Restrictions Precautions Precautions: Other (comment);Fall Precaution Comments: trach, flexiseal, g tube     Mobility  Bed Mobility Overal bed mobility: Needs Assistance Bed Mobility: Supine to Sit, Sit to Supine     Supine to sit: Total assist Sit to supine: Total assist   General bed mobility comments: total assist with positioning of bed in full chair egress, pt able to lift arms to rails with min cues and pull trunk forward with min  assist. unable to hold due to fatigue. return to supine with bed functions, total +2 to slide toward [facility]    Transfers Overall transfer level: Needs assistance     Sit to Stand: +2 physical assistance, Max assist           General transfer comment: bil knees blocked with pad assist at sacrum, able to clear surface but pt maintains flexed posture    Ambulation/Gait                   Stairs             Wheelchair Mobility     Tilt Bed    Modified Rankin (Stroke Patients Only)       Balance Overall balance assessment: Needs assistance Sitting-balance support: Bilateral upper extremity supported, Feet supported Sitting balance-Leahy Scale: Poor Sitting balance - Comments: min-mod assist sitting                                    Cognition Arousal: Alert Behavior During Therapy: Flat affect Overall Cognitive Status: Difficult to assess Area of Impairment: Orientation, Memory                 Orientation Level: Disoriented to, Place   Memory: Decreased short-term memory         General Comments: pt shaking head yes/no providing limited hand gestures and trying to mouth words, following commands with increased time. pt able to mouth name, day, city, unaware of hospital        Exercises General Exercises - Lower Extremity Ankle Circles/Pumps: AAROM, Both, 10 reps, Supine Short Arc Quad: AROM, Both, 10 reps,  Seated Heel Slides: AAROM, Both, 10 reps, Seated    General Comments        Pertinent Vitals/Pain Pain Assessment Pain Assessment: CPOT Facial Expression: Relaxed, neutral Body Movements: Absence of movements Muscle Tension: Relaxed Compliance with ventilator (intubated pts.): N/A Vocalization (extubated pts.): Talking in normal tone or no sound CPOT Total: 0    Home Living                          Prior Function            PT Goals (current goals can now be found in the care plan section)  Progress towards PT goals: Progressing toward goals    Frequency    Min 1X/week      PT Plan Current plan remains appropriate    Co-evaluation              AM-PAC PT "6 Clicks" Mobility   Outcome Measure  Help needed turning from your back to your side while in a flat bed without using bedrails?: Total Help needed moving from lying on your back to sitting on the side of a flat bed without using bedrails?: Total Help needed moving to and from a bed to a chair (including a wheelchair)?: Total Help needed standing up from a chair using your arms (e.g., wheelchair or bedside chair)?: Total Help needed to walk in hospital room?: Total Help needed climbing 3-5 steps with a railing? : Total 6 Click Score: 6    End of Session   Activity Tolerance: Patient tolerated treatment well;Patient limited by fatigue Patient left: in bed;with call bell/phone within reach;with bed alarm set Nurse Communication: Mobility status;Need for lift equipment PT Visit Diagnosis: Other abnormalities of gait and mobility (R26.89);Difficulty in walking, not elsewhere classified (R26.2);Muscle weakness (generalized) (M62.81)     Time: 6578-4696 PT Time Calculation (min) (ACUTE ONLY): 29 min  Charges:    $Therapeutic Exercise: 8-22 mins $Therapeutic Activity: 8-22 mins PT General Charges $$ ACUTE PT VISIT: 1 Visit                     Merryl Hacker, PT Acute Rehabilitation Services Office: [phone]    [name]  04/24/2023, 10:11 AM

## 2023-04-24 NOTE — Progress Notes (Signed)
Referring Physician(s): Byrum, Les Pou, MD   Supervising Physician: Mir, Mauri Reading  Patient Status:  [facility] - In-pt  Chief Complaint:  Dysphagia  Subjective:  Percutaneous gastric tube placed in IR 8/7 Doing well Site is c/d/I NT In use  Allergies: Ciprofloxacin  Medications: Prior to Admission medications   Medication Sig Start Date End Date Taking? Authorizing Provider  albuterol (ACCUNEB) 0.63 MG/3ML nebulizer solution Take 1 ampule by nebulization every 4 (four) hours as needed for wheezing or shortness of breath. 09/17/21  Yes [provider]  ALPRAZolam Prudy Feeler) 1 MG tablet Take 1 mg by mouth 4 (four) times daily. 03/15/21  Yes [provider]  ANORO ELLIPTA 62.5-25 MCG/INH AEPB Inhale 1 puff into the lungs daily. 03/13/21  Yes [provider]  diphenhydrAMINE HCl, Sleep, (ZZZQUIL PO) Take 2 tablets by mouth at bedtime. Pt takes 2 gummies every night   Yes [provider]  escitalopram (LEXAPRO) 10 MG tablet Take 10 mg by mouth 2 (two) times daily. 12/16/20  Yes [provider]  gabapentin (NEURONTIN) 400 MG capsule Take 800 mg by mouth 3 (three) times daily as needed. 03/13/21  Yes [provider]  ondansetron (ZOFRAN) 4 MG tablet Take 4 mg by mouth every 8 (eight) hours as needed for nausea or vomiting. 02/20/23  Yes [provider]  PROAIR HFA 108 (90 Base) MCG/ACT inhaler Inhale 2 puffs into the lungs 4 (four) times daily. 03/13/21  Yes [provider]     Vital Signs: BP 139/70   Pulse (!) 102   Temp 98.8 F (37.1 C) (Oral)   Resp 19   Ht 5\' 5"  (1.651 m)   Wt 154 lb 5.2 oz (70 kg)   SpO2 96%   BMI 25.68 kg/m   Physical Exam Skin:    General: Skin is warm.     Comments: Site of g tube is c/d/I NT No bleeding Soft G tube in use     Imaging: IR Fluoro Guide CV Line Right  Result Date: 04/23/2023 INDICATION: 62 year old female with history of hypoxic respiratory and renal failure  requiring tunneled central venous access for hemodialysis. EXAM: TUNNELED CENTRAL VENOUS HEMODIALYSIS CATHETER PLACEMENT WITH ULTRASOUND AND FLUOROSCOPIC GUIDANCE MEDICATIONS: Ancef 2 gm IV . The antibiotic was given in an appropriate time interval prior to skin puncture. ANESTHESIA/SEDATION: Moderate (conscious) sedation was employed during this procedure. A total of Versed 3 mg and Fentanyl 150 mcg was administered intravenously. Moderate Sedation Time: 27 minutes. The patient's level of consciousness and vital signs were monitored continuously by radiology nursing throughout the procedure under my direct supervision. FLUOROSCOPY TIME:  0.6 mGy COMPLICATIONS: None immediate. PROCEDURE: Informed written consent was obtained from the patient after a discussion of the risks, benefits, and alternatives to treatment. Questions regarding the procedure were encouraged and answered. The right neck and chest were prepped with chlorhexidine in a sterile fashion, and a sterile drape was applied covering the operative field. Maximum barrier sterile technique with sterile gowns and gloves were used for the procedure. A timeout was performed prior to the initiation of the procedure. After creating a small venotomy incision, a 21 gauge micropuncture kit was utilized to access the internal jugular vein. Real-time ultrasound guidance was utilized for vascular access including the acquisition of a permanent ultrasound image documenting patency of the accessed vessel. A Rosen wire was advanced to the level of the IVC and the micropuncture sheath was exchanged for an 8 Fr dilator. A 14.5 French tunneled hemodialysis  catheter measuring 19 cm from tip to cuff was tunneled in a retrograde fashion from the anterior chest wall to the venotomy incision. Serial dilation was then performed an a peel-away sheath was placed. The catheter was then placed through the peel-away sheath with the catheter tip ultimately positioned within the right  atrium. Final catheter positioning was confirmed and documented with a spot radiographic image. The catheter aspirates and flushes normally. The catheter was flushed with appropriate volume heparin dwells. The catheter exit site was secured with a 0-Silk retention suture. The venotomy incision was closed with Dermabond. Sterile dressings were applied. The patient tolerated the procedure well without immediate post procedural complication. IMPRESSION: Successful placement of 19 cm tip to cuff tunneled hemodialysis catheter via the right internal jugular vein with catheter tip terminating within the right atrium. The catheter is ready for immediate use. Marliss Coots, MD [facility] Radiology Electronically Signed   By: Marliss Coots M.D.   On: 04/23/2023 15:52   IR US Guide Vasc Access Right  Result Date: 04/23/2023 INDICATION: 62 year old female with history of hypoxic respiratory and renal failure requiring tunneled central venous access for hemodialysis. EXAM: TUNNELED CENTRAL VENOUS HEMODIALYSIS CATHETER PLACEMENT WITH ULTRASOUND AND FLUOROSCOPIC GUIDANCE MEDICATIONS: Ancef 2 gm IV . The antibiotic was given in an appropriate time interval prior to skin puncture. ANESTHESIA/SEDATION: Moderate (conscious) sedation was employed during this procedure. A total of Versed 3 mg and Fentanyl 150 mcg was administered intravenously. Moderate Sedation Time: 27 minutes. The patient's level of consciousness and vital signs were monitored continuously by radiology nursing throughout the procedure under my direct supervision. FLUOROSCOPY TIME:  0.6 mGy COMPLICATIONS: None immediate. PROCEDURE: Informed written consent was obtained from the patient after a discussion of the risks, benefits, and alternatives to treatment. Questions regarding the procedure were encouraged and answered. The right neck and chest were prepped with chlorhexidine in a sterile fashion, and a sterile  drape was applied covering the operative field. Maximum barrier sterile technique with sterile gowns and gloves were used for the procedure. A timeout was performed prior to the initiation of the procedure. After creating a small venotomy incision, a 21 gauge micropuncture kit was utilized to access the internal jugular vein. Real-time ultrasound guidance was utilized for vascular access including the acquisition of a permanent ultrasound image documenting patency of the accessed vessel. A Rosen wire was advanced to the level of the IVC and the micropuncture sheath was exchanged for an 8 Fr dilator. A 14.5 French tunneled hemodialysis catheter measuring 19 cm from tip to cuff was tunneled in a retrograde fashion from the anterior chest wall to the venotomy incision. Serial dilation was then performed an a peel-away sheath was placed. The catheter was then placed through the peel-away sheath with the catheter tip ultimately positioned within the right atrium. Final catheter positioning was confirmed and documented with a spot radiographic image. The catheter aspirates and flushes normally. The catheter was flushed with appropriate volume heparin dwells. The catheter exit site was secured with a 0-Silk retention suture. The venotomy incision was closed with Dermabond. Sterile dressings were applied. The patient tolerated the procedure well without immediate post procedural complication. IMPRESSION: Successful placement of 19 cm tip to cuff tunneled hemodialysis catheter via the right internal jugular vein with catheter tip terminating within the right atrium. The catheter is ready for immediate use. Marliss Coots, MD [facility] Radiology Electronically Signed   By: Jae Dire.D.  On: 04/23/2023 15:52   IR GASTROSTOMY TUBE MOD SED  Result Date: 04/23/2023 INDICATION: 62 year old female with history of hypoxic respiratory failure status post tracheostomy  presenting for percutaneous gastrostomy tube placement. EXAM: PERC PLACEMENT GASTROSTOMY MEDICATIONS: Ancef 2 gm IV; Antibiotics were administered within 1 hour of the procedure. ANESTHESIA/SEDATION: Versed 3 mg IV; Fentanyl 150 mcg IV Moderate Sedation Time:  27 The patient was continuously monitored during the procedure by the interventional radiology nurse under my direct supervision. CONTRAST:  15mL OMNIPAQUE IOHEXOL 300 MG/ML SOLN - administered into the gastric lumen. FLUOROSCOPY TIME:  Two mGy COMPLICATIONS: None immediate. PROCEDURE: Informed written consent was obtained from the patient after a thorough discussion of the procedural risks, benefits and alternatives. All questions were addressed. Maximal Sterile barrier Technique was utilized including caps, mask, sterile gowns, sterile gloves, sterile drape, hand hygiene and skin antiseptic. A timeout was performed prior to the initiation of the procedure. The patient was placed on the procedure table in the supine position. Pre-procedure abdominal film confirmed visualization of the transverse colon. The patient was prepped and draped in usual sterile fashion. The stomach was insufflated with air via the indwelling nasogastric tube. Under fluoroscopy, a puncture site was selected and local analgesia achieved with 1% lidocaine infiltrated subcutaneously. Under fluoroscopic guidance, a gastropexy needle was passed into the stomach and the T-bar suture was released. Entry into the stomach was confirmed with fluoroscopy, aspiration of air, and injection of contrast material. This was repeated with an additional gastropexy suture (for a total of 2 fasteners). At the center of these gastropexy sutures, a dermatotomy was performed. An 18 gauge needle was passed into the stomach at the site of this dermatotomy, and position within the gastric lumen again confirmed under fluoroscopy using aspiration of air and contrast injection. An Amplatz guidewire was passed  through this needle and intraluminal placement within the stomach was confirmed by fluoroscopy. The needle was removed. Over the guidewire, the percutaneous tract was dilated using a 10 mm non-compliant balloon. The balloon was deflated, then pushed into the gastric lumen followed in concert by the 20 Fr gastrostomy tube. The retention balloon of the percutaneous gastrostomy tube was inflated with 20 mL of sterile water. The tube was withdrawn until the retention balloon was at the edge of the gastric lumen. The external bumper was brought to the abdominal wall. Contrast was injected through the gastrostomy tube, confirming intraluminal positioning. The patient tolerated the procedure well without any immediate post-procedural complications. IMPRESSION: Technically successful placement of 20 Fr gastrostomy tube. Marliss Coots, MD [facility] Radiology Electronically Signed   By: Marliss Coots M.D.   On: 04/23/2023 15:51   DG CHEST PORT 1 VIEW  Result Date: 04/21/2023 CLINICAL DATA:  Respiratory failure. EXAM: PORTABLE CHEST 1 VIEW COMPARISON:  04/15/2023 FINDINGS: Tracheostomy tube in satisfactory position. Feeding tube with the tip coursing below the diaphragm excluded from the field of view. Lungs are hyperinflated as can be seen with COPD. Prominent stable interstitial markings at the lung bases bilaterally. No new focal parenchymal opacity to suggest pneumonia. No pleural effusion or pneumothorax. Heart and mediastinal contours are unremarkable. No acute osseous abnormality. IMPRESSION: 1. COPD. 2. No acute cardiopulmonary disease. Electronically Signed   By: Elige Ko M.D.   On: 04/21/2023 12:18    Labs:  CBC: Recent Labs    04/21/23 0131 04/22/23 0056 04/22/23 1208 04/23/23 0354  WBC 12.4* 13.2* 14.0* 11.9*  HGB 7.6* 6.3* 7.9* 7.6*  HCT 24.5*  20.9* 25.5* 23.9*  PLT 227 193 209 195    COAGS: Recent Labs    04/07/23 0417 04/08/23 0506  04/17/23 1142 04/19/23 0648 04/23/23 0354  INR  --   --  1.1 1.0 1.0  APTT 29 52* 41* 27  --     BMP: Recent Labs    04/17/23 0530 04/18/23 1232 04/21/23 0131 04/22/23 0056 04/23/23 0354 04/24/23 0136  NA SPECIMEN HEMOLYZED. HEMOLYSIS MAY AFFECT INTEGRITY OF RESULTS.   < > 135 133* 133* 132*  K SPECIMEN HEMOLYZED. HEMOLYSIS MAY AFFECT INTEGRITY OF RESULTS.   < > 5.7* 5.2* 6.0* 3.9  CL SPECIMEN HEMOLYZED. HEMOLYSIS MAY AFFECT INTEGRITY OF RESULTS.   < > 94* 93* 95* 92*  CO2 SPECIMEN HEMOLYZED. HEMOLYSIS MAY AFFECT INTEGRITY OF RESULTS.   < > 26 26 23 26   GLUCOSE SPECIMEN HEMOLYZED. HEMOLYSIS MAY AFFECT INTEGRITY OF RESULTS.   < > 139* 141* 110* 119*  BUN SPECIMEN HEMOLYZED. HEMOLYSIS MAY AFFECT INTEGRITY OF RESULTS.   < > 137* 78* 128* 65*  CALCIUM SPECIMEN HEMOLYZED. HEMOLYSIS MAY AFFECT INTEGRITY OF RESULTS.   < > 8.5* 8.1* 8.5* 8.6*  CREATININE SPECIMEN HEMOLYZED. HEMOLYSIS MAY AFFECT INTEGRITY OF RESULTS.   < > 5.96* 3.62* 5.01* 3.63*  GFRNONAA SPECIMEN HEMOLYZED. HEMOLYSIS MAY AFFECT INTEGRITY OF RESULTS.   < > 7* 14* 9* 14*  GFRAA SPECIMEN HEMOLYZED. HEMOLYSIS MAY AFFECT INTEGRITY OF RESULTS.  --   --   --   --   --    < > = values in this interval not displayed.    LIVER FUNCTION TESTS: Recent Labs    04/01/23 1602 04/03/23 0253 04/06/23 0336 04/06/23 2031 04/11/23 0804 04/12/23 0427 04/18/23 1232 04/19/23 0832  BILITOT 0.8  --  0.7  --   --   --  0.2* 0.8  AST 14*  --  15  --   --   --  127* 80*  ALT 17  --  15  --   --   --  26 28  ALKPHOS 46  --  49  --   --   --  61 69  PROT 5.1*  --  5.5*  --   --   --  6.1* 6.2*  ALBUMIN 2.2*   < > 2.5*  2.4*   < > 2.0* 2.2* 2.4* 2.5*   < > = values in this interval not displayed.    Assessment and Plan:  Percutaneous G tube in place In use Call if any IR needs  Electronically Signed: Robet Leu, PA-C 04/24/2023, 11:34 AM   I spent a total of 15 Minutes at the the patient's bedside AND on the patient's  hospital floor or unit, greater than 50% of which was counseling/coordinating care for G tube

## 2023-04-24 NOTE — Progress Notes (Signed)
NAME:  Tamara Castro, MRN:  [id number], DOB:  [birth year], LOS: 23 ADMISSION DATE:  04/01/2023  History of Present Illness:  62 year old with COPD, chronic constipation, colectomy in 1990s due to diverticulitis, fibromyalgia on disability since 2008, depression and anxiety, neuropathy.  Presented to [facility] on 03/21/2023 after a week of URI symptoms and was admitted for acute hypoxic and hypercapnic respiratory failure due to right lower lobe pneumonia.  She required intubation and pressor support for septic shock.  She had an AKI and acute heart failure with preserved LVEF and reduced RV EF.  PE ruled out by CT at that time.  She initially improved with antibiotics for CAP coverage and was extubated but then reintubated after clinically worsening.  Hospital course has been complicated by acute on chronic ileus, progression to renal failure, now on HD, prolonged mechanical ventilation requiring tracheostomy, and gastrostomy tube dependence.  Significant Hospital Events:  7/5 admitted [facility] CTX and azith started 7/7 extubated. Vanc added and cefepime started; CTX stopped 7/8 vanc stopped 7/10 reintubated  7/16 tx to [facility]. Cefepime stopped 7/17 attempting lasix. Creatinine worse.  Echo EF 60-65%no WMA RV nml  7/18 cr worse after lasix. Weaning but on-going ileus and cr rising presenting obstacle to extubation Trying neostigmine.  7/19 scr worse in spite of holding lasix. Still on low dose precedex. Mental status and renal fxn still barrier to extubation. Nephro consulted > started CRRT.  HD catheter placed. 7/20 [place] heparin stopped d/t bleeding around HD cath. Got DDAVP.  7/21 resumes cefepime for worsening leukocytosis.  7/22 volumes ok w/ SBT but has sig accessory use  7/23- Trach performed at bedside. CRRT holiday per nephrology. Increased Xanax to home dose 1mg  QID- added seroquel 25mg  BID 7/24- has successfully weaned off propofol and converted to precedex infusion. Tolerating 4+  hours of SBT 7/25- discontinued precedex and fentanyl with as needed fentanyl pushes for pain. Tolerated SBT for 3 hours before failing and being placed on full vent support. Oliguric with rising serum creatinine- nephrology requests transfer to [facility] for restarting dialysis  7/26 multiple emesis, NG to LIS, EEG neg , HD session 7/27 DC Seroquel, decrease Xanax 0.5 twice daily 7/28 Code stroke called for unequal pupils this a.m., head CT negative, seen by neurology; MRI neg 7/31 Hgb drop to 5.5, 2 u prbc, DIC panel Lower extremity Dopplers 8/3 >>  CT abdomen 8/3 >> near complete resolution of pleural effusions, there is a safe window for PEG tube placement 04/23/2023 IR placed tunneled right IJ HD catheter and percutaneous gastrostomy tube  Interim History / Subjective:   Right IJ trialysis catheter DC'd Afebrile PRVC 14/450/5/40% 2500 ultrafiltrate removed with dialysis  Objective   Blood pressure 125/75, pulse 84, temperature 98.8 F (37.1 C), temperature source Oral, resp. rate 14, height 5\' 5"  (1.651 m), weight 70 kg, SpO2 100%.    Vent Mode: PRVC FiO2 (%):  [40 %] 40 % Set Rate:  [14 bmp] 14 bmp Vt Set:  [450 mL] 450 mL PEEP:  [5 cmH20] 5 cmH20 Pressure Support:  [5 cmH20] 5 cmH20 Plateau Pressure:  [14 cmH20-17 cmH20] 14 cmH20   Intake/Output Summary (Last 24 hours) at 04/24/2023 0732 Last data filed at 04/24/2023 7829 Gross per 24 hour  Intake 370.83 ml  Output 2600 ml  Net -2229.17 ml   Filed Weights   04/23/23 0500 04/23/23 0906 04/23/23 1214  Weight: 72.3 kg 72.3 kg 70 kg    Examination: No apparent distress Tracheostomy tube connected  to vent Breathing comfortably on pressure support with minimal settings, breath sounds clear Heart rate and rhythm normal, strong radial pulses, some peripheral edema Abdomen is nontender Skin is warm and dry, right IJ HD cath is nontender and clean appearing  Labs: Sodium 132 Potassium 3.9 Bicarb 26 BUN 128 => 65 Mag  1.7 Phos 5.1  Resolved Hospital Problem list   Resolved Problems:   CAP (community acquired pneumonia)   Septic shock (HCC)   COPD with acute exacerbation (HCC)   Acute heart failure with preserved ejection fraction (HCC)   Elevated troponin   Metabolic acidosis   Assessment & Plan:  Principal Problem:   Acute hypoxemic respiratory failure (HCC) Active Problems:   Aspiration pneumonia (HCC)   Pressure injury of skin   Ileus (HCC)   Volume overload   AKI (acute kidney injury) (HCC)   COPD (chronic obstructive pulmonary disease) (HCC)   Tobacco abuse   (HFpEF) heart failure with preserved ejection fraction (HCC)   Leukocytosis   CAD (coronary artery disease)   Anemia   Thrombocytopenia (HCC)   Acute encephalopathy   Depression   Anxiety   Tracheostomy status (HCC)  Prolonged mechanical ventilation requiring tracheostomy Community-acquired pneumonia, resolved History of COPD - Pulmonary hygiene - Push SBT, PSV - ATC as tolerated - Nebulized LAMA/LABA/ICS  Anemia of critical illness - Persistent hemoglobin drifted downward despite no obvious sources of bleeding or hemolysis - Low suspicion for DIC - Suspect frequent phlebotomy might be part of the problem, back off on lab draws - Transfuse for hemoglobin <7 g/dL - She is iron replete  History of anxiety - Gabapentin 200 mg nightly - Clonazepam 0.5 mg twice daily  Acute tubular necrosis, now on intermittent HD - Appreciate nephrology management - Tunneled HD cath with IR planned for today  Ileus, resolved - Continue tube feeds via gastrostomy tube  Nutrition Status: Nutrition Problem: Inadequate oral intake Etiology: inability to eat Signs/Symptoms: NPO status Interventions: Refer to RD note for recommendations  In vitro hemolysis - Drawn necessary labs from peripheral sites  Stable for transition to [employer] practice (daily eval):  Diet/type: tubefeeds DVT prophylaxis: LMWH, held for IR procedure  today GI prophylaxis: PPI Lines: Dialysis Catheter, tunneled HD cath by IR today Foley:  N/A Code Status:  DNR  Marrianne Mood MD 04/24/2023, 7:32 AM  Pager: [phone]

## 2023-04-24 NOTE — Progress Notes (Signed)
Occupational Therapy Treatment Patient Details Name: JAMESON GHARIB MRN: [id number] DOB: 1961-05-08 Today's Date: 04/24/2023   History of present illness 62 yo female admitted to [facility] 7/5 with RLL PNA, sepsis and acute HF. Intubated (7/5-7/7, bipap 7/7-7/9, 7/10-present). 7/16 transfer to [facility]. 7/19-7/23 CRRT. 7/23 trach. 7/25 transfer to  [facility] for HD. 7/26 EEG with encephalopathy. 8/7 TDC placed along with g-tube. PMhx: COPD, tobacco abuse, ileus, anxiety, CAD   OT comments  Pt progressing toward established OT goals. Pt with significantly greater attempts to communicate this session and following commands with increased time. Transitioned into sitting with egress function of bed to chair position and then pt pulling forward into sitting with min A to bring back off bed first attempt; able to sustain ~5 seconds. Requiring mod A for second attempt and to sustain back off bed for 2 minutes. BUE AAROM to optimize strength and endurance. Pt will benefit from continued acute OT services and OT at [facility].       If plan is discharge home, recommend the following:  A lot of help with walking and/or transfers;A lot of help with bathing/dressing/bathroom;Assistance with cooking/housework;Assistance with feeding;Direct supervision/assist for medications management;Assist for transportation;Direct supervision/assist for financial management;Help with stairs or ramp for entrance   Equipment Recommendations  None recommended by OT    Recommendations for Other Services      Precautions / Restrictions Precautions Precautions: Other (comment);Fall Precaution Comments: trach, flexiseal, g tube Restrictions Weight Bearing Restrictions: No       Mobility Bed Mobility Overal bed mobility: Needs Assistance Bed Mobility: Supine to Sit, Sit to Supine     Supine to sit: Total assist Sit to supine: Total assist   General bed mobility comments: total assist with positioning of bed in full  chair egress, pt able to lift arms to rails with min cues and pull trunk forward with min assist. unable to hold due to fatigue. return to supine with bed functions, total +2 to slide toward [facility]    Transfers                   General transfer comment: not attempted this session     Balance Overall balance assessment: Needs assistance Sitting-balance support: Bilateral upper extremity supported, Feet supported Sitting balance-Leahy Scale: Poor Sitting balance - Comments: min-mod assist sitting                                   ADL either performed or assessed with clinical judgement   ADL Overall ADL's : Needs assistance/impaired                                       General ADL Comments: Focus session on egress with bed and trasition into unsupported sitting. Pt reporting very fatigued from PT session earlier today. Pt able with good tolerance of chair position and VSS. Able to bring back off of bed one repetition this session and maintain <5 seconds without needing assist. Mod A for additional attempt and able to maintain for ~2 minutes prior to fatiguing/asking to return to supine. BUE A/AAROM see below.    Extremity/Trunk Assessment Upper Extremity Assessment Upper Extremity Assessment: Right hand dominant;Left hand dominant RUE Deficits / Details: 2-/5 shoulder flexion, elbow flexion and extension, weak grip RUE Sensation: decreased light touch RUE Coordination: decreased fine motor;decreased  gross motor LUE Deficits / Details: 2-/5 shoulder flexion, elbow flexion and extension, weak grip LUE Sensation: decreased light touch LUE Coordination: decreased fine motor;decreased gross motor   Lower Extremity Assessment Lower Extremity Assessment: Defer to PT evaluation        Vision   Vision Assessment?: Vision impaired- to be further tested in functional context Additional Comments: making good eye contact with therapist this session;  especially on R, more difficult on L   Perception     Praxis      Cognition Arousal: Alert Behavior During Therapy: Flat affect Overall Cognitive Status: Difficult to assess Area of Impairment: Orientation, Memory, Following commands, Problem solving                 Orientation Level: Disoriented to, Place   Memory: Decreased short-term memory Following Commands: Follows one step commands consistently, Follows one step commands with increased time     Problem Solving: Slow processing, Decreased initiation, Requires verbal cues General Comments: pt shaking head yes/no providing limited hand gestures and trying to mouth words, following commands with increased time. pt able to mouth name, day, city, unaware of hospital        Exercises Exercises: Other exercises General Exercises - Upper Extremity Shoulder Flexion: AAROM, Both, 10 reps, Seated Shoulder ABduction: PROM, Both, 10 reps, Supine Elbow Flexion: AAROM, Both, 10 reps, Seated Elbow Extension: AAROM, Both, 10 reps, Seated Other Exercises Other Exercises: AAROM cervical rotation    Shoulder Instructions       General Comments VSS with one desat to 85 requiring min cues to focus on breathing and relax in seated chair position. On PRVC 35%FiO2. 10 L    Pertinent Vitals/ Pain       Pain Assessment Pain Assessment: CPOT Faces Pain Scale: No hurt Facial Expression: Relaxed, neutral Body Movements: Protection Muscle Tension: Relaxed Compliance with ventilator (intubated pts.): N/A Vocalization (extubated pts.): Talking in normal tone or no sound CPOT Total: 1 Pain Location: grimacing with mobilty Pain Descriptors / Indicators: Grimacing Pain Intervention(s): Limited activity within patient's tolerance, Monitored during session  Home Living                                          Prior Functioning/Environment              Frequency  Min 1X/week        Progress Toward  Goals  OT Goals(current goals can now be found in the care plan section)  Progress towards OT goals: Progressing toward goals  Acute Rehab OT Goals Patient Stated Goal: rest OT Goal Formulation: With patient Time For Goal Achievement: 04/30/23 Potential to Achieve Goals: Good ADL Goals Pt Will Perform Grooming: with mod assist Pt Will Perform Upper Body Bathing: with mod assist Additional ADL Goal #1: pt will complete bed mobility with max A as a precursor to ADLs Additional ADL Goal #2: Pt will express needs with communication board and min A  Plan      Co-evaluation                 AM-PAC OT "6 Clicks" Daily Activity     Outcome Measure   Help from another person eating meals?: Total Help from another person taking care of personal grooming?: A Lot Help from another person toileting, which includes using toliet, bedpan, or urinal?: Total Help from another person bathing (including  washing, rinsing, drying)?: Total Help from another person to put on and taking off regular upper body clothing?: Total Help from another person to put on and taking off regular lower body clothing?: Total 6 Click Score: 7    End of Session Equipment Utilized During Treatment: Oxygen (trach)  OT Visit Diagnosis: Other abnormalities of gait and mobility (R26.89);Muscle weakness (generalized) (M62.81)   Activity Tolerance Patient tolerated treatment well   Patient Left in bed;with call bell/phone within reach   Nurse Communication Mobility status        Time: 1610-9604 OT Time Calculation (min): 26 min  Charges: OT General Charges $OT Visit: 1 Visit OT Treatments $Therapeutic Activity: 8-22 mins $Therapeutic Exercise: 8-22 mins  Myrla Halsted, OTD, OTR/L [facility] Acute Rehabilitation Office: [phone]   Myrla Halsted 04/24/2023, 3:21 PM

## 2023-04-24 NOTE — Progress Notes (Signed)
[facility] Progress Note   Assessment/ Plan:   Acute kidney injury - b/l creat 1.0 from jan 2023. Creat here was 1.6 on admission in setting of pneumonia, hypotension and septic shock. Creat peaked at 1.7 and then returned to 1.0 on 7/12. Renal failure then worsened again up to 3.84 and CRRT started 7/19. AKI felt to be related to hypotension and IV contrast +/- diuretics. Had a two day CRRT holiday which showed min UOP.   - now on MWF IHD  next tomorrow now via [facility]   - tunneled HD cath done yesterday  - OK for heparin- no evidence of HIT, in vitro hemolysis Sepsis/ shock/ hypotension - initial dx on admission was pna and septic shock, now resolved.  Acute hypoxic respiratory failure - due to COPD/ pna, on vent w/ trach per CCM. F/u CXR 7/23 was negative for edema. Attempting to wean vent Volume excess/ lower extremity edema-  much improved-  soft BP limits UF some Anemia - Hb 7- 8 range, tranfuse prn-   blood given 8/6. Have added ESA  Anxiety/ depression Dispo-  possibly planning for LTAC Hyperkalemia-  had been an issue  -  is better today after HD  Subjective:    Seen in room.  Had dialysis yesterday -  removed 2500 tolerated well-  labs reflective of dialysis -  also got TDC and PEG yesterday    Objective:   BP 125/75   Pulse 84   Temp 98.8 F (37.1 C) (Oral)   Resp 14   Ht 5\' 5"  (1.651 m)   Wt 70 kg   SpO2 100%   BMI 25.68 kg/m   Intake/Output Summary (Last 24 hours) at 04/24/2023 0644 Last data filed at 04/24/2023 9147 Gross per 24 hour  Intake 370.83 ml  Output 2600 ml  Net -2229.17 ml   Weight change: 0 kg  Physical Exam: Gen:ill-appearing but awake-   understands what I am saying to her CVS: RRR Resp: + trach, coarse mech bilaterally Abd: + soft Ext: min edema, UE greater than LE, improving ACCESS: R internal jugular tunneled HD Cath  Imaging: IR Fluoro Guide CV Line Right  Result Date: 04/23/2023 INDICATION: 62 year old female with history of  hypoxic respiratory and renal failure requiring tunneled central venous access for hemodialysis. EXAM: TUNNELED CENTRAL VENOUS HEMODIALYSIS CATHETER PLACEMENT WITH ULTRASOUND AND FLUOROSCOPIC GUIDANCE MEDICATIONS: Ancef 2 gm IV . The antibiotic was given in an appropriate time interval prior to skin puncture. ANESTHESIA/SEDATION: Moderate (conscious) sedation was employed during this procedure. A total of Versed 3 mg and Fentanyl 150 mcg was administered intravenously. Moderate Sedation Time: 27 minutes. The patient's level of consciousness and vital signs were monitored continuously by radiology nursing throughout the procedure under my direct supervision. FLUOROSCOPY TIME:  0.6 mGy COMPLICATIONS: None immediate. PROCEDURE: Informed written consent was obtained from the patient after a discussion of the risks, benefits, and alternatives to treatment. Questions regarding the procedure were encouraged and answered. The right neck and chest were prepped with chlorhexidine in a sterile fashion, and a sterile drape was applied covering the operative field. Maximum barrier sterile technique with sterile gowns and gloves were used for the procedure. A timeout was performed prior to the initiation of the procedure. After creating a small venotomy incision, a 21 gauge micropuncture kit was utilized to access the internal jugular vein. Real-time ultrasound guidance was utilized for vascular access including the acquisition of a permanent ultrasound image documenting patency of the accessed vessel. A Pollyann Kennedy  wire was advanced to the level of the IVC and the micropuncture sheath was exchanged for an 8 Fr dilator. A 14.5 French tunneled hemodialysis catheter measuring 19 cm from tip to cuff was tunneled in a retrograde fashion from the anterior chest wall to the venotomy incision. Serial dilation was then performed an a peel-away sheath was placed. The catheter was then placed through the peel-away sheath with the catheter tip  ultimately positioned within the right atrium. Final catheter positioning was confirmed and documented with a spot radiographic image. The catheter aspirates and flushes normally. The catheter was flushed with appropriate volume heparin dwells. The catheter exit site was secured with a 0-Silk retention suture. The venotomy incision was closed with Dermabond. Sterile dressings were applied. The patient tolerated the procedure well without immediate post procedural complication. IMPRESSION: Successful placement of 19 cm tip to cuff tunneled hemodialysis catheter via the right internal jugular vein with catheter tip terminating within the right atrium. The catheter is ready for immediate use. Marliss Coots, MD [facility] Radiology Electronically Signed   By: Marliss Coots M.D.   On: 04/23/2023 15:52   IR US Guide Vasc Access Right  Result Date: 04/23/2023 INDICATION: 62 year old female with history of hypoxic respiratory and renal failure requiring tunneled central venous access for hemodialysis. EXAM: TUNNELED CENTRAL VENOUS HEMODIALYSIS CATHETER PLACEMENT WITH ULTRASOUND AND FLUOROSCOPIC GUIDANCE MEDICATIONS: Ancef 2 gm IV . The antibiotic was given in an appropriate time interval prior to skin puncture. ANESTHESIA/SEDATION: Moderate (conscious) sedation was employed during this procedure. A total of Versed 3 mg and Fentanyl 150 mcg was administered intravenously. Moderate Sedation Time: 27 minutes. The patient's level of consciousness and vital signs were monitored continuously by radiology nursing throughout the procedure under my direct supervision. FLUOROSCOPY TIME:  0.6 mGy COMPLICATIONS: None immediate. PROCEDURE: Informed written consent was obtained from the patient after a discussion of the risks, benefits, and alternatives to treatment. Questions regarding the procedure were encouraged and answered. The right neck and chest were prepped with chlorhexidine  in a sterile fashion, and a sterile drape was applied covering the operative field. Maximum barrier sterile technique with sterile gowns and gloves were used for the procedure. A timeout was performed prior to the initiation of the procedure. After creating a small venotomy incision, a 21 gauge micropuncture kit was utilized to access the internal jugular vein. Real-time ultrasound guidance was utilized for vascular access including the acquisition of a permanent ultrasound image documenting patency of the accessed vessel. A Rosen wire was advanced to the level of the IVC and the micropuncture sheath was exchanged for an 8 Fr dilator. A 14.5 French tunneled hemodialysis catheter measuring 19 cm from tip to cuff was tunneled in a retrograde fashion from the anterior chest wall to the venotomy incision. Serial dilation was then performed an a peel-away sheath was placed. The catheter was then placed through the peel-away sheath with the catheter tip ultimately positioned within the right atrium. Final catheter positioning was confirmed and documented with a spot radiographic image. The catheter aspirates and flushes normally. The catheter was flushed with appropriate volume heparin dwells. The catheter exit site was secured with a 0-Silk retention suture. The venotomy incision was closed with Dermabond. Sterile dressings were applied. The patient tolerated the procedure well without immediate post procedural complication. IMPRESSION: Successful placement of 19 cm tip to cuff tunneled hemodialysis catheter via the right internal jugular vein with catheter tip terminating within the right atrium. The catheter is  ready for immediate use. Marliss Coots, MD [facility] Radiology Electronically Signed   By: Marliss Coots M.D.   On: 04/23/2023 15:52   IR GASTROSTOMY TUBE MOD SED  Result Date: 04/23/2023 INDICATION: 62 year old female with history of hypoxic respiratory  failure status post tracheostomy presenting for percutaneous gastrostomy tube placement. EXAM: PERC PLACEMENT GASTROSTOMY MEDICATIONS: Ancef 2 gm IV; Antibiotics were administered within 1 hour of the procedure. ANESTHESIA/SEDATION: Versed 3 mg IV; Fentanyl 150 mcg IV Moderate Sedation Time:  27 The patient was continuously monitored during the procedure by the interventional radiology nurse under my direct supervision. CONTRAST:  15mL OMNIPAQUE IOHEXOL 300 MG/ML SOLN - administered into the gastric lumen. FLUOROSCOPY TIME:  Two mGy COMPLICATIONS: None immediate. PROCEDURE: Informed written consent was obtained from the patient after a thorough discussion of the procedural risks, benefits and alternatives. All questions were addressed. Maximal Sterile barrier Technique was utilized including caps, mask, sterile gowns, sterile gloves, sterile drape, hand hygiene and skin antiseptic. A timeout was performed prior to the initiation of the procedure. The patient was placed on the procedure table in the supine position. Pre-procedure abdominal film confirmed visualization of the transverse colon. The patient was prepped and draped in usual sterile fashion. The stomach was insufflated with air via the indwelling nasogastric tube. Under fluoroscopy, a puncture site was selected and local analgesia achieved with 1% lidocaine infiltrated subcutaneously. Under fluoroscopic guidance, a gastropexy needle was passed into the stomach and the T-bar suture was released. Entry into the stomach was confirmed with fluoroscopy, aspiration of air, and injection of contrast material. This was repeated with an additional gastropexy suture (for a total of 2 fasteners). At the center of these gastropexy sutures, a dermatotomy was performed. An 18 gauge needle was passed into the stomach at the site of this dermatotomy, and position within the gastric lumen again confirmed under fluoroscopy using aspiration of air and contrast injection. An  Amplatz guidewire was passed through this needle and intraluminal placement within the stomach was confirmed by fluoroscopy. The needle was removed. Over the guidewire, the percutaneous tract was dilated using a 10 mm non-compliant balloon. The balloon was deflated, then pushed into the gastric lumen followed in concert by the 20 Fr gastrostomy tube. The retention balloon of the percutaneous gastrostomy tube was inflated with 20 mL of sterile water. The tube was withdrawn until the retention balloon was at the edge of the gastric lumen. The external bumper was brought to the abdominal wall. Contrast was injected through the gastrostomy tube, confirming intraluminal positioning. The patient tolerated the procedure well without any immediate post-procedural complications. IMPRESSION: Technically successful placement of 20 Fr gastrostomy tube. Marliss Coots, MD [facility] Radiology Electronically Signed   By: Marliss Coots M.D.   On: 04/23/2023 15:51    Labs: BMET Recent Labs  Lab 04/18/23 1232 04/19/23 0832 04/20/23 0022 04/21/23 0131 04/22/23 0056 04/23/23 0354 04/24/23 0136  NA 137 136 135 135 133* 133* 132*  K 4.3 4.4 4.5 5.7* 5.2* 6.0* 3.9  CL 98 91* 94* 94* 93* 95* 92*  CO2 23 28 27 26 26 23 26   GLUCOSE 133* 160* 148* 139* 141* 110* 119*  BUN 110* 58* 87* 137* 78* 128* 65*  CREATININE 6.24* 3.38* 4.38* 5.96* 3.62* 5.01* 3.63*  CALCIUM 9.6 9.1 8.7* 8.5* 8.1* 8.5* 8.6*   CBC Recent Labs  Lab 04/21/23 0131 04/22/23 0056 04/22/23 1208 04/23/23 0354  WBC 12.4* 13.2* 14.0* 11.9*  HGB 7.6* 6.3* 7.9* 7.6*  HCT 24.5* 20.9* 25.5* 23.9*  MCV 93.2 94.1 93.4 94.1  PLT 227 193 209 195    Medications:     arformoterol  15 mcg Nebulization BID   atorvastatin  20 mg Per Tube Daily   budesonide (PULMICORT) nebulizer solution  0.5 mg Nebulization BID   Chlorhexidine Gluconate Cloth  6 each Topical Q0600   Chlorhexidine Gluconate Cloth  6 each  Topical Q0600   clonazePAM  0.5 mg Per Tube BID   darbepoetin (ARANESP) injection - DIALYSIS  150 mcg Subcutaneous Q Mon-1800   escitalopram  10 mg Per Tube Daily   fiber  1 packet Per Tube BID   gabapentin  200 mg Per Tube QHS   Gerhardt's butt cream   Topical BID   heparin injection (subcutaneous)  5,000 Units Subcutaneous Q8H   insulin aspart  0-9 Units Subcutaneous Q4H   multivitamin  1 tablet Per Tube QHS   mouth rinse  15 mL Mouth Rinse Q2H   pantoprazole (PROTONIX) IV  40 mg Intravenous Q24H   revefenacin  175 mcg Nebulization Daily   saccharomyces boulardii  250 mg Per Tube BID   sodium chloride flush  10-40 mL Intracatheter Q12H   sodium chloride flush  3 mL Intravenous Q12H    A   04/24/2023, 6:44 AM

## 2023-04-25 ENCOUNTER — Inpatient Hospital Stay (HOSPITAL_COMMUNITY): Payer: 59

## 2023-04-25 DIAGNOSIS — J9601 Acute respiratory failure with hypoxia: Secondary | ICD-10-CM | POA: Diagnosis not present

## 2023-04-25 LAB — CBC
HCT: 29.2 % — ABNORMAL LOW (ref 36.0–46.0)
Hemoglobin: 9.3 g/dL — ABNORMAL LOW (ref 12.0–15.0)
MCH: 30.3 pg (ref 26.0–34.0)
MCHC: 31.8 g/dL (ref 30.0–36.0)
MCV: 95.1 fL (ref 80.0–100.0)
Platelets: 224 10*3/uL (ref 150–400)
RBC: 3.07 MIL/uL — ABNORMAL LOW (ref 3.87–5.11)
RDW: 18.7 % — ABNORMAL HIGH (ref 11.5–15.5)
WBC: 12.7 10*3/uL — ABNORMAL HIGH (ref 4.0–10.5)
nRBC: 0.3 % — ABNORMAL HIGH (ref 0.0–0.2)

## 2023-04-25 LAB — BLOOD GAS, VENOUS
Acid-Base Excess: 3.2 mmol/L — ABNORMAL HIGH (ref 0.0–2.0)
Bicarbonate: 32.8 mmol/L — ABNORMAL HIGH (ref 20.0–28.0)
O2 Saturation: 88.1 %
Patient temperature: 37
pCO2, Ven: 84 mmHg (ref 44–60)
pH, Ven: 7.2 — ABNORMAL LOW (ref 7.25–7.43)
pO2, Ven: 62 mmHg — ABNORMAL HIGH (ref 32–45)

## 2023-04-25 LAB — GLUCOSE, CAPILLARY
Glucose-Capillary: 113 mg/dL — ABNORMAL HIGH (ref 70–99)
Glucose-Capillary: 124 mg/dL — ABNORMAL HIGH (ref 70–99)
Glucose-Capillary: 126 mg/dL — ABNORMAL HIGH (ref 70–99)
Glucose-Capillary: 128 mg/dL — ABNORMAL HIGH (ref 70–99)
Glucose-Capillary: 136 mg/dL — ABNORMAL HIGH (ref 70–99)
Glucose-Capillary: 159 mg/dL — ABNORMAL HIGH (ref 70–99)
Glucose-Capillary: 164 mg/dL — ABNORMAL HIGH (ref 70–99)

## 2023-04-25 MED ORDER — HYDROXYZINE HCL 25 MG PO TABS
25.0000 mg | ORAL_TABLET | Freq: Two times a day (BID) | ORAL | Status: DC | PRN
  Administered 2023-04-26 – 2023-04-27 (×3): 25 mg
  Filled 2023-04-25 (×3): qty 1

## 2023-04-25 MED ORDER — HYDROXYZINE HCL 25 MG PO TABS
25.0000 mg | ORAL_TABLET | Freq: Three times a day (TID) | ORAL | Status: DC | PRN
  Administered 2023-04-25: 25 mg
  Filled 2023-04-25: qty 1

## 2023-04-25 MED ORDER — NUTRISOURCE FIBER PO PACK
1.0000 | PACK | Freq: Three times a day (TID) | ORAL | Status: DC
  Administered 2023-04-25 – 2023-04-28 (×8): 1
  Filled 2023-04-25 (×11): qty 1

## 2023-04-25 NOTE — Progress Notes (Signed)
   04/25/23 1800  Vitals  Temp 98 F (36.7 C)  Pulse Rate (!) 112  Resp (!) 24  BP (!) 173/115  SpO2 92 %  O2 Device Tracheostomy Collar;Ventilator  Weight  (unable to weigh---innacurate weights)  Type of Weight Pre-Dialysis  Oxygen Therapy  FiO2 (%) 60 %  Patient Activity (if Appropriate) In bed  Pulse Oximetry Type Continuous  Oximetry Probe Site Changed No  Post Treatment  Dialyzer Clearance Lightly streaked  Duration of HD Treatment -hour(s) 3.5 hour(s)  Hemodialysis Intake (mL) 0 mL  Liters Processed 67  Fluid Removed (mL) 2400 mL  Tolerated HD Treatment Yes  Post-Hemodialysis Comments see notes     Informed consent signed and in chart.   TX duration:3.30  Vs stable post tx---afebrile---  Pt is bedside in the ICU  Hand-off given to patient's nurse.   Access used: [facility] Access issues: extremely positional--bilateral lumens--alarms machine with each cough  Total UF removed: 2400 Medication(s) given: none per hd   [facility]

## 2023-04-25 NOTE — Progress Notes (Signed)
[facility] Progress Note   Assessment/ Plan:   Acute kidney injury - b/l creat 1.0 from jan 2023. Creat here was 1.6 on admission in setting of pneumonia, hypotension and septic shock. Creat peaked at 1.7 and then returned to 1.0 on 7/12. Renal failure then worsened again up to 3.84 and CRRT started 7/19. AKI felt to be related to hypotension and IV contrast +/- diuretics. Had a two day CRRT holiday which showed min UOP.   - now on MWF IHD  next today now via [facility]   - tunneled HD cath done 8/7  - OK for heparin- no evidence of HIT, in vitro hemolysis Sepsis/ shock/ hypotension - initial dx on admission was pna and septic shock, now resolved.  Acute hypoxic respiratory failure - due to COPD/ pna, on vent w/ trach per CCM. F/u CXR 7/23 was negative for edema. Attempting to wean vent Volume excess/ lower extremity edema-  much improved-  soft BP limits UF some Anemia - Hb 7- 8 range, tranfuse prn-   blood given 8/6. Have added ESA  Anxiety/ depression Dispo-  possibly planning for LTAC Hyperkalemia-  had been an issue  -   better after HD  Subjective:    Seen in room.  No major issues overnight-  no UOP recorded -  due for HD today    Objective:   BP 119/76   Pulse 86   Temp 97.7 F (36.5 C) (Oral)   Resp 14   Ht 5\' 5"  (1.651 m)   Wt 70 kg   SpO2 99%   BMI 25.68 kg/m   Intake/Output Summary (Last 24 hours) at 04/25/2023 0735 Last data filed at 04/25/2023 1914 Gross per 24 hour  Intake 1249.97 ml  Output 465 ml  Net 784.97 ml   Weight change:   Physical Exam: Gen:ill-appearing but awake-   understands what I am saying to her CVS: RRR Resp: + trach, coarse mech bilaterally Abd: + soft Ext: min edema, UE greater than LE, improving ACCESS: R internal jugular tunneled HD Cath  Imaging: IR Fluoro Guide CV Line Right  Result Date: 04/23/2023 INDICATION: 62 year old female with history of hypoxic respiratory and renal failure requiring tunneled central venous  access for hemodialysis. EXAM: TUNNELED CENTRAL VENOUS HEMODIALYSIS CATHETER PLACEMENT WITH ULTRASOUND AND FLUOROSCOPIC GUIDANCE MEDICATIONS: Ancef 2 gm IV . The antibiotic was given in an appropriate time interval prior to skin puncture. ANESTHESIA/SEDATION: Moderate (conscious) sedation was employed during this procedure. A total of Versed 3 mg and Fentanyl 150 mcg was administered intravenously. Moderate Sedation Time: 27 minutes. The patient's level of consciousness and vital signs were monitored continuously by radiology nursing throughout the procedure under my direct supervision. FLUOROSCOPY TIME:  0.6 mGy COMPLICATIONS: None immediate. PROCEDURE: Informed written consent was obtained from the patient after a discussion of the risks, benefits, and alternatives to treatment. Questions regarding the procedure were encouraged and answered. The right neck and chest were prepped with chlorhexidine in a sterile fashion, and a sterile drape was applied covering the operative field. Maximum barrier sterile technique with sterile gowns and gloves were used for the procedure. A timeout was performed prior to the initiation of the procedure. After creating a small venotomy incision, a 21 gauge micropuncture kit was utilized to access the internal jugular vein. Real-time ultrasound guidance was utilized for vascular access including the acquisition of a permanent ultrasound image documenting patency of the accessed vessel. A Rosen wire was advanced to the level of the IVC and  the micropuncture sheath was exchanged for an 8 Fr dilator. A 14.5 French tunneled hemodialysis catheter measuring 19 cm from tip to cuff was tunneled in a retrograde fashion from the anterior chest wall to the venotomy incision. Serial dilation was then performed an a peel-away sheath was placed. The catheter was then placed through the peel-away sheath with the catheter tip ultimately positioned within the right atrium. Final catheter positioning  was confirmed and documented with a spot radiographic image. The catheter aspirates and flushes normally. The catheter was flushed with appropriate volume heparin dwells. The catheter exit site was secured with a 0-Silk retention suture. The venotomy incision was closed with Dermabond. Sterile dressings were applied. The patient tolerated the procedure well without immediate post procedural complication. IMPRESSION: Successful placement of 19 cm tip to cuff tunneled hemodialysis catheter via the right internal jugular vein with catheter tip terminating within the right atrium. The catheter is ready for immediate use. Marliss Coots, MD [facility] Radiology Electronically Signed   By: Marliss Coots M.D.   On: 04/23/2023 15:52   IR US Guide Vasc Access Right  Result Date: 04/23/2023 INDICATION: 62 year old female with history of hypoxic respiratory and renal failure requiring tunneled central venous access for hemodialysis. EXAM: TUNNELED CENTRAL VENOUS HEMODIALYSIS CATHETER PLACEMENT WITH ULTRASOUND AND FLUOROSCOPIC GUIDANCE MEDICATIONS: Ancef 2 gm IV . The antibiotic was given in an appropriate time interval prior to skin puncture. ANESTHESIA/SEDATION: Moderate (conscious) sedation was employed during this procedure. A total of Versed 3 mg and Fentanyl 150 mcg was administered intravenously. Moderate Sedation Time: 27 minutes. The patient's level of consciousness and vital signs were monitored continuously by radiology nursing throughout the procedure under my direct supervision. FLUOROSCOPY TIME:  0.6 mGy COMPLICATIONS: None immediate. PROCEDURE: Informed written consent was obtained from the patient after a discussion of the risks, benefits, and alternatives to treatment. Questions regarding the procedure were encouraged and answered. The right neck and chest were prepped with chlorhexidine in a sterile fashion, and a sterile drape was applied covering the  operative field. Maximum barrier sterile technique with sterile gowns and gloves were used for the procedure. A timeout was performed prior to the initiation of the procedure. After creating a small venotomy incision, a 21 gauge micropuncture kit was utilized to access the internal jugular vein. Real-time ultrasound guidance was utilized for vascular access including the acquisition of a permanent ultrasound image documenting patency of the accessed vessel. A Rosen wire was advanced to the level of the IVC and the micropuncture sheath was exchanged for an 8 Fr dilator. A 14.5 French tunneled hemodialysis catheter measuring 19 cm from tip to cuff was tunneled in a retrograde fashion from the anterior chest wall to the venotomy incision. Serial dilation was then performed an a peel-away sheath was placed. The catheter was then placed through the peel-away sheath with the catheter tip ultimately positioned within the right atrium. Final catheter positioning was confirmed and documented with a spot radiographic image. The catheter aspirates and flushes normally. The catheter was flushed with appropriate volume heparin dwells. The catheter exit site was secured with a 0-Silk retention suture. The venotomy incision was closed with Dermabond. Sterile dressings were applied. The patient tolerated the procedure well without immediate post procedural complication. IMPRESSION: Successful placement of 19 cm tip to cuff tunneled hemodialysis catheter via the right internal jugular vein with catheter tip terminating within the right atrium. The catheter is ready for immediate use. Marliss Coots, MD Vascular and Interventional  Radiology Specialists [facility] Radiology Electronically Signed   By: Marliss Coots M.D.   On: 04/23/2023 15:52   IR GASTROSTOMY TUBE MOD SED  Result Date: 04/23/2023 INDICATION: 62 year old female with history of hypoxic respiratory failure status post tracheostomy presenting for percutaneous gastrostomy  tube placement. EXAM: PERC PLACEMENT GASTROSTOMY MEDICATIONS: Ancef 2 gm IV; Antibiotics were administered within 1 hour of the procedure. ANESTHESIA/SEDATION: Versed 3 mg IV; Fentanyl 150 mcg IV Moderate Sedation Time:  27 The patient was continuously monitored during the procedure by the interventional radiology nurse under my direct supervision. CONTRAST:  15mL OMNIPAQUE IOHEXOL 300 MG/ML SOLN - administered into the gastric lumen. FLUOROSCOPY TIME:  Two mGy COMPLICATIONS: None immediate. PROCEDURE: Informed written consent was obtained from the patient after a thorough discussion of the procedural risks, benefits and alternatives. All questions were addressed. Maximal Sterile barrier Technique was utilized including caps, mask, sterile gowns, sterile gloves, sterile drape, hand hygiene and skin antiseptic. A timeout was performed prior to the initiation of the procedure. The patient was placed on the procedure table in the supine position. Pre-procedure abdominal film confirmed visualization of the transverse colon. The patient was prepped and draped in usual sterile fashion. The stomach was insufflated with air via the indwelling nasogastric tube. Under fluoroscopy, a puncture site was selected and local analgesia achieved with 1% lidocaine infiltrated subcutaneously. Under fluoroscopic guidance, a gastropexy needle was passed into the stomach and the T-bar suture was released. Entry into the stomach was confirmed with fluoroscopy, aspiration of air, and injection of contrast material. This was repeated with an additional gastropexy suture (for a total of 2 fasteners). At the center of these gastropexy sutures, a dermatotomy was performed. An 18 gauge needle was passed into the stomach at the site of this dermatotomy, and position within the gastric lumen again confirmed under fluoroscopy using aspiration of air and contrast injection. An Amplatz guidewire was passed through this needle and intraluminal  placement within the stomach was confirmed by fluoroscopy. The needle was removed. Over the guidewire, the percutaneous tract was dilated using a 10 mm non-compliant balloon. The balloon was deflated, then pushed into the gastric lumen followed in concert by the 20 Fr gastrostomy tube. The retention balloon of the percutaneous gastrostomy tube was inflated with 20 mL of sterile water. The tube was withdrawn until the retention balloon was at the edge of the gastric lumen. The external bumper was brought to the abdominal wall. Contrast was injected through the gastrostomy tube, confirming intraluminal positioning. The patient tolerated the procedure well without any immediate post-procedural complications. IMPRESSION: Technically successful placement of 20 Fr gastrostomy tube. Marliss Coots, MD [facility] Radiology Electronically Signed   By: Marliss Coots M.D.   On: 04/23/2023 15:51    Labs: BMET Recent Labs  Lab 04/18/23 1232 04/19/23 0865 04/20/23 0022 04/21/23 0131 04/22/23 0056 04/23/23 0354 04/24/23 0136 04/24/23 0700  NA 137 136 135 135 133* 133* 132*  --   K 4.3 4.4 4.5 5.7* 5.2* 6.0* 3.9  --   CL 98 91* 94* 94* 93* 95* 92*  --   CO2 23 28 27 26 26 23 26   --   GLUCOSE 133* 160* 148* 139* 141* 110* 119*  --   BUN 110* 58* 87* 137* 78* 128* 65*  --   CREATININE 6.24* 3.38* 4.38* 5.96* 3.62* 5.01* 3.63*  --   CALCIUM 9.6 9.1 8.7* 8.5* 8.1* 8.5* 8.6*  --   PHOS  --   --   --   --   --   --   --  5.1*   CBC Recent Labs  Lab 04/22/23 1208 04/23/23 0354 04/25/23 0153 04/25/23 0634  WBC 14.0* 11.9* 12.7* 12.7*  HGB 7.9* 7.6* 9.1* 9.3*  HCT 25.5* 23.9* 29.2* 29.2*  MCV 93.4 94.1 94.5 95.1  PLT 209 195 222 224    Medications:     arformoterol  15 mcg Nebulization BID   atorvastatin  20 mg Per Tube Daily   budesonide (PULMICORT) nebulizer solution  0.5 mg Nebulization BID   Chlorhexidine Gluconate Cloth  6 each Topical Q0600    Chlorhexidine Gluconate Cloth  6 each Topical Q0600   Chlorhexidine Gluconate Cloth  6 each Topical Q0600   clonazePAM  0.5 mg Per Tube BID   darbepoetin (ARANESP) injection - DIALYSIS  150 mcg Subcutaneous Q Mon-1800   escitalopram  10 mg Per Tube Daily   fiber  1 packet Per Tube BID   gabapentin  200 mg Per Tube QHS   Gerhardt's butt cream   Topical BID   heparin injection (subcutaneous)  5,000 Units Subcutaneous Q8H   insulin aspart  0-9 Units Subcutaneous Q4H   multivitamin  1 tablet Per Tube QHS   mouth rinse  15 mL Mouth Rinse Q2H   pantoprazole (PROTONIX) IV  40 mg Intravenous Q24H   revefenacin  175 mcg Nebulization Daily   saccharomyces boulardii  250 mg Per Tube BID   sodium chloride flush  10-40 mL Intracatheter Q12H   sodium chloride flush  3 mL Intravenous Q12H    A   04/25/2023, 7:35 AM

## 2023-04-25 NOTE — Progress Notes (Addendum)
NAME:  Tamara Castro, MRN:  [id number], DOB:  1961-05-17, LOS: 24 ADMISSION DATE:  04/01/2023  History of Present Illness:  62 year old with COPD, chronic constipation, colectomy in 1990s due to diverticulitis, fibromyalgia on disability since 2008, depression and anxiety, neuropathy.  Presented to [facility] on 03/21/2023 after a week of URI symptoms and was admitted for acute hypoxic and hypercapnic respiratory failure due to right lower lobe pneumonia.  She required intubation and pressor support for septic shock.  She had an AKI and acute heart failure with preserved LVEF and reduced RV EF.  PE ruled out by CT at that time.  She initially improved with antibiotics for CAP coverage and was extubated but then reintubated after clinically worsening.  Hospital course has been complicated by acute on chronic ileus, progression to renal failure, now on HD, prolonged mechanical ventilation requiring tracheostomy, and gastrostomy tube dependence.  Significant Hospital Events:  7/5 admitted [facility] CTX and azith started 7/7 extubated. Vanc added and cefepime started; CTX stopped 7/8 vanc stopped 7/10 reintubated  7/16 tx to [facility]. Cefepime stopped 7/17 attempting lasix. Creatinine worse.  Echo EF 60-65%no WMA RV nml  7/18 cr worse after lasix. Weaning but on-going ileus and cr rising presenting obstacle to extubation Trying neostigmine.  7/19 scr worse in spite of holding lasix. Still on low dose precedex. Mental status and renal fxn still barrier to extubation. Nephro consulted > started CRRT.  HD catheter placed. 7/20 [place] heparin stopped d/t bleeding around HD cath. Got DDAVP.  7/21 resumes cefepime for worsening leukocytosis.  7/22 volumes ok w/ SBT but has sig accessory use  7/23- Trach performed at bedside. CRRT holiday per nephrology. Increased Xanax to home dose 1mg  QID- added seroquel 25mg  BID 7/24- has successfully weaned off propofol and converted to precedex infusion. Tolerating 4+  hours of SBT 7/25- discontinued precedex and fentanyl with as needed fentanyl pushes for pain. Tolerated SBT for 3 hours before failing and being placed on full vent support. Oliguric with rising serum creatinine- nephrology requests transfer to [facility] for restarting dialysis  7/26 multiple emesis, NG to LIS, EEG neg , HD session 7/27 DC Seroquel, decrease Xanax 0.5 twice daily 7/28 Code stroke called for unequal pupils this a.m., head CT negative, seen by neurology; MRI neg 7/31 Hgb drop to 5.5, 2 u prbc, DIC panel Lower extremity Dopplers 8/3 >>  CT abdomen 8/3 >> near complete resolution of pleural effusions, there is a safe window for PEG tube placement 04/23/2023 IR placed tunneled right IJ HD catheter and percutaneous gastrostomy tube  Interim History / Subjective:   Hydroxyzine prescribed for itching Did well on trach collar overnight Afebrile  Objective   Blood pressure 119/76, pulse 87, temperature 97.7 F (36.5 C), temperature source Oral, resp. rate 16, height 5\' 5"  (1.651 m), weight 70 kg, SpO2 98%.    Vent Mode: PSV;CPAP FiO2 (%):  [30 %-40 %] 35 % PEEP:  [5 cmH20] 5 cmH20 Pressure Support:  [5 cmH20] 5 cmH20   Intake/Output Summary (Last 24 hours) at 04/25/2023 0711 Last data filed at 04/25/2023 0640 Gross per 24 hour  Intake 1249.97 ml  Output 465 ml  Net 784.97 ml   Filed Weights   04/23/23 0500 04/23/23 0906 04/23/23 1214  Weight: 72.3 kg 72.3 kg 70 kg    Examination: No distress Tracheostomy collar with oxygen mask Heart rates normal, rhythm is regular, radial pulses strong Breathing is regular and unlabored, diminished breath sounds bilaterally Skin is warm and  dry Alert, answers questions, follows instructions  Labs: Sodium 133 Potassium 4.2 Bicarb 25 Phosphorus 6.2 Magnesium 2.9 WBC 12.7 and stable Hemoglobin 9.3 stable Platelets 224 stable  Resolved Hospital Problem list   Resolved Problems:   CAP (community acquired pneumonia)   Septic  shock (HCC)   COPD with acute exacerbation (HCC)   Acute heart failure with preserved ejection fraction (HCC)   Elevated troponin   Metabolic acidosis   Assessment & Plan:  Principal Problem:   Acute hypoxemic respiratory failure (HCC) Active Problems:   Aspiration pneumonia (HCC)   Pressure injury of skin   Ileus (HCC)   Volume overload   AKI (acute kidney injury) (HCC)   COPD (chronic obstructive pulmonary disease) (HCC)   Tobacco abuse   (HFpEF) heart failure with preserved ejection fraction (HCC)   Leukocytosis   CAD (coronary artery disease)   Anemia   Thrombocytopenia (HCC)   Acute encephalopathy   Depression   Anxiety   Tracheostomy status (HCC)  Prolonged mechanical ventilation requiring tracheostomy Community-acquired pneumonia, resolved History of COPD - Pulmonary hygiene - Stable off vent for >24 hours now - Wean oxygen for saturation >88% - Nebulized LAMA/LABA/ICS - SLP for Passy-Muir and speech/swallow therapy  Anemia of critical illness - Persistent hemoglobin drifted downward despite no obvious sources of bleeding or hemolysis - Low suspicion for DIC - Suspect frequent phlebotomy might be part of the problem, back off on lab draws - Transfuse for hemoglobin <7 g/dL - She is iron replete  History of anxiety - Gabapentin 200 mg nightly - Clonazepam 0.5 mg twice daily  Acute tubular necrosis, now on intermittent HD - Appreciate nephrology management - Tunneled HD cath with IR planned for today  Ileus, resolved - Continue tube feeds via gastrostomy tube  Nutrition Status: Nutrition Problem: Inadequate oral intake Etiology: inability to eat Signs/Symptoms: NPO status Interventions: Refer to RD note for recommendations  In vitro hemolysis - Drawn necessary labs from peripheral sites  Stable for transition to LTAC  Best practice (daily eval):  Diet/type: tubefeeds DVT prophylaxis: LMWH GI prophylaxis: PPI Lines: Dialysis Catheter Foley:   N/A Code Status:  DNR  Marrianne Mood MD 04/25/2023, 7:11 AM  Pager: [phone]

## 2023-04-25 NOTE — Progress Notes (Signed)
RT called to room because of patient desating to the 70's. Patient placed back on vent for HD treatment. Vitals stable on this time.

## 2023-04-25 NOTE — Evaluation (Signed)
Passy-Muir Speaking Valve - Evaluation Patient Details  Name: Tamara Castro MRN: [id number] Date of Birth: 1960/11/18  Today's Date: 04/25/2023 Time: 1200-1220 SLP Time Calculation (min) (ACUTE ONLY): 20 min  Past Medical History:  Past Medical History:  Diagnosis Date   Anxiety    Asthma    Bladder polyps    per pt, urethral polyps   COPD (chronic obstructive pulmonary disease) (HCC)    Dysplasia of cervix    Hyperlipidemia    Osteoarthritis    Past Surgical History:  Past Surgical History:  Procedure Laterality Date   ABDOMINAL HYSTERECTOMY     CHOLECYSTECTOMY     IR FLUORO GUIDE CV LINE RIGHT  04/23/2023   IR GASTROSTOMY TUBE MOD SED  04/23/2023   IR US GUIDE VASC ACCESS RIGHT  04/23/2023   TONSILLECTOMY Bilateral    TOTAL COLECTOMY  19962   HPI:  62 year old with COPD, chronic constipation, colectomy in 1990s due to diverticulitis, fibromyalgia on disability since 2008, depression and anxiety, neuropathy. Presented to [facility] on 03/21/2023 after a week of URI symptoms and was admitted for acute hypoxic and hypercapnic respiratory failure due to right lower lobe pneumonia.  She required intubation and pressor support for septic shock.  She had an AKI and acute heart failure with preserved LVEF and reduced RV EF.  PE ruled out by CT at that time.  She initially improved with antibiotics for CAP coverage and was extubated but then reintubated after clinically worsening.  Hospital course has been complicated by acute on chronic ileus, progression to renal failure, now on HD, prolonged mechanical ventilation requiring tracheostomy, and gastrostomy tube dependence. Intubated 7/5-7/7; 7/10-7/23; trach collar 8/8.    Assessment / Plan / Recommendation  Clinical Impression  Pt demonstrates poor tolerance of PMSV. At baseline pts O2 sat was 89 with target above 88 and alarm at 84. SLP deflated cuff with no secretion mobilization. PMSV placed resulting in soft coughing which  fatigued pt and lead to desat to 81 but no back pressure noted; successful redirection of air to upper airway. PMSV taken off for 5 minutes to let pt recover; pt returned to 84 spO2 and SLP eventually replaced valve though asked pt to sit quietly with valve on without effort. Pts saturations stayed between 83 and 84. She did eventually utter a few few short phrases about changing the channel which elicited shoft intellgible phonation. However this activity again led to slight desaturation. Pt does not appear to have respiratory endurance to tolerate extra work of PMSV use today. Will f/u for further trials. Pt may wear pmsv briefly with staff to communicate wants/needs, but must remove within one minute. Must check for cuff deflation.      SLP Assessment       Recommendations for follow up therapy are one component of a multi-disciplinary discharge planning process, led by the attending physician.  Recommendations may be updated based on patient status, additional functional criteria and insurance authorization.  Follow Up Recommendations  Long-term institutional care without follow-up therapy    Assistance Recommended at Discharge    Functional Status Assessment    Frequency and Duration min 2x/week  2 weeks    PMSV Trial PMSV was placed for: 10 minutes Able to redirect subglottic air through upper airway: Yes Able to Attain Phonation: Yes Voice Quality: Low vocal intensity;Breathy Able to Expectorate Secretions: No attempts Breath Support for Phonation: Severely decreased Intelligibility: Intelligibility reduced Word: 25-49% accurate Phrase: 25-49% accurate Sentence: 25-49% accurate Conversation: 25-49% accurate  Respirations During Trial: 25 SpO2 During Trial: (!) 84 %   Tracheostomy Tube  Additional Tracheostomy Tube Assessment Trach Collar Period: 24 hours Secretion Description: none    Vent Dependency  Vent Dependent: No FiO2 (%): 35 %    Cuff Deflation Trial Tolerated  Cuff Deflation: Yes         , Riley Nearing 04/25/2023, 2:11 PM

## 2023-04-25 NOTE — Progress Notes (Addendum)
Noted to be hypoxic on the monitor with saturations ~80% and good waveform. At time of bedside assessment she is in no distress, no increased work of breathing noted. She has paroxysms of cough. Her lung sounds are diminished throughout without wheezing. Stat CXR looks stable from prior study with diffuse interstitial opacities but no focal consolidations, pneumothorax, or significant pulmonary edema. I suctioned her tracheostomy and put her back on ventilator as she was starting to have some increased work of breathing with tachypnea and intercostal retractions. PRVC 24/450/8/60%.  Marrianne Mood MD 04/25/2023, 1:35 PM

## 2023-04-25 NOTE — Progress Notes (Signed)
eLink Physician-Brief Progress Note Patient Name: Tamara Castro DOB: 1961/04/10 MRN: [id number]   Date of Service  04/25/2023  HPI/Events of Note  Ongoing Itching  eICU Interventions  Hydroxyzine PRN     Intervention Category Minor Interventions: Routine modifications to care plan (e.g. PRN medications for pain, fever)    04/25/2023, 1:34 AM

## 2023-04-26 DIAGNOSIS — J9621 Acute and chronic respiratory failure with hypoxia: Secondary | ICD-10-CM

## 2023-04-26 LAB — GLUCOSE, CAPILLARY
Glucose-Capillary: 116 mg/dL — ABNORMAL HIGH (ref 70–99)
Glucose-Capillary: 140 mg/dL — ABNORMAL HIGH (ref 70–99)
Glucose-Capillary: 106 mg/dL — ABNORMAL HIGH (ref 70–99)
Glucose-Capillary: 123 mg/dL — ABNORMAL HIGH (ref 70–99)
Glucose-Capillary: 114 mg/dL — ABNORMAL HIGH (ref 70–99)
Glucose-Capillary: 108 mg/dL — ABNORMAL HIGH (ref 70–99)

## 2023-04-26 NOTE — Progress Notes (Signed)
   04/26/23 1600  Therapy Vitals  Pulse Rate 94  Patient Position (if appropriate) Lying  MEWS Score/Color  MEWS Score 1  MEWS Score Color Green  Respiratory Assessment  Assessment Type Assess only  Respiratory Pattern Regular;Unlabored  Chest Assessment Chest expansion symmetrical  Cough Strong  Sputum Amount Small  Sputum Color White  Sputum Consistency Thick  Sputum Specimen Source Tracheostomy tube  Bilateral Breath Sounds Clear  Oxygen Therapy/Pulse Ox  O2 Device (S)  Tracheostomy Collar  O2 Therapy Oxygen humidified  O2 Flow Rate (L/min) (S)  8 L/min  FiO2 (%) (S)  35 %  Tracheostomy Shiley Legacy 6 mm Cuffed  Placement Date/Time: 04/08/23 1445   Inserted prior to hospital arrival?: Other (Comment)  Placed By: ICU physician  Brand: Shiley Legacy  Size (mm): 6 mm  Style: Cuffed  Status Secured with trach ties  Site Assessment Clean;Dry  Site Care Cleansed;Dried;Dressing applied  Inner Cannula Care Changed/new  Ties Assessment Clean, Dry  Cuff Pressure (cm H2O)  (deflated)  Tracheostomy Equipment at bedside Yes and checklist posted at head of bed

## 2023-04-26 NOTE — Progress Notes (Signed)
NAME:  Tamara Castro, MRN:  [id number], DOB:  1961/09/07, LOS: 25 ADMISSION DATE:  04/01/2023  History of Present Illness:  62 year old with COPD, chronic constipation, colectomy in 1990s due to diverticulitis, fibromyalgia on disability since 2008, depression and anxiety, neuropathy.  Presented to [facility] on 03/21/2023 after a week of URI symptoms and was admitted for acute hypoxic and hypercapnic respiratory failure due to right lower lobe pneumonia.  She required intubation and pressor support for septic shock.  She had an AKI and acute heart failure with preserved LVEF and reduced RV EF.  PE ruled out by CT at that time.  She initially improved with antibiotics for CAP coverage and was extubated but then reintubated after clinically worsening.  Hospital course has been complicated by acute on chronic ileus, progression to renal failure, now on HD, prolonged mechanical ventilation requiring tracheostomy, and gastrostomy tube dependence.  Significant Hospital Events:  7/5 admitted [facility] CTX and azith started 7/7 extubated. Vanc added and cefepime started; CTX stopped 7/8 vanc stopped 7/10 reintubated  7/16 tx to [facility]. Cefepime stopped 7/17 attempting lasix. Creatinine worse.  Echo EF 60-65%no WMA RV nml  7/18 cr worse after lasix. Weaning but on-going ileus and cr rising presenting obstacle to extubation Trying neostigmine.  7/19 scr worse in spite of holding lasix. Still on low dose precedex. Mental status and renal fxn still barrier to extubation. Nephro consulted > started CRRT.  HD catheter placed. 7/20 [place] heparin stopped d/t bleeding around HD cath. Got DDAVP.  7/21 resumes cefepime for worsening leukocytosis.  7/22 volumes ok w/ SBT but has sig accessory use  7/23- Trach performed at bedside. CRRT holiday per nephrology. Increased Xanax to home dose 1mg  QID- added seroquel 25mg  BID 7/24- has successfully weaned off propofol and converted to precedex infusion. Tolerating 4+  hours of SBT 7/25- discontinued precedex and fentanyl with as needed fentanyl pushes for pain. Tolerated SBT for 3 hours before failing and being placed on full vent support. Oliguric with rising serum creatinine- nephrology requests transfer to [facility] for restarting dialysis  7/26 multiple emesis, NG to LIS, EEG neg , HD session 7/27 DC Seroquel, decrease Xanax 0.5 twice daily 7/28 Code stroke called for unequal pupils this a.m., head CT negative, seen by neurology; MRI neg 7/31 Hgb drop to 5.5, 2 u prbc, DIC panel Lower extremity Dopplers 8/3 >>  CT abdomen 8/3 >> near complete resolution of pleural effusions, there is a safe window for PEG tube placement 04/23/2023 IR placed tunneled right IJ HD catheter and percutaneous gastrostomy tube  Interim History / Subjective:   Was switched over to full support on the ventilator yesterday after developing some respiratory distress due to exhaustion from prolonged wean, work with SLP with speaking valve. Afebrile PRVC 24/450/8/50% 2.4 L out with HD yesterday  Objective   Blood pressure 127/69, pulse 81, temperature 99 F (37.2 C), temperature source Oral, resp. rate 17, height 5\' 5"  (1.651 m), weight 70 kg, SpO2 100%.    Vent Mode: PRVC FiO2 (%):  [35 %-60 %] 50 % Set Rate:  [24 bmp] 24 bmp Vt Set:  [450 mL] 450 mL PEEP:  [5 cmH20-8 cmH20] 8 cmH20 Pressure Support:  [5 cmH20] 5 cmH20 Plateau Pressure:  [19 cmH20-30 cmH20] 19 cmH20   Intake/Output Summary (Last 24 hours) at 04/26/2023 1113 Last data filed at 04/26/2023 0800 Gross per 24 hour  Intake 1050 ml  Output 2400 ml  Net -1350 ml   [employer]  04/23/23 1214 04/25/23 1345 04/25/23 1400  Weight: 70 kg 70 kg 70 kg    Examination: No apparent distress Heart rate is normal, rhythm is regular, radial pulses strong Tracheostomy tube connected to vent, ventilated at rate of 24, severely diminished bilateral breath sounds Skin is warm and dry Alert, answers questions and  follows instructions  Resolved Hospital Problem list   Resolved Problems:   CAP (community acquired pneumonia)   Septic shock (HCC)   COPD with acute exacerbation (HCC)   Acute heart failure with preserved ejection fraction (HCC)   Elevated troponin   Metabolic acidosis   Assessment & Plan:  Principal Problem:   Acute hypoxemic respiratory failure (HCC) Active Problems:   Aspiration pneumonia (HCC)   Pressure injury of skin   Ileus (HCC)   Volume overload   AKI (acute kidney injury) (HCC)   COPD (chronic obstructive pulmonary disease) (HCC)   Tobacco abuse   (HFpEF) heart failure with preserved ejection fraction (HCC)   Leukocytosis   CAD (coronary artery disease)   Anemia   Thrombocytopenia (HCC)   Acute encephalopathy   Depression   Anxiety   Tracheostomy status (HCC)  Prolonged mechanical ventilation requiring tracheostomy Community-acquired pneumonia, resolved History of COPD - Pulmonary hygiene - Trach collar as tolerated, as needed vent support - Wean oxygen for saturation >90% - Nebulized LAMA/LABA/ICS - SLP for Passy-Muir and speech/swallow therapy  Anemia of critical illness - Persistent hemoglobin drifted downward despite no obvious sources of bleeding or hemolysis - Low suspicion for DIC - Suspect frequent phlebotomy might be part of the problem, back off on lab draws - Transfuse for hemoglobin <7 g/dL - She is iron replete - Aranesp with HD  History of anxiety Neuropathy - Escitalopram 10 mg daily - Gabapentin 200 mg nightly - Clonazepam 0.5 mg twice daily  Acute tubular necrosis, now on intermittent HD - Appreciate nephrology management  Ileus, resolved - Continue tube feeds via gastrostomy tube  Nutrition Status: Nutrition Problem: Inadequate oral intake Etiology: inability to eat Signs/Symptoms: NPO status Interventions: Refer to RD note for recommendations  In vitro hemolysis - Drawn necessary labs from peripheral sites  Stable  for transition to LTAC.  Best practice (daily eval):  Diet/type: tubefeeds DVT prophylaxis: LMWH GI prophylaxis: PPI Lines: Dialysis Catheter Foley:  N/A Code Status:  DNR  Marrianne Mood MD 04/26/2023, 11:13 AM  Pager: [phone]

## 2023-04-26 NOTE — Progress Notes (Signed)
[facility] Progress Note   Assessment/ Plan:   Acute kidney injury - b/l creat 1.0 from jan 2023. Creat was 1.6 on admission in setting of pneumonia, hypotension and septic shock.  peaked at 1.7 and returned to 1.0 on 03/28/23 Renal failure then worsened again up to 3.84 and CRRT started 7/19- has been RRT dependent since. AKI felt to be related to hypotension and IV contrast +/- diuretics.   - now on MWF IHD  next today now via [facility]   - no evidence of recover as of yet-  plan for next HD on Monday 8/12   Sepsis/ shock/ hypotension - initial dx on admission was pna and septic shock, now resolved.  Acute hypoxic respiratory failure - due to COPD/ pna, on vent w/ trach per CCM. F/u CXR 7/23 was negative for edema. Attempting to wean vent Volume excess/ lower extremity edema-  much improved-  soft BP limits UF some Anemia - Hb 7- 8 range, tranfuse prn-   blood given 8/6.  added ESA-  overall improved  Anxiety/ depression Dispo-  possibly planning for LTAC Hyperkalemia-  had been an issue  -   better after HD  Subjective:    Got HD yesterday  -  had some anxiety issues during it-  desats-  ended up being put back on vent-  able to remove 2400 however   Objective:   BP 130/78   Pulse 99   Temp 98.8 F (37.1 C) (Axillary)   Resp (!) 21   Ht 5\' 5"  (1.651 m)   Wt 70 kg   SpO2 (!) 83%   BMI 25.68 kg/m   Intake/Output Summary (Last 24 hours) at 04/26/2023 1610 Last data filed at 04/26/2023 0700 Gross per 24 hour  Intake 1450 ml  Output 2640 ml  Net -1190 ml   Weight change:   Physical Exam: Gen:ill-appearing but awake-   understands what I am saying to her CVS: RRR Resp: + trach, coarse mech bilaterally Abd: + soft Ext: min edema, UE greater than LE, improving ACCESS: R internal jugular tunneled HD Cath  Imaging: DG CHEST PORT 1 VIEW  Result Date: 04/25/2023 CLINICAL DATA:  Acute hypoxic respiratory failure EXAM: PORTABLE CHEST 1 VIEW COMPARISON:  Chest  radiograph 04/21/2023 FINDINGS: The tracheostomy tube is stable. The right sided vascular catheter terminates in the mid to lower SVC. The cardiomediastinal silhouette is stable and within normal limits Coarsened interstitial markings in both lungs primarily in the lung bases and lucency in the upper lobes consistent with underlying COPD are unchanged. There is no new focal airspace opacity. There is no pulmonary edema. There is no pleural effusion or pneumothorax There is no acute osseous abnormality. IMPRESSION: Stable chest with no radiographic evidence of acute cardiopulmonary process. Electronically Signed   By: Lesia Hausen M.D.   On: 04/25/2023 14:35    Labs: BMET Recent Labs  Lab 04/19/23 0832 04/20/23 0022 04/21/23 0131 04/22/23 0056 04/23/23 0354 04/24/23 0136 04/24/23 0700 04/25/23 0645  NA 136 135 135 133* 133* 132*  --  133*  K 4.4 4.5 5.7* 5.2* 6.0* 3.9  --  4.2  CL 91* 94* 94* 93* 95* 92*  --  91*  CO2 28 27 26 26 23 26   --  25  GLUCOSE 160* 148* 139* 141* 110* 119*  --  131*  BUN 58* 87* 137* 78* 128* 65*  --  101*  CREATININE 3.38* 4.38* 5.96* 3.62* 5.01* 3.63*  --  5.24*  CALCIUM  9.1 8.7* 8.5* 8.1* 8.5* 8.6*  --  8.8*  PHOS  --   --   --   --   --   --  5.1* 6.2*   CBC Recent Labs  Lab 04/22/23 1208 04/23/23 0354 04/25/23 0153 04/25/23 0634  WBC 14.0* 11.9* 12.7* 12.7*  HGB 7.9* 7.6* 9.1* 9.3*  HCT 25.5* 23.9* 29.2* 29.2*  MCV 93.4 94.1 94.5 95.1  PLT 209 195 222 224    Medications:     arformoterol  15 mcg Nebulization BID   atorvastatin  20 mg Per Tube Daily   budesonide (PULMICORT) nebulizer solution  0.5 mg Nebulization BID   Chlorhexidine Gluconate Cloth  6 each Topical Q0600   clonazePAM  0.5 mg Per Tube BID   darbepoetin (ARANESP) injection - DIALYSIS  150 mcg Subcutaneous Q Mon-1800   escitalopram  10 mg Per Tube Daily   fiber  1 packet Per Tube TID   gabapentin  200 mg Per Tube QHS   Gerhardt's butt cream   Topical BID   heparin injection  (subcutaneous)  5,000 Units Subcutaneous Q8H   insulin aspart  0-9 Units Subcutaneous Q4H   multivitamin  1 tablet Per Tube QHS   mouth rinse  15 mL Mouth Rinse Q2H   pantoprazole (PROTONIX) IV  40 mg Intravenous Q24H   revefenacin  175 mcg Nebulization Daily   saccharomyces boulardii  250 mg Per Tube BID   sodium chloride flush  10-40 mL Intracatheter Q12H   sodium chloride flush  3 mL Intravenous Q12H    A   04/26/2023, 7:23 AM

## 2023-04-27 DIAGNOSIS — J9601 Acute respiratory failure with hypoxia: Secondary | ICD-10-CM | POA: Diagnosis not present

## 2023-04-27 LAB — GLUCOSE, CAPILLARY: Glucose-Capillary: 129 mg/dL — ABNORMAL HIGH (ref 70–99)

## 2023-04-27 MED ORDER — CHLORHEXIDINE GLUCONATE CLOTH 2 % EX PADS: 6.0000 | MEDICATED_PAD | Freq: Every day | CUTANEOUS | Status: DC

## 2023-04-27 MED ORDER — RAMELTEON 8 MG PO TABS
8.0000 mg | ORAL_TABLET | Freq: Every day | ORAL | Status: DC
  Administered 2023-04-27: 8 mg
  Filled 2023-04-27 (×2): qty 1

## 2023-04-27 NOTE — Progress Notes (Signed)
Pt placed back on ventilator due to increase WOB and tachypnea. Pt tolerating ventilator well. No complications at this time.

## 2023-04-27 NOTE — Plan of Care (Signed)
  Problem: Education: Goal: Knowledge of General Education information will improve Description: Including pain rating scale, medication(s)/side effects and non-pharmacologic comfort measures Outcome: Progressing   Problem: Health Behavior/Discharge Planning: Goal: Ability to manage health-related needs will improve Outcome: Progressing   Problem: Clinical Measurements: Goal: Ability to maintain clinical measurements within normal limits will improve Outcome: Progressing Goal: Will remain free from infection Outcome: Progressing Goal: Diagnostic test results will improve Outcome: Progressing Goal: Respiratory complications will improve Outcome: Progressing Goal: Cardiovascular complication will be avoided Outcome: Progressing   Problem: Activity: Goal: Risk for activity intolerance will decrease Outcome: Progressing   Problem: Nutrition: Goal: Adequate nutrition will be maintained Outcome: Progressing   Problem: Coping: Goal: Level of anxiety will decrease Outcome: Progressing   Problem: Elimination: Goal: Will not experience complications related to bowel motility Outcome: Progressing Goal: Will not experience complications related to urinary retention Outcome: Progressing   Problem: Pain Managment: Goal: General experience of comfort will improve Outcome: Progressing   Problem: Safety: Goal: Ability to remain free from injury will improve Outcome: Progressing   Problem: Skin Integrity: Goal: Risk for impaired skin integrity will decrease Outcome: Progressing   Problem: Activity: Goal: Ability to tolerate increased activity will improve Outcome: Progressing   Problem: Respiratory: Goal: Ability to maintain a clear airway and adequate ventilation will improve Outcome: Progressing   Problem: Role Relationship: Goal: Method of communication will improve Outcome: Progressing   Problem: Education: Goal: Knowledge about tracheostomy care/management will  improve Outcome: Progressing   Problem: Activity: Goal: Ability to tolerate increased activity will improve Outcome: Progressing   Problem: Health Behavior/Discharge Planning: Goal: Ability to manage tracheostomy will improve Outcome: Progressing   Problem: Respiratory: Goal: Patent airway maintenance will improve Outcome: Progressing   Problem: Role Relationship: Goal: Ability to communicate will improve Outcome: Progressing   Problem: Education: Goal: Ability to describe self-care measures that may prevent or decrease complications (Diabetes Survival Skills Education) will improve Outcome: Progressing Goal: Individualized Educational Video(s) Outcome: Progressing   Problem: Coping: Goal: Ability to adjust to condition or change in health will improve Outcome: Progressing   Problem: Fluid Volume: Goal: Ability to maintain a balanced intake and output will improve Outcome: Progressing   Problem: Health Behavior/Discharge Planning: Goal: Ability to identify and utilize available resources and services will improve Outcome: Progressing Goal: Ability to manage health-related needs will improve Outcome: Progressing   Problem: Metabolic: Goal: Ability to maintain appropriate glucose levels will improve Outcome: Progressing   Problem: Nutritional: Goal: Maintenance of adequate nutrition will improve Outcome: Progressing Goal: Progress toward achieving an optimal weight will improve Outcome: Progressing   Problem: Skin Integrity: Goal: Risk for impaired skin integrity will decrease Outcome: Progressing   Problem: Tissue Perfusion: Goal: Adequacy of tissue perfusion will improve Outcome: Progressing   Problem: Safety: Goal: Non-violent Restraint(s) Outcome: Progressing

## 2023-04-27 NOTE — Progress Notes (Signed)
   04/27/23 0835  Therapy Vitals  Pulse Rate 94  Resp (!) 22  Patient Position (if appropriate) Lying  Respiratory Assessment  Assessment Type Assess only  Respiratory Pattern Regular;Unlabored;Symmetrical  Chest Assessment Chest expansion symmetrical  Cough Strong;Productive  Sputum Amount Small  Sputum Color White  Sputum Consistency Thin  Sputum Specimen Source Tracheostomy tube  Bilateral Breath Sounds Clear;Diminished  Oxygen Therapy/Pulse Ox  O2 Device (S)  Tracheostomy Collar  O2 Therapy Oxygen humidified  O2 Flow Rate (L/min) (S)  6 L/min  FiO2 (%) (S)  28 %  SpO2 95 %  Tracheostomy Shiley Legacy 6 mm Cuffed  Placement Date/Time: 04/08/23 1445   Inserted prior to hospital arrival?: Other (Comment)  Placed By: ICU physician  Brand: Shiley Legacy  Size (mm): 6 mm  Style: Cuffed  Status Secured with trach ties  Site Assessment Clean;Dry  Site Care Cleansed;Dried;Open to air  Ties Assessment Clean, Dry  Cuff Pressure (cm H2O)  (deflated)  Tracheostomy Equipment at bedside Yes and checklist posted at head of bed   Patient taken off the vent at this time and placed on aerosol trach collar. Pt able to communicate with this RT for 5 minutes with PMV on without complication.

## 2023-04-27 NOTE — Plan of Care (Signed)
  Problem: Clinical Measurements: Goal: Respiratory complications will improve Outcome: Progressing   Problem: Nutrition: Goal: Adequate nutrition will be maintained Outcome: Progressing   Problem: Role Relationship: Goal: Method of communication will improve Outcome: Progressing

## 2023-04-27 NOTE — Progress Notes (Signed)
[facility] Progress Note   Assessment/ Plan:   Acute kidney injury - b/l creat 1.0 from jan 2023. Creat was 1.6 on admission in setting of pneumonia, hypotension and septic shock.  peaked at 1.7 and returned to 1.0 on 03/28/23 Renal failure then worsened again up to 3.84 and CRRT started 7/19- has been RRT dependent since. AKI felt to be related to hypotension and IV contrast +/- diuretics.   - now on MWF IHD  next Monday now via [facility]   - no evidence of recover as of yet-  plan for next HD on Monday 8/12   Sepsis/ shock/ hypotension - initial dx on admission was pna and septic shock, now resolved.  Acute hypoxic respiratory failure - due to COPD/ pna, on vent w/ trach per CCM. F/u CXR 7/23 was negative for edema. Attempting to wean vent Volume excess/ lower extremity edema-  much improved-  soft BP limits UF some Anemia - Hb 7- 8 range, tranfuse prn-   blood given 8/6.  added ESA-  overall improved  Anxiety/ depression Dispo-  possibly planning for LTAC Hyperkalemia-  had been an issue  -   better after HD  Subjective:     Continues to have some anxiety issues -  trying to mouth words-  cannot understand-  no UOP, crt rising off of HD   Objective:   BP 139/79   Pulse 96   Temp 98.8 F (37.1 C) (Oral)   Resp (!) 23   Ht 5\' 5"  (1.651 m)   Wt 72.9 kg   SpO2 100%   BMI 26.74 kg/m   Intake/Output Summary (Last 24 hours) at 04/27/2023 0730 Last data filed at 04/27/2023 0700 Gross per 24 hour  Intake 1580 ml  Output 700 ml  Net 880 ml   Weight change: 2.9 kg  Physical Exam: Gen:ill-appearing but awake-   understands what I am saying to her CVS: RRR Resp: + trach, coarse mech bilaterally Abd: + soft Ext: min edema, UE greater than LE, improving ACCESS: R internal jugular tunneled HD Cath  Imaging: DG CHEST PORT 1 VIEW  Result Date: 04/25/2023 CLINICAL DATA:  Acute hypoxic respiratory failure EXAM: PORTABLE CHEST 1 VIEW COMPARISON:  Chest radiograph 04/21/2023  FINDINGS: The tracheostomy tube is stable. The right sided vascular catheter terminates in the mid to lower SVC. The cardiomediastinal silhouette is stable and within normal limits Coarsened interstitial markings in both lungs primarily in the lung bases and lucency in the upper lobes consistent with underlying COPD are unchanged. There is no new focal airspace opacity. There is no pulmonary edema. There is no pleural effusion or pneumothorax There is no acute osseous abnormality. IMPRESSION: Stable chest with no radiographic evidence of acute cardiopulmonary process. Electronically Signed   By: Lesia Hausen M.D.   On: 04/25/2023 14:35    Labs: BMET Recent Labs  Lab 04/21/23 0131 04/22/23 0056 04/23/23 0354 04/24/23 0136 04/24/23 0700 04/25/23 0645  NA 135 133* 133* 132*  --  133*  K 5.7* 5.2* 6.0* 3.9  --  4.2  CL 94* 93* 95* 92*  --  91*  CO2 26 26 23 26   --  25  GLUCOSE 139* 141* 110* 119*  --  131*  BUN 137* 78* 128* 65*  --  101*  CREATININE 5.96* 3.62* 5.01* 3.63*  --  5.24*  CALCIUM 8.5* 8.1* 8.5* 8.6*  --  8.8*  PHOS  --   --   --   --  5.1* 6.2*   CBC Recent Labs  Lab 04/22/23 1208 04/23/23 0354 04/25/23 0153 04/25/23 0634  WBC 14.0* 11.9* 12.7* 12.7*  HGB 7.9* 7.6* 9.1* 9.3*  HCT 25.5* 23.9* 29.2* 29.2*  MCV 93.4 94.1 94.5 95.1  PLT 209 195 222 224    Medications:     arformoterol  15 mcg Nebulization BID   atorvastatin  20 mg Per Tube Daily   budesonide (PULMICORT) nebulizer solution  0.5 mg Nebulization BID   Chlorhexidine Gluconate Cloth  6 each Topical Q0600   clonazePAM  0.5 mg Per Tube BID   darbepoetin (ARANESP) injection - DIALYSIS  150 mcg Subcutaneous Q Mon-1800   escitalopram  10 mg Per Tube Daily   fiber  1 packet Per Tube TID   gabapentin  200 mg Per Tube QHS   Gerhardt's butt cream   Topical BID   heparin injection (subcutaneous)  5,000 Units Subcutaneous Q8H   insulin aspart  0-9 Units Subcutaneous Q4H   multivitamin  1 tablet Per Tube QHS    mouth rinse  15 mL Mouth Rinse Q2H   pantoprazole (PROTONIX) IV  40 mg Intravenous Q24H   revefenacin  175 mcg Nebulization Daily   saccharomyces boulardii  250 mg Per Tube BID   sodium chloride flush  10-40 mL Intracatheter Q12H   sodium chloride flush  3 mL Intravenous Q12H    A   04/27/2023, 7:30 AM

## 2023-04-27 NOTE — Progress Notes (Signed)
Patient with intermittent agitation throughout the night. Trach collar removed several times, inner cannula removed and monitoring equipment removed by the patient. Ativan given with relief for a short amount of time. Patient became tachypnic in the 40s and SATS 80%, suctioned with little secretions. Respiratory called to bedside. Patient placed back on the vent.

## 2023-04-27 NOTE — Progress Notes (Signed)
NAME:  Tamara Castro, MRN:  [id number], DOB:  [birth year], LOS: 26 ADMISSION DATE:  04/01/2023  History of Present Illness:  62 year old with COPD, chronic constipation, colectomy in 1990s due to diverticulitis, fibromyalgia on disability since 2008, depression and anxiety, neuropathy.  Presented to [facility] on 03/21/2023 after a week of URI symptoms and was admitted for acute hypoxic and hypercapnic respiratory failure due to right lower lobe pneumonia.  She required intubation and pressor support for septic shock.  She had an AKI and acute heart failure with preserved LVEF and reduced RV EF.  PE ruled out by CT at that time.  She initially improved with antibiotics for CAP coverage and was extubated but then reintubated after clinically worsening.  Hospital course has been complicated by acute on chronic ileus, progression to renal failure, now on HD, prolonged mechanical ventilation requiring tracheostomy, and gastrostomy tube dependence.  Significant Hospital Events:  7/5 admitted [facility] CTX and azith started 7/7 extubated. Vanc added and cefepime started; CTX stopped 7/8 vanc stopped 7/10 reintubated  7/16 tx to [facility]. Cefepime stopped 7/17 attempting lasix. Creatinine worse.  Echo EF 60-65%no WMA RV nml  7/18 cr worse after lasix. Weaning but on-going ileus and cr rising presenting obstacle to extubation Trying neostigmine.  7/19 scr worse in spite of holding lasix. Still on low dose precedex. Mental status and renal fxn still barrier to extubation. Nephro consulted > started CRRT.  HD catheter placed. 7/20 [place] heparin stopped d/t bleeding around HD cath. Got DDAVP.  7/21 resumes cefepime for worsening leukocytosis.  7/22 volumes ok w/ SBT but has sig accessory use  7/23- Trach performed at bedside. CRRT holiday per nephrology. Increased Xanax to home dose 1mg  QID- added seroquel 25mg  BID 7/24- has successfully weaned off propofol and converted to precedex infusion. Tolerating 4+  hours of SBT 7/25- discontinued precedex and fentanyl with as needed fentanyl pushes for pain. Tolerated SBT for 3 hours before failing and being placed on full vent support. Oliguric with rising serum creatinine- nephrology requests transfer to [facility] for restarting dialysis  7/26 multiple emesis, NG to LIS, EEG neg , HD session 7/27 DC Seroquel, decrease Xanax 0.5 twice daily 7/28 Code stroke called for unequal pupils this a.m., head CT negative, seen by neurology; MRI neg 7/31 Hgb drop to 5.5, 2 u prbc, DIC panel Lower extremity Dopplers 8/3 >>  CT abdomen 8/3 >> near complete resolution of pleural effusions, there is a safe window for PEG tube placement 04/23/2023 IR placed tunneled right IJ HD catheter and percutaneous gastrostomy tube  Interim History / Subjective:  Wants ice chips.  Needs something to help sleep.  Was on vent overnight.  Objective   Blood pressure 136/80, pulse 94, temperature 98.5 F (36.9 C), temperature source Oral, resp. rate (!) 22, height 5\' 5"  (1.651 m), weight 72.9 kg, SpO2 95%.    Vent Mode: PCV;CPAP FiO2 (%):  [28 %-40 %] 28 % PEEP:  [5 cmH20] 5 cmH20 Pressure Support:  [5 cmH20-10 cmH20] 10 cmH20   Intake/Output Summary (Last 24 hours) at 04/27/2023 0932 Last data filed at 04/27/2023 0800 Gross per 24 hour  Intake 1580 ml  Output 700 ml  Net 880 ml   Filed Weights   04/25/23 1345 04/25/23 1400 04/27/23 0500  Weight: 70 kg 70 kg 72.9 kg    Examination:  General - alert Eyes - pupils reactive ENT - trach site clean Cardiac - regular rate/rhythm, no murmur Chest - decreased breath sounds b/l,  no wheezing or rales Abdomen - soft, non tender, + bowel sounds Extremities - no cyanosis, clubbing, or edema Skin - no rashes Neuro - tremor  Resolved Hospital Problem list   Community acquired pneumonia, Septic shock, COPD exacerbation, Acute HFpEF, Elevated troponin from demand ischemia, Metabolic acidosis, Ileus  Assessment & Plan:   Acute  hypoxic respiratory failure from CAP with septic shock. Failure to wean s/p tracheostomy. COPD. - pressure support wean to TC as tolerated trach collar as tolerated - goal SpO2 > 90% - f/u CXR intermittently - continue brovana, pulmicort, yupelri - prn albuterol - speech to assess for PMV   AKI from ATN in setting of sepsis now with ESRD on iHD. - nephrology consulted   Anxiety, neuropathy, insomnia, tremor. - continue lexapro, neurontin, klonopin - add ramelteon nightly   Anemia of critical illness and chronic disease. - f/u CBC - continue aranesp - transfuse for Hb < 7   Hx of HLD. - lipitor   Dysphagia. - tube feeds  - speech to assess swallowing   Goals of care. - DNR   Disposition. - TOC to look into LTAC options   Best practice (daily eval):  Diet/type: tubefeeds DVT prophylaxis: LMWH GI prophylaxis: PPI Lines: Dialysis Catheter Foley:  N/A Code Status:  DNR  Labs:      Latest Ref Rng & Units 04/25/2023    6:45 AM 04/24/2023    1:36 AM 04/23/2023    3:54 AM  CMP  Glucose 70 - 99 mg/dL 366  440  347   BUN 8 - 23 mg/dL 425  65  956   Creatinine 0.44 - 1.00 mg/dL 3.87  5.64  3.32   Sodium 135 - 145 mmol/L 133  132  133   Potassium 3.5 - 5.1 mmol/L 4.2  3.9  6.0   Chloride 98 - 111 mmol/L 91  92  95   CO2 22 - 32 mmol/L 25  26  23    Calcium 8.9 - 10.3 mg/dL 8.8  8.6  8.5       Latest Ref Rng & Units 04/25/2023    6:34 AM 04/25/2023    1:53 AM 04/23/2023    3:54 AM  CBC  WBC 4.0 - 10.5 K/uL 12.7  12.7  11.9   Hemoglobin 12.0 - 15.0 g/dL 9.3  9.1  7.6   Hematocrit 36.0 - 46.0 % 29.2  29.2  23.9   Platelets 150 - 400 K/uL 224  222  195     CBG (last 3)  Recent Labs    04/26/23 1913 04/26/23 2310 04/27/23 0310  GLUCAP 114* 108* 129*    ABG    Component Value Date/Time   PHART 7.26 (L) 04/01/2023 1630   PCO2ART 52 (H) 04/01/2023 1630   PO2ART 106 04/01/2023 1630   HCO3 32.8 (H) 04/25/2023 1449   ACIDBASEDEF 4.1 (H) 04/01/2023 1630   O2SAT  88.1 04/25/2023 1449   Signature:  Coralyn Helling, MD [facility] Pager - [phone] or [phone] 04/27/2023, 9:37 AM

## 2023-04-28 ENCOUNTER — Inpatient Hospital Stay (HOSPITAL_COMMUNITY): Payer: 59

## 2023-04-28 ENCOUNTER — Other Ambulatory Visit (HOSPITAL_COMMUNITY): Payer: 59

## 2023-04-28 ENCOUNTER — Inpatient Hospital Stay
Admission: AD | Admit: 2023-04-28 | Discharge: 2023-07-18 | Disposition: E | Payer: 59 | Source: Other Acute Inpatient Hospital | Attending: Internal Medicine | Admitting: Internal Medicine

## 2023-04-28 DIAGNOSIS — J9620 Acute and chronic respiratory failure, unspecified whether with hypoxia or hypercapnia: Secondary | ICD-10-CM | POA: Diagnosis not present

## 2023-04-28 DIAGNOSIS — Z431 Encounter for attention to gastrostomy: Secondary | ICD-10-CM | POA: Diagnosis not present

## 2023-04-28 DIAGNOSIS — J9601 Acute respiratory failure with hypoxia: Secondary | ICD-10-CM | POA: Diagnosis not present

## 2023-04-28 DIAGNOSIS — D649 Anemia, unspecified: Secondary | ICD-10-CM | POA: Diagnosis not present

## 2023-04-28 DIAGNOSIS — I5032 Chronic diastolic (congestive) heart failure: Secondary | ICD-10-CM | POA: Diagnosis not present

## 2023-04-28 DIAGNOSIS — N186 End stage renal disease: Secondary | ICD-10-CM | POA: Diagnosis not present

## 2023-04-28 DIAGNOSIS — Z66 Do not resuscitate: Secondary | ICD-10-CM | POA: Diagnosis not present

## 2023-04-28 DIAGNOSIS — Z515 Encounter for palliative care: Secondary | ICD-10-CM | POA: Diagnosis not present

## 2023-04-28 DIAGNOSIS — E46 Unspecified protein-calorie malnutrition: Secondary | ICD-10-CM | POA: Diagnosis not present

## 2023-04-28 DIAGNOSIS — G629 Polyneuropathy, unspecified: Secondary | ICD-10-CM | POA: Diagnosis not present

## 2023-04-28 DIAGNOSIS — K567 Ileus, unspecified: Secondary | ICD-10-CM | POA: Diagnosis not present

## 2023-04-28 DIAGNOSIS — A419 Sepsis, unspecified organism: Secondary | ICD-10-CM | POA: Diagnosis not present

## 2023-04-28 DIAGNOSIS — Z430 Encounter for attention to tracheostomy: Secondary | ICD-10-CM | POA: Diagnosis not present

## 2023-04-28 DIAGNOSIS — J690 Pneumonitis due to inhalation of food and vomit: Secondary | ICD-10-CM | POA: Diagnosis not present

## 2023-04-28 DIAGNOSIS — D631 Anemia in chronic kidney disease: Secondary | ICD-10-CM | POA: Diagnosis not present

## 2023-04-28 DIAGNOSIS — E8779 Other fluid overload: Secondary | ICD-10-CM | POA: Diagnosis not present

## 2023-04-28 DIAGNOSIS — E872 Acidosis, unspecified: Secondary | ICD-10-CM | POA: Diagnosis not present

## 2023-04-28 DIAGNOSIS — I132 Hypertensive heart and chronic kidney disease with heart failure and with stage 5 chronic kidney disease, or end stage renal disease: Secondary | ICD-10-CM | POA: Diagnosis not present

## 2023-04-28 DIAGNOSIS — Z992 Dependence on renal dialysis: Secondary | ICD-10-CM | POA: Diagnosis not present

## 2023-04-28 DIAGNOSIS — N179 Acute kidney failure, unspecified: Secondary | ICD-10-CM | POA: Diagnosis not present

## 2023-04-28 DIAGNOSIS — R6521 Severe sepsis with septic shock: Secondary | ICD-10-CM | POA: Diagnosis not present

## 2023-04-28 DIAGNOSIS — Z930 Tracheostomy status: Secondary | ICD-10-CM | POA: Diagnosis not present

## 2023-04-28 DIAGNOSIS — N2581 Secondary hyperparathyroidism of renal origin: Secondary | ICD-10-CM | POA: Diagnosis not present

## 2023-04-28 DIAGNOSIS — N170 Acute kidney failure with tubular necrosis: Secondary | ICD-10-CM | POA: Diagnosis not present

## 2023-04-28 DIAGNOSIS — J9621 Acute and chronic respiratory failure with hypoxia: Secondary | ICD-10-CM | POA: Diagnosis not present

## 2023-04-28 DIAGNOSIS — I5030 Unspecified diastolic (congestive) heart failure: Secondary | ICD-10-CM | POA: Diagnosis not present

## 2023-04-28 DIAGNOSIS — I21A1 Myocardial infarction type 2: Secondary | ICD-10-CM | POA: Diagnosis not present

## 2023-04-28 DIAGNOSIS — R1310 Dysphagia, unspecified: Secondary | ICD-10-CM | POA: Diagnosis not present

## 2023-04-28 DIAGNOSIS — G4700 Insomnia, unspecified: Secondary | ICD-10-CM | POA: Diagnosis not present

## 2023-04-28 DIAGNOSIS — J9600 Acute respiratory failure, unspecified whether with hypoxia or hypercapnia: Secondary | ICD-10-CM | POA: Diagnosis not present

## 2023-04-28 DIAGNOSIS — T8249XA Other complication of vascular dialysis catheter, initial encounter: Secondary | ICD-10-CM | POA: Diagnosis not present

## 2023-04-28 DIAGNOSIS — A4159 Other Gram-negative sepsis: Secondary | ICD-10-CM | POA: Diagnosis not present

## 2023-04-28 DIAGNOSIS — E8729 Other acidosis: Secondary | ICD-10-CM | POA: Diagnosis not present

## 2023-04-28 DIAGNOSIS — I340 Nonrheumatic mitral (valve) insufficiency: Secondary | ICD-10-CM | POA: Diagnosis not present

## 2023-04-28 DIAGNOSIS — Z87891 Personal history of nicotine dependence: Secondary | ICD-10-CM | POA: Diagnosis not present

## 2023-04-28 DIAGNOSIS — I2510 Atherosclerotic heart disease of native coronary artery without angina pectoris: Secondary | ICD-10-CM | POA: Diagnosis not present

## 2023-04-28 DIAGNOSIS — J449 Chronic obstructive pulmonary disease, unspecified: Secondary | ICD-10-CM | POA: Diagnosis not present

## 2023-04-28 HISTORY — PX: IR FLUORO GUIDE CV LINE RIGHT: IMG2283

## 2023-04-28 LAB — RENAL FUNCTION PANEL
Albumin: 2.6 g/dL — ABNORMAL LOW (ref 3.5–5.0)
Anion gap: 15 (ref 5–15)
BUN: 134 mg/dL — ABNORMAL HIGH (ref 8–23)
CO2: 23 mmol/L (ref 22–32)
Calcium: 9 mg/dL (ref 8.9–10.3)
Chloride: 92 mmol/L — ABNORMAL LOW (ref 98–111)
Creatinine, Ser: 6.42 mg/dL — ABNORMAL HIGH (ref 0.44–1.00)
GFR, Estimated: 7 mL/min — ABNORMAL LOW (ref >60)
Glucose, Bld: 155 mg/dL — ABNORMAL HIGH (ref 70–99)
Phosphorus: 5.5 mg/dL — ABNORMAL HIGH (ref 2.5–4.6)
Potassium: 6 mmol/L — ABNORMAL HIGH (ref 3.5–5.1)
Sodium: 130 mmol/L — ABNORMAL LOW (ref 135–145)

## 2023-04-28 LAB — CBC
HCT: 30.2 % — ABNORMAL LOW (ref 36.0–46.0)
Hemoglobin: 9.3 g/dL — ABNORMAL LOW (ref 12.0–15.0)
MCH: 30.5 pg (ref 26.0–34.0)
MCHC: 30.8 g/dL (ref 30.0–36.0)
MCV: 99 fL (ref 80.0–100.0)
Platelets: 260 K/uL (ref 150–400)
RBC: 3.05 MIL/uL — ABNORMAL LOW (ref 3.87–5.11)
RDW: 20.2 % — ABNORMAL HIGH (ref 11.5–15.5)
WBC: 13.4 K/uL — ABNORMAL HIGH (ref 4.0–10.5)
nRBC: 0 % (ref 0.0–0.2)

## 2023-04-28 LAB — BLOOD GAS, ARTERIAL
Acid-Base Excess: 9 mmol/L — ABNORMAL HIGH (ref 0.0–2.0)
Bicarbonate: 35.5 mmol/L — ABNORMAL HIGH (ref 20.0–28.0)
O2 Saturation: 97.2 %
Patient temperature: 36.8
pCO2 arterial: 59 mmHg — ABNORMAL HIGH (ref 32–48)
pH, Arterial: 7.38 (ref 7.35–7.45)
pO2, Arterial: 80 mmHg — ABNORMAL LOW (ref 83–108)

## 2023-04-28 MED ORDER — LIDOCAINE HCL 1 % IJ SOLN
INTRAMUSCULAR | Status: AC
  Filled 2023-04-28: qty 20

## 2023-04-28 MED ORDER — HEPARIN SODIUM (PORCINE) 1000 UNIT/ML IJ SOLN
INTRAMUSCULAR | Status: AC
  Filled 2023-04-28: qty 10

## 2023-04-28 MED ORDER — ARFORMOTEROL TARTRATE 15 MCG/2ML IN NEBU: 15.0000 ug | INHALATION_SOLUTION | Freq: Two times a day (BID) | RESPIRATORY_TRACT | Status: DC

## 2023-04-28 MED ORDER — ACETAMINOPHEN 160 MG/5ML PO SOLN: 650.0000 mg | ORAL | Status: DC | PRN

## 2023-04-28 MED ORDER — BISACODYL 10 MG RE SUPP: 10.0000 mg | Freq: Every day | RECTAL | Status: DC | PRN

## 2023-04-28 MED ORDER — CEFAZOLIN SODIUM-DEXTROSE 2-4 GM/100ML-% IV SOLN
2.0000 g | Freq: Once | INTRAVENOUS | Status: AC
  Administered 2023-04-28: 2 g via INTRAVENOUS

## 2023-04-28 MED ORDER — CEFAZOLIN SODIUM-DEXTROSE 1-4 GM/50ML-% IV SOLN
1.0000 g | Freq: Once | INTRAVENOUS | Status: DC
  Filled 2023-04-28: qty 50

## 2023-04-28 MED ORDER — NEPRO/CARBSTEADY PO LIQD
1000.0000 mL | ORAL | Status: DC
  Administered 2023-04-28: 1000 mL

## 2023-04-28 MED ORDER — CARMEX CLASSIC LIP BALM EX OINT: 1.0000 | TOPICAL_OINTMENT | CUTANEOUS | Status: DC | PRN

## 2023-04-28 MED ORDER — CLONAZEPAM 0.5 MG PO TABS: 0.5000 mg | ORAL_TABLET | Freq: Two times a day (BID) | ORAL | Status: DC

## 2023-04-28 MED ORDER — ESCITALOPRAM OXALATE 10 MG PO TABS: 10.0000 mg | ORAL_TABLET | Freq: Every day | ORAL | Status: DC

## 2023-04-28 MED ORDER — ORAL CARE MOUTH RINSE: 15.0000 mL | OROMUCOSAL | Status: DC

## 2023-04-28 MED ORDER — PANTOPRAZOLE SODIUM 40 MG PO PACK: 40.0000 mg | PACK | Freq: Every day | ORAL | Status: DC

## 2023-04-28 MED ORDER — DIATRIZOATE MEGLUMINE & SODIUM 66-10 % PO SOLN
30.0000 mL | Freq: Once | ORAL | Status: AC
  Administered 2023-04-28: 30 mL

## 2023-04-28 MED ORDER — HEPARIN SODIUM (PORCINE) 5000 UNIT/ML IJ SOLN: 5000.0000 [IU] | Freq: Three times a day (TID) | INTRAMUSCULAR | Status: DC

## 2023-04-28 MED ORDER — RENA-VITE PO TABS: 1.0000 | ORAL_TABLET | Freq: Every day | ORAL | Status: DC

## 2023-04-28 MED ORDER — DARBEPOETIN ALFA 150 MCG/0.3ML IJ SOSY: 150.0000 ug | PREFILLED_SYRINGE | INTRAMUSCULAR | Status: DC

## 2023-04-28 MED ORDER — REVEFENACIN 175 MCG/3ML IN SOLN: 175.0000 ug | Freq: Every day | RESPIRATORY_TRACT | Status: DC

## 2023-04-28 MED ORDER — HYDROXYZINE HCL 25 MG PO TABS: 25.0000 mg | ORAL_TABLET | Freq: Two times a day (BID) | ORAL | Status: DC | PRN

## 2023-04-28 MED ORDER — GERHARDT'S BUTT CREAM: 1.0000 | TOPICAL_CREAM | Freq: Two times a day (BID) | CUTANEOUS | Status: DC

## 2023-04-28 MED ORDER — ALBUTEROL SULFATE (2.5 MG/3ML) 0.083% IN NEBU: 2.5000 mg | INHALATION_SOLUTION | RESPIRATORY_TRACT | Status: DC | PRN

## 2023-04-28 MED ORDER — GABAPENTIN 250 MG/5ML PO SOLN: 200.0000 mg | Freq: Every day | ORAL | Status: DC

## 2023-04-28 MED ORDER — PROSOURCE TF20 ENFIT COMPATIBL EN LIQD: 60.0000 mL | Freq: Every day | ENTERAL | Status: DC

## 2023-04-28 MED ORDER — ONDANSETRON HCL 4 MG/2ML IJ SOLN: 4.0000 mg | Freq: Four times a day (QID) | INTRAMUSCULAR | Status: DC | PRN

## 2023-04-28 MED ORDER — BUDESONIDE 0.5 MG/2ML IN SUSP: 0.5000 mg | Freq: Two times a day (BID) | RESPIRATORY_TRACT | Status: DC

## 2023-04-28 MED ORDER — DOCUSATE SODIUM 50 MG/5ML PO LIQD: 100.0000 mg | Freq: Two times a day (BID) | ORAL | Status: DC | PRN

## 2023-04-28 MED ORDER — NEPRO/CARBSTEADY PO LIQD: 1000.0000 mL | ORAL | Status: DC

## 2023-04-28 MED ORDER — POLYETHYLENE GLYCOL 3350 17 G PO PACK: 17.0000 g | PACK | Freq: Every day | ORAL | Status: DC | PRN

## 2023-04-28 MED ORDER — HEPARIN SODIUM (PORCINE) 1000 UNIT/ML IJ SOLN
3200.0000 [IU] | Freq: Once | INTRAMUSCULAR | Status: AC
  Administered 2023-04-28: 3200 [IU]

## 2023-04-28 MED ORDER — ATORVASTATIN CALCIUM 20 MG PO TABS: 20.0000 mg | ORAL_TABLET | Freq: Every day | ORAL | Status: DC

## 2023-04-28 MED ORDER — PROSOURCE TF20 ENFIT COMPATIBL EN LIQD
60.0000 mL | Freq: Every day | ENTERAL | Status: DC
  Administered 2023-04-28: 60 mL
  Filled 2023-04-28: qty 60

## 2023-04-28 MED ORDER — NUTRISOURCE FIBER PO PACK: 1.0000 | PACK | Freq: Three times a day (TID) | ORAL | Status: DC

## 2023-04-28 MED ORDER — LIDOCAINE HCL 1 % IJ SOLN
20.0000 mL | Freq: Once | INTRAMUSCULAR | Status: AC
  Administered 2023-04-28: 20 mL
  Filled 2023-04-28: qty 20

## 2023-04-28 MED ORDER — CEFAZOLIN SODIUM-DEXTROSE 2-4 GM/100ML-% IV SOLN
INTRAVENOUS | Status: AC
  Filled 2023-04-28: qty 100

## 2023-04-28 MED ORDER — RAMELTEON 8 MG PO TABS: 8.0000 mg | ORAL_TABLET | Freq: Every day | ORAL | Status: DC

## 2023-04-28 MED ORDER — SACCHAROMYCES BOULARDII 250 MG PO CAPS: 250.0000 mg | ORAL_CAPSULE | Freq: Two times a day (BID) | ORAL | Status: DC

## 2023-04-28 MED ORDER — ORAL CARE MOUTH RINSE: 15.0000 mL | OROMUCOSAL | Status: DC | PRN

## 2023-04-28 NOTE — Progress Notes (Signed)
Patient is tachycardic and hypertensive while getting dialysis. She is back on the ventilator. Currently holding antihypertensives until dialysis is complete.

## 2023-04-28 NOTE — TOC Transition Note (Signed)
Transition of Care [facility]) - CM/SW Discharge Note   Patient Details  Name: Tamara Castro MRN: [id number] Date of Birth: Sep 06, 1961  Transition [facility]) CM/SW Contact:  Harriet Masson, RN Phone Number: 04/28/2023, 12:27 PM   Clinical Narrative:    Patient stable to transfer to [facility] today.    Final next level of care: Long Term Acute Care (LTAC) Barriers to Discharge: Barriers Resolved   Patient Goals and CMS Choice      Discharge Placement                         Discharge Plan and Services Additional resources added to the After Visit Summary for   In-house Referral: Clinical Social Work                                   Social Determinants of Health (SDOH) Interventions SDOH Screenings   Food Insecurity: No Food Insecurity (04/04/2023)  Housing: Low Risk  (04/04/2023)  Transportation Needs: No Transportation Needs (04/04/2023)  Utilities: Not At Risk (04/04/2023)  Alcohol Screen: Low Risk  (04/09/2021)  Depression (PHQ2-9): Low Risk  (04/09/2021)  Financial Resource Strain: Low Risk  (04/09/2021)  Physical Activity: Insufficiently Active (04/09/2021)  Social Connections: Socially Isolated (04/09/2021)  Stress: No Stress Concern Present (04/09/2021)  Tobacco Use: High Risk (04/04/2023)     Readmission Risk Interventions     No data to display

## 2023-04-28 NOTE — Progress Notes (Signed)
Pt was placed back on ventilator due to pt desaturating on trach collar. Pt Is stable at this time.

## 2023-04-28 NOTE — Progress Notes (Signed)
PT Cancellation Note  Patient Details Name: RUSHIA BERTZ MRN: [id number] DOB: [birth year]   Cancelled Treatment:    Reason Eval/Treat Not Completed: (P) Patient at procedure or test/unavailable, pt receiving HD. Will check back as schedule allows to continue with PT POC.  Lenora Boys. PTA Acute Rehabilitation Services Office: [phone]     Catalina Antigua 04/28/2023, 11:52 AM

## 2023-04-28 NOTE — Progress Notes (Signed)
   04/28/23 1200  Vitals  Temp 97.7 F (36.5 C)  Pulse Rate (!) 108  Resp (!) 26  BP 135/86  SpO2 98 %  O2 Device Tracheostomy Collar;Ventilator  Weight 74.7 kg  Type of Weight Post-Dialysis  Oxygen Therapy  FiO2 (%) 60 %  Patient Activity (if Appropriate) In bed  Pulse Oximetry Type Continuous  Oximetry Probe Site Changed No  Post Treatment  Dialyzer Clearance Lightly streaked  Duration of HD Treatment -hour(s) 3.5 hour(s)  Hemodialysis Intake (mL) 0 mL  Liters Processed 63.4  Fluid Removed (mL) 2000 mL  Tolerated HD Treatment Yes  Post-Hemodialysis Comments pt to go to IR for CVC change prior to discharge   Pt tx done at bedside---ICU.  Arouses to name--nods appropriately Informed consent signed and in chart.   TX duration:3.5  Patient tolerated--no acute distress--- Hand-off given to patient's nurse.   Access used: [facility] Access issues: lines extremely positional based on her respiration patterns---pt to go to IR today for a CVC exchange per Dr. Juel Burrow  Total UF removed: 2000 Medication(s) given: none   [facility]

## 2023-04-28 NOTE — Progress Notes (Signed)
SLP Cancellation Note  Patient Details Name: Tamara Castro MRN: [id number] DOB: 1961-03-06   Cancelled treatment:       Reason Eval/Treat Not Completed: Patient not medically ready. Dialysis today and has already experienced fatigue and need for vent. Will f/u on an non HD day.    , Riley Nearing 04/28/2023, 11:04 AM

## 2023-04-28 NOTE — Progress Notes (Signed)
Pt was taken off of the ventilator and placed on 8L/50% trach collar. Pt is tolerating well at this time. RT will monitor.

## 2023-04-28 NOTE — Progress Notes (Signed)
Nutrition Follow-up  DOCUMENTATION CODES:  Not applicable  INTERVENTION:  Adjust TF to the following via PEG: Nepro at 57mL/h ( ) Prosource TF 20 1x/d This provides 1992 kcal, 107g of protein, of water Renal MVI daily Nutrasource fiber TID to aid in management of loose stools Probiotics BID to promote gut health  NUTRITION DIAGNOSIS:  Inadequate oral intake related to inability to eat as evidenced by NPO status. - remains applicable   GOAL:  Patient will meet greater than or equal to 90% of their needs - progressing, being met with TF at goal  MONITOR:  Vent status, Labs, Weight trends  REASON FOR ASSESSMENT:  Consult Other (Comment) (advance cortrak 6-7 cm)  ASSESSMENT:  Pt with hx COPD, chronic constipation, colectomy (in the 1990's) due to diverticulitis, fibromyalgia, HLD, and depression who initially presented to [facility] for 1 week hx of URI symptoms admitted for respiratory failure due to RLL PNA and septic shock.  7/5: admitted [facility] 7/7: extubated 7/10: reintubated  7/16: tx to [facility], remains intubated  7/18: OGT replaced with small bore NG feeding tube 7/19: IR attempted NGT advancement to post-pyloric but unsuccessful tube left in stomach; Trickle feeds started; starting on CRRT 7/22: TF advanced to 4mL/hr 7/23 - trach placed, CRRT stopped 7/25 - transferred to [facility] for iHD. Vomiting on arrival, NGT placed for suction 7/28 - code stroke called, negative workup 8/1 - small bore tube clogged, removed 8/2 - cortrak placed, distal stomach 8/7 - PEG and tunneled HD catheter placed in IR  Pt on TC this AM but adjusted back to vent after developing increased WOB during HD. Discussed in rounds, pt stable to be discharged to [facility] today.   Did change formula to nepro as pt continues to have electrolyte abnormalities. Provides slightly less volume. Pt with continued loose stools. Hopefully the additonal fiber in Nepro will add bulk to stools.   Last HD  8/9, scheduled to receive today Net UF 2.4L  Temp (24hrs), Avg:98.1 F (36.7 C), Min:97.7 F (36.5 C), Max:98.7 F (37.1 C)   Intake/Output Summary (Last 24 hours) at 04/28/2023 1140 Last data filed at 04/28/2023 1100 Gross per 24 hour  Intake 1767.25 ml  Output 200 ml  Net 1567.25 ml  Net IO Since Admission: -9,170.16 mL [04/28/23 1140]  Nutritionally Relevant Medications: Scheduled Meds:  atorvastatin  20 mg Per Tube Daily   fiber  1 packet Per Tube TID   multivitamin  1 tablet Per Tube QHS   pantoprazole IV  40 mg Intravenous Q24H   ramelteon  8 mg Per Tube QHS   saccharomyces boulardii  250 mg Per Tube BID   Continuous Infusions:  NEPRO CARB STEADY     PRN Meds: bisacodyl, docusate, ondansetron, polyethylene glycol Labs Reviewed: Na 130, chloride 92 K 6 BUN 134, creatinine 6.42 Phosphorus 5.5 Mg 2.7 CBG ranges from 129-155 mg/dL over the last 24 hours  Diet Order:   Diet Order             Diet - low sodium heart healthy           Diet NPO time specified Except for: Ice Chips  Diet effective now                  EDUCATION NEEDS:  Not appropriate for education at this time  Skin: Skin Assessment: Reviewed RN Assessment Skin Integrity Issues:: Stage I Stage I: Left buttocks  Last BM:  8/12 - type 7 via FMS FMS placed  7/28 output in the last 24h + 2 unmeasured occurances  Height:  Ht Readings from Last 1 Encounters:  04/01/23 5\' 5"  (1.651 m)   Weight:  Wt Readings from Last 1 Encounters:  04/28/23 76.7 kg   Ideal Body Weight:  56.82 kg  BMI:  Body mass index is 28.14 kg/m.  Estimated Nutritional Needs:  Kcal:  1900-2200 kcal/d Protein:  95-115 grams Fluid:  1L+UOP   Greig Castilla, RD, LDN Clinical Dietitian RD pager # available in [facility]  After hours/weekend pager # available in [facility]

## 2023-04-28 NOTE — Discharge Instructions (Addendum)
To [name] or their caretakers,  They were admitted to [facility] on 04/01/2023 for evaluation and treatment of:  Active Problems:   Pressure injury of skin   Acute renal failure (HCC)   COPD (chronic obstructive pulmonary disease) (HCC)   Tobacco abuse   (HFpEF) heart failure with preserved ejection fraction (HCC)   CAD (coronary artery disease)   Anemia   Depression   Anxiety   Tracheostomy status (HCC)   Acute on chronic respiratory failure with hypoxia (HCC)  Principal Problem (Resolved):   Acute hypoxemic respiratory failure (HCC) Resolved Problems:   Aspiration pneumonia (HCC)   CAP (community acquired pneumonia)   Septic shock (HCC)   Ileus (HCC)   Volume overload   COPD with acute exacerbation (HCC)   Acute heart failure with preserved ejection fraction (HCC)   Leukocytosis   Elevated troponin   Metabolic acidosis   Thrombocytopenia (HCC)   Acute encephalopathy  They were discharged from the hospital on 04/28/23. I recommend the following after leaving the hospital:   Acute on chronic hypoxic and hypercapnic respiratory failure - tracheostomy in place - tolerating extended trach collar time but gets exhausted with speech therapy and dialysis - she should probably be on pressure support during dialysis - continue lama/laba/ics via neb - work toward speaking valve  ATN with AKI and progression to renal failure - HD dependent, MWF - HD cath is positional and has had some flow issues, nephrology recommends replacement  Anxiety, neuropathy, insomnia, tremor - lexapro - gabapentin - klonopin - ramelteon  Anemia of critical illness - limit phlebotomy - iron replete - aranesp q weekly  Dysphagia - gastrostomy in place - tube feeding without complications  Stage 1 pressure injury left buttock - frequent turns  Marrianne Mood MD 04/28/2023, 10:39 AM

## 2023-04-28 NOTE — Progress Notes (Signed)
[facility] Progress Note   Assessment/ Plan:   Acute kidney injury - b/l creat 1.0 from jan 2023. Creat was 1.6 on admission in setting of pneumonia, hypotension and septic shock.  peaked at 1.7 and returned to 1.0 on 03/28/23 Renal failure then worsened again up to 3.84 and CRRT started 7/19- has been RRT dependent since. AKI felt to be related to hypotension (only oral contrast given) +/- diuretics; only contrast IV given was at [facility] on 7/11.  - now on MWF IHD  next on  Monday now via [facility]  - no evidence of recover as of yet -  Seen on HD Monday 8/12 1K 1st 30 min and now 2K 2L goal Having issues with the RIJ TC and only able to get flows up to 370 ml/min.  Especially major issues with the TC whenever pt breathes; will ask VIR to convert to a 23cm CTT.   Sepsis/ shock/ hypotension - initial dx on admission was pna and septic shock, now resolved.  Acute hypoxic respiratory failure - due to COPD/ pna, on vent w/ trach per CCM. F/u CXR 7/23 was negative for edema. Attempting to wean vent Volume excess/ lower extremity edema-  much improved-  soft BP limits UF some Anemia - Hb 7- 8 range, tranfuse prn-   blood given 8/6.  added ESA-  overall improved  Anxiety/ depression Dispo-  possibly planning for LTAC Hyperkalemia-  had been an issue  -   better after HD  Subjective:    Has had anxiety issues;  no UOP, crt rising off of HD   Objective:   BP (!) 182/107   Pulse (!) 113   Temp 97.8 F (36.6 C) (Axillary)   Resp (!) 23   Ht 5\' 5"  (1.651 m)   Wt 76.7 kg   SpO2 98%   BMI 28.14 kg/m   Intake/Output Summary (Last 24 hours) at 04/28/2023 9528 Last data filed at 04/28/2023 4132 Gross per 24 hour  Intake 1570 ml  Output 400 ml  Net 1170 ml   Weight change: 3.8 kg  Physical Exam: Gen:ill-appearing, sedated, trached while on HD CVS: tachy Resp: + trach, coarse mech bilaterally Abd: + soft Ext: min edema, UE greater than LE, improving ACCESS: R internal  jugular tunneled HD Cath  Imaging: No results found.  Labs: BMET Recent Labs  Lab 04/22/23 0056 04/23/23 0354 04/24/23 0136 04/24/23 0700 04/25/23 0645 04/28/23 0558  NA 133* 133* 132*  --  133* 130*  K 5.2* 6.0* 3.9  --  4.2 6.0*  CL 93* 95* 92*  --  91* 92*  CO2 26 23 26   --  25 23  GLUCOSE 141* 110* 119*  --  131* 155*  BUN 78* 128* 65*  --  101* 134*  CREATININE 3.62* 5.01* 3.63*  --  5.24* 6.42*  CALCIUM 8.1* 8.5* 8.6*  --  8.8* 9.0  PHOS  --   --   --  5.1* 6.2* 5.5*   CBC Recent Labs  Lab 04/23/23 0354 04/25/23 0153 04/25/23 0634 04/28/23 0558  WBC 11.9* 12.7* 12.7* 13.4*  HGB 7.6* 9.1* 9.3* 9.3*  HCT 23.9* 29.2* 29.2* 30.2*  MCV 94.1 94.5 95.1 99.0  PLT 195 222 224 260    Medications:     arformoterol  15 mcg Nebulization BID   atorvastatin  20 mg Per Tube Daily   budesonide (PULMICORT) nebulizer solution  0.5 mg Nebulization BID   Chlorhexidine Gluconate Cloth  6 each Topical  Q0600   clonazePAM  0.5 mg Per Tube BID   darbepoetin (ARANESP) injection - DIALYSIS  150 mcg Subcutaneous Q Mon-1800   escitalopram  10 mg Per Tube Daily   feeding supplement (PROSource TF20)  60 mL Per Tube Daily   fiber  1 packet Per Tube TID   gabapentin  200 mg Per Tube QHS   Gerhardt's butt cream   Topical BID   heparin injection (subcutaneous)  5,000 Units Subcutaneous Q8H   multivitamin  1 tablet Per Tube QHS   mouth rinse  15 mL Mouth Rinse 4 times per day   pantoprazole (PROTONIX) IV  40 mg Intravenous Q24H   ramelteon  8 mg Per Tube QHS   revefenacin  175 mcg Nebulization Daily   saccharomyces boulardii  250 mg Per Tube BID   sodium chloride flush  10-40 mL Intracatheter Q12H   sodium chloride flush  3 mL Intravenous Q12H   ,  W  04/28/2023, 9:27 AM

## 2023-04-28 NOTE — Procedures (Signed)
Interventional Radiology Procedure Note  Procedure:  Exchange of a right IJ approach tunneled HD catheter.  New 23cm tip to cuff.  Tip is positioned at the superior cavoatrial junction and catheter is ready for immediate use.   Excellent flow at conclusion.   Complications: None  Recommendations:  - Ok to use - Do not submerge - Routine line care   Signed,  Yvone Neu. Loreta Ave, DO

## 2023-04-28 NOTE — Progress Notes (Addendum)
Pt had brief desaturation with SOB while on bedside HD. Pt was placed back on full support ventilation per CCM. Plan to try to take pt back off of vent and place on trach collar after HD is complete.

## 2023-04-28 NOTE — Discharge Summary (Addendum)
Name: Tamara Castro MRN: [id number] DOB: [birth year] 62 y.o. PCP: Sheela Stack  Date of Admission: 04/01/2023  3:08 PM Date of Discharge: 04/28/2023 10:57 AM Attending Physician: Lorin Glass, MD  Discharge diagnosis: Active Problems:   Pressure injury of skin   Acute renal failure (HCC)   COPD (chronic obstructive pulmonary disease) (HCC)   Tobacco abuse   (HFpEF) heart failure with preserved ejection fraction (HCC)   CAD (coronary artery disease)   Anemia   Depression   Anxiety   Tracheostomy status (HCC)   Acute on chronic respiratory failure with hypoxia (HCC)  Principal Problem (Resolved):   Acute hypoxemic respiratory failure (HCC) Resolved Problems:   Aspiration pneumonia (HCC)   CAP (community acquired pneumonia)   Septic shock (HCC)   Ileus (HCC)   Volume overload   COPD with acute exacerbation (HCC)   Acute heart failure with preserved ejection fraction (HCC)   Leukocytosis   Elevated troponin   Metabolic acidosis   Thrombocytopenia (HCC)   Acute encephalopathy   Discharge medications: Allergies as of 04/28/2023       Reactions   Ciprofloxacin Diarrhea   Per pt, this medication gave her c-diff        Medication List     STOP taking these medications    albuterol 0.63 MG/3ML nebulizer solution Commonly known as: ACCUNEB   ALPRAZolam 1 MG tablet Commonly known as: XANAX   Anoro Ellipta 62.5-25 MCG/INH Aepb Generic drug: umeclidinium-vilanterol   gabapentin 400 MG capsule Commonly known as: NEURONTIN Replaced by: gabapentin 250 MG/5ML solution   ondansetron 4 MG tablet Commonly known as: ZOFRAN   ProAir HFA 108 (90 Base) MCG/ACT inhaler Generic drug: albuterol Replaced by: albuterol (2.5 MG/3ML) 0.083% nebulizer solution   ZZZQUIL PO       TAKE these medications    acetaminophen 160 MG/5ML solution Commonly known as: TYLENOL Place 20.3 mLs (650 mg total) into feeding tube every 4 (four) hours as needed  for fever.   albuterol (2.5 MG/3ML) 0.083% nebulizer solution Commonly known as: PROVENTIL Take 3 mLs (2.5 mg total) by nebulization every 4 (four) hours as needed for shortness of breath or wheezing. Replaces: ProAir HFA 108 (90 Base) MCG/ACT inhaler   arformoterol 15 MCG/2ML Nebu Commonly known as: BROVANA Take 2 mLs (15 mcg total) by nebulization 2 (two) times daily.   atorvastatin 20 MG tablet Commonly known as: LIPITOR Place 1 tablet (20 mg total) into feeding tube daily. Start taking on: April 29, 2023   bisacodyl 10 MG suppository Commonly known as: DULCOLAX Place 1 suppository (10 mg total) rectally daily as needed for moderate constipation.   budesonide 0.5 MG/2ML nebulizer solution Commonly known as: PULMICORT Take 2 mLs (0.5 mg total) by nebulization 2 (two) times daily.   clonazePAM 0.5 MG tablet Commonly known as: KLONOPIN Place 1 tablet (0.5 mg total) into feeding tube 2 (two) times daily.   Darbepoetin Alfa 150 MCG/0.3ML Sosy injection Commonly known as: ARANESP Inject 0.3 mLs (150 mcg total) into the skin every Monday at 6 PM.   docusate 50 MG/5ML liquid Commonly known as: COLACE Place 10 mLs (100 mg total) into feeding tube 2 (two) times daily as needed for mild constipation.   escitalopram 10 MG tablet Commonly known as: LEXAPRO Place 1 tablet (10 mg total) into feeding tube daily. Start taking on: April 29, 2023 What changed:  how to take this when to take this   feeding supplement (NEPRO CARB STEADY) Liqd Place  1,000 mLs into feeding tube continuous.   feeding supplement (PROSource TF20) liquid Place 60 mLs into feeding tube daily. Start taking on: April 29, 2023   fiber Pack packet Place 1 packet into feeding tube 3 (three) times daily.   gabapentin 250 MG/5ML solution Commonly known as: NEURONTIN Place 4 mLs (200 mg total) into feeding tube at bedtime. Replaces: gabapentin 400 MG capsule   Gerhardt's butt cream Crea Apply 1  Application topically 2 (two) times daily.   heparin 5000 UNIT/ML injection Inject 1 mL (5,000 Units total) into the skin every 8 (eight) hours.   hydrOXYzine 25 MG tablet Commonly known as: ATARAX Place 1 tablet (25 mg total) into feeding tube 2 (two) times daily as needed for itching.   lip balm ointment Apply 1 Application topically as needed (cold sores).   multivitamin Tabs tablet Place 1 tablet into feeding tube at bedtime.   pantoprazole sodium 40 mg Commonly known as: PROTONIX Place 40 mg into feeding tube daily.   polyethylene glycol 17 g packet Commonly known as: MIRALAX / GLYCOLAX 17 g by Per NG tube route daily as needed for mild constipation.   ramelteon 8 MG tablet Commonly known as: ROZEREM Place 1 tablet (8 mg total) into feeding tube at bedtime.   revefenacin 175 MCG/3ML nebulizer solution Commonly known as: YUPELRI Take 3 mLs (175 mcg total) by nebulization daily. Start taking on: April 29, 2023   saccharomyces boulardii 250 MG capsule Commonly known as: FLORASTOR Place 1 capsule (250 mg total) into feeding tube 2 (two) times daily.               Discharge Care Instructions  (From admission, onward)           Start     Ordered   04/28/23 0000  Discharge wound care:       Comments: Stage 1 on left buttock.   04/28/23 1053            Follow-up appointments:   Disposition and recommendations: Tamara Castro is a 62 y.o. year old hospitalized for respiratory failure due to pneumonia. She had a prolonged hospital course complicated by septic shock, acute renal failure, and ileus. She is now tracheostomy, gastrostomy, and hemodialysis dependent. Discharge to [facility] for long-term acute care.  Acute on chronic hypoxic and hypercapnic respiratory failure - tracheostomy in place - tolerating extended trach collar time but gets exhausted with speech therapy and dialysis - she should probably be on pressure  support during dialysis - continue lama/laba/ics via neb - work toward speaking valve  ATN with AKI and progression to renal failure - HD dependent, MWF - HD cath is positional and has had some flow issues, nephrology recommends replacement - IR at [facility] aware and planning for replacement of tenuous HD cath  Anxiety, neuropathy, insomnia, tremor - lexapro - gabapentin - klonopin - ramelteon  Anemia of critical illness - limit phlebotomy - iron replete - aranesp q weekly  Dysphagia - gastrostomy in place - tube feeding without complications  Stage 1 pressure injury left buttock - frequent turns  Hospital course: 62 year old female with COPD initially presented to [facility] ED on 03/21/2023 after a week of upper respiratory symptoms.  She was in acute hypoxic and hypercapnic respiratory failure, found to have sepsis due to right lower lobe pneumonia.  She required intubation.  Lower respiratory cultures grew Moraxella.  She was treated with several days of cefepime, vancomycin, and doxycycline.  She was  also treated for concomitant COPD exacerbation.  Her hospital course was complicated by type II MI with right sided congestive heart failure, AKI with progression to acute renal failure requiring renal replacement therapy, ileus, and anemia.  She was eventually transferred to the MICU at [facility]  Her condition did eventually stabilize, but she is now tracheostomy and gastrostomy tube dependent and will require a period of time at a specialty hospital for long-term acute care.  Discharge exam: Comfortable with tracheostomy connected to ventilator, about to start dialysis   Blood pressure (!) 141/99, pulse (!) 115, temperature 97.8 F (36.6 C), temperature source Axillary, resp. rate (!) 27, height 5\' 5"  (1.651 m), weight 76.7 kg, SpO2 99%.  No distress Tracheostomy connected to vent, cuff inflated Heart rate is tachycardic, rhythm is regular, radials are  strong Bilateral lung sounds very diminished Skin warm and dry Alert, answers questions and follows instructions  Pertinent studies and procedures: Recent Results (from the past 16109 hour(s))  ECHOCARDIOGRAM COMPLETE   Collection Time: 04/02/23 11:51 AM  Result Value   Weight 3,255.75   Height 65   BP 138/76   S' Lateral 3.90   Area-P 1/2 4.06   Est EF 60 - 65%   Narrative      ECHOCARDIOGRAM REPORT       Patient Name:   Tamara Castro Date of Exam: 04/02/2023 Medical Rec #:  [id number]           Height:       65.0 in Accession #:    [id number]          Weight:       203.5 lb Date of Birth:  02-10-1961          BSA:          1.993 m Patient Age:    61 years            BP:           137/72 mmHg Patient Gender: F                   HR:           64 bpm. Exam Location:  Inpatient  Procedure: 2D Echo, Cardiac Doppler and Color Doppler  Indications:    Elevated Troponin   History:        Patient has no prior history of Echocardiogram examinations.                 COPD; Risk Factors:Dyslipidemia.   Sonographer:    Harriette Bouillon RDCS Referring Phys: [phone] PAULA B SIMPSON  IMPRESSIONS    1. Left ventricular ejection fraction, by estimation, is 60 to 65%. The left ventricle has normal function. The left ventricle has no regional wall motion abnormalities. Left ventricular diastolic parameters were normal.  2. Right ventricular systolic function is normal. The right ventricular size is normal.  3. The mitral valve is normal in structure. No evidence of mitral valve regurgitation. No evidence of mitral stenosis.  4. The aortic valve is tricuspid. There is mild calcification of the aortic valve. Aortic valve regurgitation is not visualized. No aortic stenosis is present.  5. The inferior vena cava is dilated in size with <50% respiratory variability, suggesting right atrial pressure of 15 mmHg.  FINDINGS  Left Ventricle: Left ventricular ejection fraction, by estimation, is  60 to 65%. The left ventricle has normal function. The left ventricle has no regional wall motion abnormalities. The  left ventricular internal cavity size was normal in size. There is  no left ventricular hypertrophy. Left ventricular diastolic parameters were normal.  Right Ventricle: The right ventricular size is normal. No increase in right ventricular wall thickness. Right ventricular systolic function is normal.  Left Atrium: Left atrial size was normal in size.  Right Atrium: Right atrial size was normal in size.  Pericardium: There is no evidence of pericardial effusion.  Mitral Valve: The mitral valve is normal in structure. No evidence of mitral valve regurgitation. No evidence of mitral valve stenosis.  Tricuspid Valve: The tricuspid valve is normal in structure. Tricuspid valve regurgitation is trivial. No evidence of tricuspid stenosis.  Aortic Valve: The aortic valve is tricuspid. There is mild calcification of the aortic valve. Aortic valve regurgitation is not visualized. No aortic stenosis is present.  Pulmonic Valve: The pulmonic valve was normal in structure. Pulmonic valve regurgitation is trivial. No evidence of pulmonic stenosis.  Aorta: The aortic root is normal in size and structure.  Venous: The inferior vena cava is dilated in size with less than 50% respiratory variability, suggesting right atrial pressure of 15 mmHg.  IAS/Shunts: No atrial level shunt detected by color flow Doppler.    LEFT VENTRICLE PLAX 2D LVIDd:         5.10 cm   Diastology LVIDs:         3.90 cm   LV e' medial:    7.83 cm/s LV PW:         0.70 cm   LV E/e' medial:  10.0 LV IVS:        0.70 cm   LV e' lateral:   7.51 cm/s LVOT diam:     2.10 cm   LV E/e' lateral: 10.5 LV SV:         71 LV SV Index:   35 LVOT Area:     3.46 cm    RIGHT VENTRICLE             IVC RV S prime:     14.70 cm/s  IVC diam: 2.40 cm TAPSE (M-mode): 1.8 cm  LEFT ATRIUM             Index        RIGHT ATRIUM            Index LA diam:        2.70 cm 1.35 cm/m   RA Area:     10.10 cm LA Vol (A2C):   35.7 ml 17.92 ml/m  RA Volume:   18.00 ml  9.03 ml/m LA Vol (A4C):   30.5 ml 15.31 ml/m LA Biplane Vol: 35.8 ml 17.97 ml/m  AORTIC VALVE LVOT Vmax:   86.50 cm/s LVOT Vmean:  53.400 cm/s LVOT VTI:    0.204 m   AORTA Ao Root diam: 3.00 cm Ao Asc diam:  2.80 cm  MITRAL VALVE MV Area (PHT): 4.06 cm    SHUNTS MV Decel Time: 187 msec    Systemic VTI:  0.20 m MV E velocity: 78.60 cm/s  Systemic Diam: 2.10 cm MV A velocity: 78.60 cm/s MV E/A ratio:  1.00  Arvilla Meres MD Electronically signed by Arvilla Meres MD Signature Date/Time: 04/02/2023/12:09:52 PM       Final     *Note: Due to a large number of results and/or encounters for the requested time period, some results have not been displayed. A complete set of results can be found in Results Review.   Imaging Orders  DG Chest Port 1 View         DG Abd 1 View         Portable Chest xray         DG Abd 1 View         DG Abd 1 View         DG Abd 1 View         CT ABDOMEN PELVIS WO CONTRAST         DG Chest Port 1 View         DG Abd 1 View         DG Abd Portable 1V         DG Naso G Tube Plc W/Fl W/Rad         DG Chest Port 1 View         DG Chest Port 1 View         DG Chest Port 1 View         DG Chest Port 1 View         DG Abd 1 View         DG Abd 1 View         CT HEAD CODE STROKE WO CONTRAST         MR BRAIN WO CONTRAST         DG CHEST PORT 1 VIEW         DG Abd 1 View         IR GASTROSTOMY TUBE MOD SED         DG Abd Portable 1V         IR Fluoro Guide CV Line Right         CT ABDOMEN WO CONTRAST         DG CHEST PORT 1 VIEW         IR US Guide Vasc Access Right         DG CHEST PORT 1 VIEW         IR Radiologist Eval & Mgmt         IR Fluoro Guide CV Line Right         VAS [place] LOWER EXTREMITY VENOUS (DVT)    Lab Orders         MRSA Next Gen by PCR, Nasal         Urine Culture (for  pregnant, neutropenic or urologic patients or patients with an indwelling urinary catheter)         Culture, Respiratory w Gram Stain         Culture, Respiratory w Gram Stain         MRSA Next Gen by PCR, Nasal         Culture, Respiratory w Gram Stain         Culture, blood (Routine X 2) w Reflex to ID Panel         Culture, Respiratory w Gram Stain         HIV Antibody (routine testing w rflx)         CBC         Comprehensive metabolic panel         Magnesium         Phosphorus         Brain natriuretic peptide         Blood gas, arterial         Basic metabolic panel  Glucose, capillary         Urinalysis, Routine w reflex microscopic -Urine, Catheterized         Procalcitonin         Procalcitonin         Magnesium         Glucose, capillary         Glucose, capillary         Glucose, capillary         CBC         Glucose, capillary         CBC         Procalcitonin         Magnesium         Glucose, capillary         Glucose, capillary         Hemoglobin and hematocrit, blood         Glucose, capillary         Glucose, capillary         Glucose, capillary         Glucose, capillary         Cooxemetry Panel (carboxy, met, total hgb, O2 sat)         Glucose, capillary         Glucose, capillary         Glucose, capillary         Glucose, capillary         Glucose, capillary         Glucose, capillary         Glucose, capillary         Procalcitonin         Brain natriuretic peptide         Glucose, capillary         Renal function panel         Phosphorus         Glucose, capillary         Low molecular wgt heparin (fractionated) (hepr anti-XA)         Glucose, capillary         Basic metabolic panel         Glucose, capillary         Glucose, capillary         Glucose, capillary         Glucose, capillary         Glucose, capillary         Glucose, capillary         CBC with Differential/Platelet         Hepatic function panel         Glucose,  capillary         Glucose, capillary         Glucose, capillary         Glucose, capillary         CBC with Differential/Platelet         Renal function panel         Glucose, capillary         Glucose, capillary         Glucose, capillary         Glucose, capillary         Glucose, capillary         Renal function panel         CBC         Glucose, capillary  Glucose, capillary         Glucose, capillary         Glucose, capillary         Glucose, capillary         Glucose, capillary         Glucose, capillary         Glucose, capillary         Glucose, capillary         Glucose, capillary         Glucose, capillary         Glucose, capillary         Glucose, capillary         Glucose, capillary         Glucose, capillary         Glucose, capillary         Glucose, capillary         CBC         Glucose, capillary         Glucose, capillary         Glucose, capillary         Glucose, capillary         Glucose, capillary         Renal function panel         CBC with Differential/Platelet         Magnesium         Phosphorus         Glucose, capillary         CBC         Basic metabolic panel         Magnesium         Phosphorus         Lactic acid, plasma         Procalcitonin         CBC         Renal function panel         Glucose, capillary         Hepatitis B surface antigen         Hepatitis B surface antibody,quantitative         Glucose, capillary         Glucose, capillary         Glucose, capillary         Glucose, capillary         Magnesium         Glucose, capillary         CBC with Differential/Platelet         Basic metabolic panel         Glucose, capillary         Glucose, capillary         Glucose, capillary         Glucose, capillary         Glucose, capillary         Hemoglobin and hematocrit, blood         Glucose, capillary         Glucose, capillary         CBC with Differential/Platelet         Basic metabolic panel          Magnesium         Phosphorus         Glucose, capillary         Glucose, capillary  Hemoglobin and hematocrit, blood         Glucose, capillary         Glucose, capillary         Glucose, capillary         Glucose, capillary         Glucose, capillary         Hemoglobin A1c         CBC with Differential/Platelet         Basic metabolic panel         Glucose, capillary         Glucose, capillary         Glucose, capillary         Glucose, capillary         Glucose, capillary         Glucose, capillary         Glucose, capillary         Glucose, capillary         Glucose, capillary         CBC         Basic metabolic panel         Magnesium         Glucose, capillary         Glucose, capillary         Glucose, capillary         Glucose, capillary         Glucose, capillary         Glucose, capillary         Basic metabolic panel         Magnesium         CBC         Glucose, capillary         DIC Panel ONCE - STAT         Heparin induced platelet Ab (HIT antibody)         CBC         DIC Panel ONCE - STAT         Glucose, capillary         Glucose, capillary         CBC         Reticulocytes         Glucose, capillary         Glucose, capillary         Glucose, capillary         Glucose, capillary         Lactate dehydrogenase         Haptoglobin         Cooxemetry Panel (carboxy, met, total hgb, O2 sat)         Comprehensive metabolic panel         Glucose, capillary         Glucose, capillary         Glucose, capillary         Glucose, capillary         CBC         DIC Panel Once         Haptoglobin         Glucose, capillary         Glucose, capillary         Comprehensive metabolic panel         Lactate dehydrogenase         Glucose, capillary  Basic metabolic panel         Glucose, capillary         Glucose, capillary         Glucose, capillary         Glucose, capillary         Glucose, capillary         Glucose, capillary          Glucose, capillary         Glucose, capillary         Glucose, capillary         Glucose, capillary         Glucose, capillary         Glucose, capillary         Glucose, capillary         Protime-INR         Glucose, capillary         Glucose, capillary         Glucose, capillary         Glucose, capillary         Iron and TIBC         Ferritin         CBC         Glucose, capillary         Glucose, capillary         Glucose, capillary         Glucose, capillary         Glucose, capillary         Glucose, capillary         Glucose, capillary         Glucose, capillary         Glucose, capillary         Glucose, capillary         Glucose, capillary         Magnesium         Phosphorus         Glucose, capillary         Glucose, capillary         CBC         Renal function panel         Magnesium         Glucose, capillary         Glucose, capillary         Glucose, capillary         Glucose, capillary         Glucose, capillary         Glucose, capillary         Blood gas, venous         Glucose, capillary         Glucose, capillary         Glucose, capillary         Glucose, capillary         Glucose, capillary         Glucose, capillary         Glucose, capillary         Glucose, capillary         Glucose, capillary         Glucose, capillary         CBC         Renal function panel     Discharge Instructions:   Discharge Instructions      To [name]  or their caretakers,  They were admitted to [facility] on 04/01/2023 for evaluation and treatment of:  Active Problems:   Pressure injury of skin   Acute renal failure (HCC)   COPD (chronic obstructive pulmonary disease) (HCC)   Tobacco abuse   (HFpEF) heart failure with preserved ejection fraction (HCC)   CAD (coronary artery disease)   Anemia   Depression   Anxiety   Tracheostomy status (HCC)   Acute on chronic respiratory failure with hypoxia  (HCC)  Principal Problem (Resolved):   Acute hypoxemic respiratory failure (HCC) Resolved Problems:   Aspiration pneumonia (HCC)   CAP (community acquired pneumonia)   Septic shock (HCC)   Ileus (HCC)   Volume overload   COPD with acute exacerbation (HCC)   Acute heart failure with preserved ejection fraction (HCC)   Leukocytosis   Elevated troponin   Metabolic acidosis   Thrombocytopenia (HCC)   Acute encephalopathy  They were discharged from the hospital on 04/28/23. I recommend the following after leaving the hospital:   Acute on chronic hypoxic and hypercapnic respiratory failure - tracheostomy in place - tolerating extended trach collar time but gets exhausted with speech therapy and dialysis - she should probably be on pressure support during dialysis - continue lama/laba/ics via neb - work toward speaking valve  ATN with AKI and progression to renal failure - HD dependent, MWF - HD cath is positional and has had some flow issues, nephrology recommends replacement  Anxiety, neuropathy, insomnia, tremor - lexapro - gabapentin - klonopin - ramelteon  Anemia of critical illness - limit phlebotomy - iron replete - aranesp q weekly  Dysphagia - gastrostomy in place - tube feeding without complications  Stage 1 pressure injury left buttock - frequent turns  Marrianne Mood MD 04/28/2023, 10:39 AM        Marrianne Mood MD 04/28/2023, 10:57 AM

## 2023-04-29 DIAGNOSIS — N186 End stage renal disease: Secondary | ICD-10-CM | POA: Diagnosis not present

## 2023-04-29 DIAGNOSIS — J690 Pneumonitis due to inhalation of food and vomit: Secondary | ICD-10-CM | POA: Diagnosis not present

## 2023-04-29 DIAGNOSIS — J9621 Acute and chronic respiratory failure with hypoxia: Secondary | ICD-10-CM | POA: Diagnosis not present

## 2023-04-29 DIAGNOSIS — J9620 Acute and chronic respiratory failure, unspecified whether with hypoxia or hypercapnia: Secondary | ICD-10-CM | POA: Diagnosis not present

## 2023-04-29 DIAGNOSIS — J449 Chronic obstructive pulmonary disease, unspecified: Secondary | ICD-10-CM | POA: Diagnosis not present

## 2023-04-29 DIAGNOSIS — I5030 Unspecified diastolic (congestive) heart failure: Secondary | ICD-10-CM | POA: Diagnosis not present

## 2023-04-29 DIAGNOSIS — Z930 Tracheostomy status: Secondary | ICD-10-CM | POA: Diagnosis not present

## 2023-04-29 LAB — C DIFFICILE QUICK SCREEN W PCR REFLEX
C Diff antigen: NEGATIVE
C Diff interpretation: NOT DETECTED
C Diff toxin: NEGATIVE

## 2023-04-30 DIAGNOSIS — J9620 Acute and chronic respiratory failure, unspecified whether with hypoxia or hypercapnia: Secondary | ICD-10-CM | POA: Diagnosis not present

## 2023-04-30 DIAGNOSIS — D631 Anemia in chronic kidney disease: Secondary | ICD-10-CM | POA: Diagnosis not present

## 2023-04-30 DIAGNOSIS — J9600 Acute respiratory failure, unspecified whether with hypoxia or hypercapnia: Secondary | ICD-10-CM | POA: Diagnosis not present

## 2023-04-30 DIAGNOSIS — N179 Acute kidney failure, unspecified: Secondary | ICD-10-CM | POA: Diagnosis not present

## 2023-05-01 DIAGNOSIS — J9620 Acute and chronic respiratory failure, unspecified whether with hypoxia or hypercapnia: Secondary | ICD-10-CM | POA: Diagnosis not present

## 2023-05-01 LAB — HEPATITIS B SURFACE ANTIGEN: Hepatitis B Surface Ag: NONREACTIVE

## 2023-05-02 DIAGNOSIS — J690 Pneumonitis due to inhalation of food and vomit: Secondary | ICD-10-CM | POA: Diagnosis not present

## 2023-05-02 DIAGNOSIS — N179 Acute kidney failure, unspecified: Secondary | ICD-10-CM | POA: Diagnosis not present

## 2023-05-02 DIAGNOSIS — J9621 Acute and chronic respiratory failure with hypoxia: Secondary | ICD-10-CM | POA: Diagnosis not present

## 2023-05-02 DIAGNOSIS — N2581 Secondary hyperparathyroidism of renal origin: Secondary | ICD-10-CM | POA: Diagnosis not present

## 2023-05-02 DIAGNOSIS — J9600 Acute respiratory failure, unspecified whether with hypoxia or hypercapnia: Secondary | ICD-10-CM | POA: Diagnosis not present

## 2023-05-02 DIAGNOSIS — Z930 Tracheostomy status: Secondary | ICD-10-CM | POA: Diagnosis not present

## 2023-05-02 DIAGNOSIS — J449 Chronic obstructive pulmonary disease, unspecified: Secondary | ICD-10-CM | POA: Diagnosis not present

## 2023-05-02 DIAGNOSIS — D631 Anemia in chronic kidney disease: Secondary | ICD-10-CM | POA: Diagnosis not present

## 2023-05-02 DIAGNOSIS — N186 End stage renal disease: Secondary | ICD-10-CM | POA: Diagnosis not present

## 2023-05-02 DIAGNOSIS — J9620 Acute and chronic respiratory failure, unspecified whether with hypoxia or hypercapnia: Secondary | ICD-10-CM | POA: Diagnosis not present

## 2023-05-02 LAB — HEPATITIS B E ANTIBODY: Hep B E Ab: NONREACTIVE

## 2023-05-03 DIAGNOSIS — I5030 Unspecified diastolic (congestive) heart failure: Secondary | ICD-10-CM | POA: Diagnosis not present

## 2023-05-03 DIAGNOSIS — J690 Pneumonitis due to inhalation of food and vomit: Secondary | ICD-10-CM | POA: Diagnosis not present

## 2023-05-03 DIAGNOSIS — J449 Chronic obstructive pulmonary disease, unspecified: Secondary | ICD-10-CM | POA: Diagnosis not present

## 2023-05-03 DIAGNOSIS — Z930 Tracheostomy status: Secondary | ICD-10-CM | POA: Diagnosis not present

## 2023-05-03 DIAGNOSIS — J9621 Acute and chronic respiratory failure with hypoxia: Secondary | ICD-10-CM | POA: Diagnosis not present

## 2023-05-03 DIAGNOSIS — J9620 Acute and chronic respiratory failure, unspecified whether with hypoxia or hypercapnia: Secondary | ICD-10-CM | POA: Diagnosis not present

## 2023-05-03 DIAGNOSIS — N186 End stage renal disease: Secondary | ICD-10-CM | POA: Diagnosis not present

## 2023-05-03 LAB — BASIC METABOLIC PANEL
Anion gap: 11 (ref 5–15)
BUN: 28 mg/dL — ABNORMAL HIGH (ref 8–23)
CO2: 27 mmol/L (ref 22–32)
Calcium: 8.8 mg/dL — ABNORMAL LOW (ref 8.9–10.3)
Chloride: 99 mmol/L (ref 98–111)
Creatinine, Ser: 2.81 mg/dL — ABNORMAL HIGH (ref 0.44–1.00)
GFR, Estimated: 18 mL/min — ABNORMAL LOW (ref >60)
Glucose, Bld: 120 mg/dL — ABNORMAL HIGH (ref 70–99)
Potassium: 3.6 mmol/L (ref 3.5–5.1)
Sodium: 137 mmol/L (ref 135–145)

## 2023-05-03 LAB — CBC WITH DIFFERENTIAL/PLATELET
Abs Immature Granulocytes: 0.07 10*3/uL (ref 0.00–0.07)
Basophils Absolute: 0 10*3/uL (ref 0.0–0.1)
Basophils Relative: 1 %
Eosinophils Absolute: 0.1 10*3/uL (ref 0.0–0.5)
Eosinophils Relative: 1 %
HCT: 27.4 % — ABNORMAL LOW (ref 36.0–46.0)
Hemoglobin: 8.3 g/dL — ABNORMAL LOW (ref 12.0–15.0)
Immature Granulocytes: 1 %
Lymphocytes Relative: 12 %
Lymphs Abs: 1 10*3/uL (ref 0.7–4.0)
MCH: 31 pg (ref 26.0–34.0)
MCHC: 30.3 g/dL (ref 30.0–36.0)
MCV: 102.2 fL — ABNORMAL HIGH (ref 80.0–100.0)
Monocytes Absolute: 0.9 10*3/uL (ref 0.1–1.0)
Monocytes Relative: 11 %
Neutro Abs: 6.2 10*3/uL (ref 1.7–7.7)
Neutrophils Relative %: 74 %
Platelets: 205 10*3/uL (ref 150–400)
RBC: 2.68 MIL/uL — ABNORMAL LOW (ref 3.87–5.11)
RDW: 21.1 % — ABNORMAL HIGH (ref 11.5–15.5)
WBC: 8.3 10*3/uL (ref 4.0–10.5)
nRBC: 0 % (ref 0.0–0.2)

## 2023-05-04 DIAGNOSIS — J9620 Acute and chronic respiratory failure, unspecified whether with hypoxia or hypercapnia: Secondary | ICD-10-CM | POA: Diagnosis not present

## 2023-05-04 DIAGNOSIS — J690 Pneumonitis due to inhalation of food and vomit: Secondary | ICD-10-CM | POA: Diagnosis not present

## 2023-05-04 DIAGNOSIS — J9621 Acute and chronic respiratory failure with hypoxia: Secondary | ICD-10-CM | POA: Diagnosis not present

## 2023-05-04 DIAGNOSIS — I5030 Unspecified diastolic (congestive) heart failure: Secondary | ICD-10-CM | POA: Diagnosis not present

## 2023-05-04 DIAGNOSIS — Z930 Tracheostomy status: Secondary | ICD-10-CM | POA: Diagnosis not present

## 2023-05-04 DIAGNOSIS — N186 End stage renal disease: Secondary | ICD-10-CM | POA: Diagnosis not present

## 2023-05-04 DIAGNOSIS — J449 Chronic obstructive pulmonary disease, unspecified: Secondary | ICD-10-CM | POA: Diagnosis not present

## 2023-05-05 ENCOUNTER — Other Ambulatory Visit (HOSPITAL_COMMUNITY): Payer: 59

## 2023-05-05 DIAGNOSIS — D631 Anemia in chronic kidney disease: Secondary | ICD-10-CM | POA: Diagnosis not present

## 2023-05-05 DIAGNOSIS — J9600 Acute respiratory failure, unspecified whether with hypoxia or hypercapnia: Secondary | ICD-10-CM | POA: Diagnosis not present

## 2023-05-05 DIAGNOSIS — N186 End stage renal disease: Secondary | ICD-10-CM | POA: Diagnosis not present

## 2023-05-05 DIAGNOSIS — I5030 Unspecified diastolic (congestive) heart failure: Secondary | ICD-10-CM | POA: Diagnosis not present

## 2023-05-05 DIAGNOSIS — J9620 Acute and chronic respiratory failure, unspecified whether with hypoxia or hypercapnia: Secondary | ICD-10-CM | POA: Diagnosis not present

## 2023-05-05 DIAGNOSIS — J449 Chronic obstructive pulmonary disease, unspecified: Secondary | ICD-10-CM | POA: Diagnosis not present

## 2023-05-05 DIAGNOSIS — N2581 Secondary hyperparathyroidism of renal origin: Secondary | ICD-10-CM | POA: Diagnosis not present

## 2023-05-05 DIAGNOSIS — J9621 Acute and chronic respiratory failure with hypoxia: Secondary | ICD-10-CM | POA: Diagnosis not present

## 2023-05-05 DIAGNOSIS — Z930 Tracheostomy status: Secondary | ICD-10-CM | POA: Diagnosis not present

## 2023-05-06 ENCOUNTER — Other Ambulatory Visit (HOSPITAL_COMMUNITY): Payer: 59

## 2023-05-06 DIAGNOSIS — I5030 Unspecified diastolic (congestive) heart failure: Secondary | ICD-10-CM | POA: Diagnosis not present

## 2023-05-06 DIAGNOSIS — J9621 Acute and chronic respiratory failure with hypoxia: Secondary | ICD-10-CM | POA: Diagnosis not present

## 2023-05-06 DIAGNOSIS — N186 End stage renal disease: Secondary | ICD-10-CM | POA: Diagnosis not present

## 2023-05-06 DIAGNOSIS — Z930 Tracheostomy status: Secondary | ICD-10-CM | POA: Diagnosis not present

## 2023-05-06 DIAGNOSIS — J449 Chronic obstructive pulmonary disease, unspecified: Secondary | ICD-10-CM | POA: Diagnosis not present

## 2023-05-06 DIAGNOSIS — J9620 Acute and chronic respiratory failure, unspecified whether with hypoxia or hypercapnia: Secondary | ICD-10-CM | POA: Diagnosis not present

## 2023-05-06 DIAGNOSIS — J690 Pneumonitis due to inhalation of food and vomit: Secondary | ICD-10-CM | POA: Diagnosis not present

## 2023-05-06 LAB — CBC WITH DIFFERENTIAL/PLATELET
Abs Immature Granulocytes: 0.06 10*3/uL (ref 0.00–0.07)
Basophils Absolute: 0 10*3/uL (ref 0.0–0.1)
Basophils Relative: 0 %
Eosinophils Absolute: 0.2 10*3/uL (ref 0.0–0.5)
Eosinophils Relative: 3 %
HCT: 30.9 % — ABNORMAL LOW (ref 36.0–46.0)
Hemoglobin: 9 g/dL — ABNORMAL LOW (ref 12.0–15.0)
Immature Granulocytes: 1 %
Lymphocytes Relative: 18 %
Lymphs Abs: 1.6 10*3/uL (ref 0.7–4.0)
MCH: 30.5 pg (ref 26.0–34.0)
MCHC: 29.1 g/dL — ABNORMAL LOW (ref 30.0–36.0)
MCV: 104.7 fL — ABNORMAL HIGH (ref 80.0–100.0)
Monocytes Absolute: 0.8 10*3/uL (ref 0.1–1.0)
Monocytes Relative: 9 %
Neutro Abs: 6.3 10*3/uL (ref 1.7–7.7)
Neutrophils Relative %: 69 %
Platelets: 239 10*3/uL (ref 150–400)
RBC: 2.95 MIL/uL — ABNORMAL LOW (ref 3.87–5.11)
RDW: 20.5 % — ABNORMAL HIGH (ref 11.5–15.5)
WBC: 9 10*3/uL (ref 4.0–10.5)
nRBC: 0 % (ref 0.0–0.2)

## 2023-05-06 LAB — PHOSPHORUS: Phosphorus: 4.2 mg/dL (ref 2.5–4.6)

## 2023-05-06 LAB — BASIC METABOLIC PANEL
Anion gap: 11 (ref 5–15)
BUN: 55 mg/dL — ABNORMAL HIGH (ref 8–23)
CO2: 25 mmol/L (ref 22–32)
Calcium: 9.1 mg/dL (ref 8.9–10.3)
Chloride: 99 mmol/L (ref 98–111)
Creatinine, Ser: 5.06 mg/dL — ABNORMAL HIGH (ref 0.44–1.00)
GFR, Estimated: 9 mL/min — ABNORMAL LOW (ref >60)
Glucose, Bld: 99 mg/dL (ref 70–99)
Potassium: 4.8 mmol/L (ref 3.5–5.1)
Sodium: 135 mmol/L (ref 135–145)

## 2023-05-06 LAB — MAGNESIUM: Magnesium: 2.3 mg/dL (ref 1.7–2.4)

## 2023-05-06 LAB — LACTIC ACID, PLASMA: Lactic Acid, Venous: 0.9 mmol/L (ref 0.5–1.9)

## 2023-05-07 DIAGNOSIS — N186 End stage renal disease: Secondary | ICD-10-CM | POA: Diagnosis not present

## 2023-05-07 DIAGNOSIS — J9620 Acute and chronic respiratory failure, unspecified whether with hypoxia or hypercapnia: Secondary | ICD-10-CM | POA: Diagnosis not present

## 2023-05-07 DIAGNOSIS — J9621 Acute and chronic respiratory failure with hypoxia: Secondary | ICD-10-CM | POA: Diagnosis not present

## 2023-05-07 DIAGNOSIS — N179 Acute kidney failure, unspecified: Secondary | ICD-10-CM | POA: Diagnosis not present

## 2023-05-07 DIAGNOSIS — N2581 Secondary hyperparathyroidism of renal origin: Secondary | ICD-10-CM | POA: Diagnosis not present

## 2023-05-07 DIAGNOSIS — J449 Chronic obstructive pulmonary disease, unspecified: Secondary | ICD-10-CM | POA: Diagnosis not present

## 2023-05-07 DIAGNOSIS — D631 Anemia in chronic kidney disease: Secondary | ICD-10-CM | POA: Diagnosis not present

## 2023-05-07 DIAGNOSIS — J9600 Acute respiratory failure, unspecified whether with hypoxia or hypercapnia: Secondary | ICD-10-CM | POA: Diagnosis not present

## 2023-05-07 DIAGNOSIS — I5030 Unspecified diastolic (congestive) heart failure: Secondary | ICD-10-CM | POA: Diagnosis not present

## 2023-05-07 DIAGNOSIS — J690 Pneumonitis due to inhalation of food and vomit: Secondary | ICD-10-CM | POA: Diagnosis not present

## 2023-05-07 DIAGNOSIS — Z930 Tracheostomy status: Secondary | ICD-10-CM | POA: Diagnosis not present

## 2023-05-08 ENCOUNTER — Other Ambulatory Visit (HOSPITAL_COMMUNITY): Payer: 59

## 2023-05-08 DIAGNOSIS — J9621 Acute and chronic respiratory failure with hypoxia: Secondary | ICD-10-CM | POA: Diagnosis not present

## 2023-05-08 DIAGNOSIS — Z930 Tracheostomy status: Secondary | ICD-10-CM | POA: Diagnosis not present

## 2023-05-08 DIAGNOSIS — I5030 Unspecified diastolic (congestive) heart failure: Secondary | ICD-10-CM | POA: Diagnosis not present

## 2023-05-08 DIAGNOSIS — J9620 Acute and chronic respiratory failure, unspecified whether with hypoxia or hypercapnia: Secondary | ICD-10-CM | POA: Diagnosis not present

## 2023-05-08 DIAGNOSIS — J690 Pneumonitis due to inhalation of food and vomit: Secondary | ICD-10-CM | POA: Diagnosis not present

## 2023-05-08 DIAGNOSIS — N186 End stage renal disease: Secondary | ICD-10-CM | POA: Diagnosis not present

## 2023-05-08 DIAGNOSIS — J449 Chronic obstructive pulmonary disease, unspecified: Secondary | ICD-10-CM | POA: Diagnosis not present

## 2023-05-08 LAB — CBC WITH DIFFERENTIAL/PLATELET
Abs Immature Granulocytes: 0.03 10*3/uL (ref 0.00–0.07)
Basophils Absolute: 0 10*3/uL (ref 0.0–0.1)
Basophils Relative: 1 %
Eosinophils Absolute: 0.2 10*3/uL (ref 0.0–0.5)
Eosinophils Relative: 3 %
HCT: 28.8 % — ABNORMAL LOW (ref 36.0–46.0)
Hemoglobin: 8.5 g/dL — ABNORMAL LOW (ref 12.0–15.0)
Immature Granulocytes: 1 %
Lymphocytes Relative: 29 %
Lymphs Abs: 1.9 10*3/uL (ref 0.7–4.0)
MCH: 30.8 pg (ref 26.0–34.0)
MCHC: 29.5 g/dL — ABNORMAL LOW (ref 30.0–36.0)
MCV: 104.3 fL — ABNORMAL HIGH (ref 80.0–100.0)
Monocytes Absolute: 0.9 10*3/uL (ref 0.1–1.0)
Monocytes Relative: 13 %
Neutro Abs: 3.6 10*3/uL (ref 1.7–7.7)
Neutrophils Relative %: 53 %
Platelets: 201 10*3/uL (ref 150–400)
RBC: 2.76 MIL/uL — ABNORMAL LOW (ref 3.87–5.11)
RDW: 19.7 % — ABNORMAL HIGH (ref 11.5–15.5)
WBC: 6.7 10*3/uL (ref 4.0–10.5)
nRBC: 0 % (ref 0.0–0.2)

## 2023-05-08 LAB — BASIC METABOLIC PANEL
Anion gap: 12 (ref 5–15)
BUN: 27 mg/dL — ABNORMAL HIGH (ref 8–23)
CO2: 21 mmol/L — ABNORMAL LOW (ref 22–32)
Calcium: 8.2 mg/dL — ABNORMAL LOW (ref 8.9–10.3)
Chloride: 99 mmol/L (ref 98–111)
Creatinine, Ser: 4.37 mg/dL — ABNORMAL HIGH (ref 0.44–1.00)
GFR, Estimated: 11 mL/min — ABNORMAL LOW (ref >60)
Glucose, Bld: 88 mg/dL (ref 70–99)
Potassium: 3.6 mmol/L (ref 3.5–5.1)
Sodium: 132 mmol/L — ABNORMAL LOW (ref 135–145)

## 2023-05-09 ENCOUNTER — Other Ambulatory Visit (HOSPITAL_COMMUNITY): Payer: 59

## 2023-05-09 DIAGNOSIS — D631 Anemia in chronic kidney disease: Secondary | ICD-10-CM | POA: Diagnosis not present

## 2023-05-09 DIAGNOSIS — J9620 Acute and chronic respiratory failure, unspecified whether with hypoxia or hypercapnia: Secondary | ICD-10-CM | POA: Diagnosis not present

## 2023-05-09 DIAGNOSIS — N186 End stage renal disease: Secondary | ICD-10-CM | POA: Diagnosis not present

## 2023-05-09 DIAGNOSIS — N179 Acute kidney failure, unspecified: Secondary | ICD-10-CM | POA: Diagnosis not present

## 2023-05-09 DIAGNOSIS — N2581 Secondary hyperparathyroidism of renal origin: Secondary | ICD-10-CM | POA: Diagnosis not present

## 2023-05-09 DIAGNOSIS — J690 Pneumonitis due to inhalation of food and vomit: Secondary | ICD-10-CM | POA: Diagnosis not present

## 2023-05-09 DIAGNOSIS — J9621 Acute and chronic respiratory failure with hypoxia: Secondary | ICD-10-CM | POA: Diagnosis not present

## 2023-05-09 DIAGNOSIS — J9600 Acute respiratory failure, unspecified whether with hypoxia or hypercapnia: Secondary | ICD-10-CM | POA: Diagnosis not present

## 2023-05-09 DIAGNOSIS — J449 Chronic obstructive pulmonary disease, unspecified: Secondary | ICD-10-CM | POA: Diagnosis not present

## 2023-05-09 DIAGNOSIS — I5030 Unspecified diastolic (congestive) heart failure: Secondary | ICD-10-CM | POA: Diagnosis not present

## 2023-05-09 DIAGNOSIS — Z930 Tracheostomy status: Secondary | ICD-10-CM | POA: Diagnosis not present

## 2023-05-10 DIAGNOSIS — I5030 Unspecified diastolic (congestive) heart failure: Secondary | ICD-10-CM | POA: Diagnosis not present

## 2023-05-10 DIAGNOSIS — Z930 Tracheostomy status: Secondary | ICD-10-CM | POA: Diagnosis not present

## 2023-05-10 DIAGNOSIS — J9620 Acute and chronic respiratory failure, unspecified whether with hypoxia or hypercapnia: Secondary | ICD-10-CM | POA: Diagnosis not present

## 2023-05-10 DIAGNOSIS — J449 Chronic obstructive pulmonary disease, unspecified: Secondary | ICD-10-CM | POA: Diagnosis not present

## 2023-05-10 DIAGNOSIS — N186 End stage renal disease: Secondary | ICD-10-CM | POA: Diagnosis not present

## 2023-05-10 DIAGNOSIS — J690 Pneumonitis due to inhalation of food and vomit: Secondary | ICD-10-CM | POA: Diagnosis not present

## 2023-05-10 DIAGNOSIS — J9621 Acute and chronic respiratory failure with hypoxia: Secondary | ICD-10-CM | POA: Diagnosis not present

## 2023-05-10 LAB — CBC WITH DIFFERENTIAL/PLATELET
Abs Immature Granulocytes: 0.01 10*3/uL (ref 0.00–0.07)
Basophils Absolute: 0 10*3/uL (ref 0.0–0.1)
Basophils Relative: 1 %
Eosinophils Absolute: 0.2 10*3/uL (ref 0.0–0.5)
Eosinophils Relative: 3 %
HCT: 29.5 % — ABNORMAL LOW (ref 36.0–46.0)
Hemoglobin: 8.6 g/dL — ABNORMAL LOW (ref 12.0–15.0)
Immature Granulocytes: 0 %
Lymphocytes Relative: 32 %
Lymphs Abs: 1.9 10*3/uL (ref 0.7–4.0)
MCH: 29.5 pg (ref 26.0–34.0)
MCHC: 29.2 g/dL — ABNORMAL LOW (ref 30.0–36.0)
MCV: 101 fL — ABNORMAL HIGH (ref 80.0–100.0)
Monocytes Absolute: 0.8 10*3/uL (ref 0.1–1.0)
Monocytes Relative: 13 %
Neutro Abs: 3.2 10*3/uL (ref 1.7–7.7)
Neutrophils Relative %: 51 %
Platelets: 214 10*3/uL (ref 150–400)
RBC: 2.92 MIL/uL — ABNORMAL LOW (ref 3.87–5.11)
RDW: 18.6 % — ABNORMAL HIGH (ref 11.5–15.5)
WBC: 6.1 10*3/uL (ref 4.0–10.5)
nRBC: 0 % (ref 0.0–0.2)

## 2023-05-10 LAB — BASIC METABOLIC PANEL
Anion gap: 11 (ref 5–15)
BUN: 16 mg/dL (ref 8–23)
CO2: 24 mmol/L (ref 22–32)
Calcium: 8.6 mg/dL — ABNORMAL LOW (ref 8.9–10.3)
Chloride: 96 mmol/L — ABNORMAL LOW (ref 98–111)
Creatinine, Ser: 4.16 mg/dL — ABNORMAL HIGH (ref 0.44–1.00)
GFR, Estimated: 12 mL/min — ABNORMAL LOW (ref >60)
Glucose, Bld: 95 mg/dL (ref 70–99)
Potassium: 3.6 mmol/L (ref 3.5–5.1)
Sodium: 131 mmol/L — ABNORMAL LOW (ref 135–145)

## 2023-05-11 DIAGNOSIS — J690 Pneumonitis due to inhalation of food and vomit: Secondary | ICD-10-CM | POA: Diagnosis not present

## 2023-05-11 DIAGNOSIS — J9620 Acute and chronic respiratory failure, unspecified whether with hypoxia or hypercapnia: Secondary | ICD-10-CM | POA: Diagnosis not present

## 2023-05-11 DIAGNOSIS — J9621 Acute and chronic respiratory failure with hypoxia: Secondary | ICD-10-CM | POA: Diagnosis not present

## 2023-05-11 DIAGNOSIS — N186 End stage renal disease: Secondary | ICD-10-CM | POA: Diagnosis not present

## 2023-05-11 DIAGNOSIS — Z930 Tracheostomy status: Secondary | ICD-10-CM | POA: Diagnosis not present

## 2023-05-11 DIAGNOSIS — J449 Chronic obstructive pulmonary disease, unspecified: Secondary | ICD-10-CM | POA: Diagnosis not present

## 2023-05-11 DIAGNOSIS — I5030 Unspecified diastolic (congestive) heart failure: Secondary | ICD-10-CM | POA: Diagnosis not present

## 2023-05-12 ENCOUNTER — Other Ambulatory Visit (HOSPITAL_COMMUNITY): Payer: 59

## 2023-05-12 DIAGNOSIS — I5030 Unspecified diastolic (congestive) heart failure: Secondary | ICD-10-CM

## 2023-05-12 DIAGNOSIS — Z930 Tracheostomy status: Secondary | ICD-10-CM | POA: Diagnosis not present

## 2023-05-12 DIAGNOSIS — J449 Chronic obstructive pulmonary disease, unspecified: Secondary | ICD-10-CM | POA: Diagnosis not present

## 2023-05-12 DIAGNOSIS — N2581 Secondary hyperparathyroidism of renal origin: Secondary | ICD-10-CM | POA: Diagnosis not present

## 2023-05-12 DIAGNOSIS — J690 Pneumonitis due to inhalation of food and vomit: Secondary | ICD-10-CM | POA: Diagnosis not present

## 2023-05-12 DIAGNOSIS — J9620 Acute and chronic respiratory failure, unspecified whether with hypoxia or hypercapnia: Secondary | ICD-10-CM | POA: Diagnosis not present

## 2023-05-12 DIAGNOSIS — N186 End stage renal disease: Secondary | ICD-10-CM

## 2023-05-12 DIAGNOSIS — J9600 Acute respiratory failure, unspecified whether with hypoxia or hypercapnia: Secondary | ICD-10-CM | POA: Diagnosis not present

## 2023-05-12 DIAGNOSIS — D631 Anemia in chronic kidney disease: Secondary | ICD-10-CM | POA: Diagnosis not present

## 2023-05-12 DIAGNOSIS — J9621 Acute and chronic respiratory failure with hypoxia: Secondary | ICD-10-CM | POA: Diagnosis not present

## 2023-05-12 DIAGNOSIS — N179 Acute kidney failure, unspecified: Secondary | ICD-10-CM | POA: Diagnosis not present

## 2023-05-13 ENCOUNTER — Other Ambulatory Visit (HOSPITAL_COMMUNITY): Payer: 59

## 2023-05-13 DIAGNOSIS — J690 Pneumonitis due to inhalation of food and vomit: Secondary | ICD-10-CM | POA: Diagnosis not present

## 2023-05-13 DIAGNOSIS — Z930 Tracheostomy status: Secondary | ICD-10-CM | POA: Diagnosis not present

## 2023-05-13 DIAGNOSIS — J9620 Acute and chronic respiratory failure, unspecified whether with hypoxia or hypercapnia: Secondary | ICD-10-CM | POA: Diagnosis not present

## 2023-05-13 DIAGNOSIS — N186 End stage renal disease: Secondary | ICD-10-CM | POA: Diagnosis not present

## 2023-05-13 DIAGNOSIS — J9621 Acute and chronic respiratory failure with hypoxia: Secondary | ICD-10-CM | POA: Diagnosis not present

## 2023-05-13 DIAGNOSIS — I5030 Unspecified diastolic (congestive) heart failure: Secondary | ICD-10-CM | POA: Diagnosis not present

## 2023-05-13 DIAGNOSIS — J449 Chronic obstructive pulmonary disease, unspecified: Secondary | ICD-10-CM | POA: Diagnosis not present

## 2023-05-13 LAB — CBC WITH DIFFERENTIAL/PLATELET
Abs Immature Granulocytes: 0.01 K/uL (ref 0.00–0.07)
Basophils Absolute: 0 K/uL (ref 0.0–0.1)
Basophils Relative: 1 %
Eosinophils Absolute: 0.2 K/uL (ref 0.0–0.5)
Eosinophils Relative: 3 %
HCT: 31.5 % — ABNORMAL LOW (ref 36.0–46.0)
Hemoglobin: 9.4 g/dL — ABNORMAL LOW (ref 12.0–15.0)
Immature Granulocytes: 0 %
Lymphocytes Relative: 28 %
Lymphs Abs: 1.5 K/uL (ref 0.7–4.0)
MCH: 30.9 pg (ref 26.0–34.0)
MCHC: 29.8 g/dL — ABNORMAL LOW (ref 30.0–36.0)
MCV: 103.6 fL — ABNORMAL HIGH (ref 80.0–100.0)
Monocytes Absolute: 0.7 K/uL (ref 0.1–1.0)
Monocytes Relative: 13 %
Neutro Abs: 2.8 K/uL (ref 1.7–7.7)
Neutrophils Relative %: 55 %
Platelets: 276 K/uL (ref 150–400)
RBC: 3.04 MIL/uL — ABNORMAL LOW (ref 3.87–5.11)
RDW: 17.7 % — ABNORMAL HIGH (ref 11.5–15.5)
WBC: 5.2 K/uL (ref 4.0–10.5)
nRBC: 0 % (ref 0.0–0.2)

## 2023-05-13 LAB — BASIC METABOLIC PANEL WITH GFR
Anion gap: 9 (ref 5–15)
BUN: 9 mg/dL (ref 8–23)
CO2: 24 mmol/L (ref 22–32)
Calcium: 8.4 mg/dL — ABNORMAL LOW (ref 8.9–10.3)
Chloride: 101 mmol/L (ref 98–111)
Creatinine, Ser: 5.17 mg/dL — ABNORMAL HIGH (ref 0.44–1.00)
GFR, Estimated: 9 mL/min — ABNORMAL LOW
Glucose, Bld: 191 mg/dL — ABNORMAL HIGH (ref 70–99)
Potassium: 3.2 mmol/L — ABNORMAL LOW (ref 3.5–5.1)
Sodium: 134 mmol/L — ABNORMAL LOW (ref 135–145)

## 2023-05-14 DIAGNOSIS — I5030 Unspecified diastolic (congestive) heart failure: Secondary | ICD-10-CM | POA: Diagnosis not present

## 2023-05-14 DIAGNOSIS — J9620 Acute and chronic respiratory failure, unspecified whether with hypoxia or hypercapnia: Secondary | ICD-10-CM | POA: Diagnosis not present

## 2023-05-14 DIAGNOSIS — J9600 Acute respiratory failure, unspecified whether with hypoxia or hypercapnia: Secondary | ICD-10-CM | POA: Diagnosis not present

## 2023-05-14 DIAGNOSIS — J9621 Acute and chronic respiratory failure with hypoxia: Secondary | ICD-10-CM | POA: Diagnosis not present

## 2023-05-14 DIAGNOSIS — J690 Pneumonitis due to inhalation of food and vomit: Secondary | ICD-10-CM | POA: Diagnosis not present

## 2023-05-14 DIAGNOSIS — N179 Acute kidney failure, unspecified: Secondary | ICD-10-CM | POA: Diagnosis not present

## 2023-05-14 DIAGNOSIS — J449 Chronic obstructive pulmonary disease, unspecified: Secondary | ICD-10-CM | POA: Diagnosis not present

## 2023-05-14 DIAGNOSIS — D631 Anemia in chronic kidney disease: Secondary | ICD-10-CM | POA: Diagnosis not present

## 2023-05-14 DIAGNOSIS — N2581 Secondary hyperparathyroidism of renal origin: Secondary | ICD-10-CM | POA: Diagnosis not present

## 2023-05-14 DIAGNOSIS — Z930 Tracheostomy status: Secondary | ICD-10-CM | POA: Diagnosis not present

## 2023-05-14 DIAGNOSIS — N186 End stage renal disease: Secondary | ICD-10-CM | POA: Diagnosis not present

## 2023-05-14 LAB — TRIGLYCERIDES: Triglycerides: 83 mg/dL (ref <150)

## 2023-05-15 ENCOUNTER — Other Ambulatory Visit (HOSPITAL_COMMUNITY): Payer: 59

## 2023-05-15 DIAGNOSIS — J9620 Acute and chronic respiratory failure, unspecified whether with hypoxia or hypercapnia: Secondary | ICD-10-CM | POA: Diagnosis not present

## 2023-05-15 DIAGNOSIS — I5030 Unspecified diastolic (congestive) heart failure: Secondary | ICD-10-CM | POA: Diagnosis not present

## 2023-05-15 DIAGNOSIS — J449 Chronic obstructive pulmonary disease, unspecified: Secondary | ICD-10-CM | POA: Diagnosis not present

## 2023-05-15 DIAGNOSIS — N186 End stage renal disease: Secondary | ICD-10-CM | POA: Diagnosis not present

## 2023-05-15 DIAGNOSIS — J690 Pneumonitis due to inhalation of food and vomit: Secondary | ICD-10-CM | POA: Diagnosis not present

## 2023-05-15 DIAGNOSIS — Z930 Tracheostomy status: Secondary | ICD-10-CM | POA: Diagnosis not present

## 2023-05-15 DIAGNOSIS — J9621 Acute and chronic respiratory failure with hypoxia: Secondary | ICD-10-CM | POA: Diagnosis not present

## 2023-05-15 LAB — CBC WITH DIFFERENTIAL/PLATELET
Abs Immature Granulocytes: 0.03 10*3/uL (ref 0.00–0.07)
Basophils Absolute: 0.1 10*3/uL (ref 0.0–0.1)
Basophils Relative: 1 %
Eosinophils Absolute: 0.2 10*3/uL (ref 0.0–0.5)
Eosinophils Relative: 4 %
HCT: 31.3 % — ABNORMAL LOW (ref 36.0–46.0)
Hemoglobin: 9.3 g/dL — ABNORMAL LOW (ref 12.0–15.0)
Immature Granulocytes: 1 %
Lymphocytes Relative: 27 %
Lymphs Abs: 1.7 10*3/uL (ref 0.7–4.0)
MCH: 30 pg (ref 26.0–34.0)
MCHC: 29.7 g/dL — ABNORMAL LOW (ref 30.0–36.0)
MCV: 101 fL — ABNORMAL HIGH (ref 80.0–100.0)
Monocytes Absolute: 0.8 10*3/uL (ref 0.1–1.0)
Monocytes Relative: 13 %
Neutro Abs: 3.3 10*3/uL (ref 1.7–7.7)
Neutrophils Relative %: 54 %
Platelets: 229 10*3/uL (ref 150–400)
RBC: 3.1 MIL/uL — ABNORMAL LOW (ref 3.87–5.11)
RDW: 17.4 % — ABNORMAL HIGH (ref 11.5–15.5)
WBC: 6.1 10*3/uL (ref 4.0–10.5)
nRBC: 0.3 % — ABNORMAL HIGH (ref 0.0–0.2)

## 2023-05-15 LAB — BLOOD GAS, ARTERIAL
Acid-Base Excess: 0.1 mmol/L (ref 0.0–2.0)
Bicarbonate: 27.9 mmol/L (ref 20.0–28.0)
O2 Saturation: 97.1 %
Patient temperature: 37
pCO2 arterial: 58 mmHg — ABNORMAL HIGH (ref 32–48)
pH, Arterial: 7.29 — ABNORMAL LOW (ref 7.35–7.45)
pO2, Arterial: 68 mmHg — ABNORMAL LOW (ref 83–108)

## 2023-05-15 LAB — BASIC METABOLIC PANEL
Anion gap: 9 (ref 5–15)
BUN: 5 mg/dL — ABNORMAL LOW (ref 8–23)
CO2: 24 mmol/L (ref 22–32)
Calcium: 8.6 mg/dL — ABNORMAL LOW (ref 8.9–10.3)
Chloride: 101 mmol/L (ref 98–111)
Creatinine, Ser: 4.99 mg/dL — ABNORMAL HIGH (ref 0.44–1.00)
GFR, Estimated: 9 mL/min — ABNORMAL LOW (ref >60)
Glucose, Bld: 98 mg/dL (ref 70–99)
Potassium: 3.6 mmol/L (ref 3.5–5.1)
Sodium: 134 mmol/L — ABNORMAL LOW (ref 135–145)

## 2023-05-16 ENCOUNTER — Other Ambulatory Visit (HOSPITAL_COMMUNITY): Payer: 59

## 2023-05-16 DIAGNOSIS — I5030 Unspecified diastolic (congestive) heart failure: Secondary | ICD-10-CM | POA: Diagnosis not present

## 2023-05-16 DIAGNOSIS — N186 End stage renal disease: Secondary | ICD-10-CM | POA: Diagnosis not present

## 2023-05-16 DIAGNOSIS — J9600 Acute respiratory failure, unspecified whether with hypoxia or hypercapnia: Secondary | ICD-10-CM | POA: Diagnosis not present

## 2023-05-16 DIAGNOSIS — J9620 Acute and chronic respiratory failure, unspecified whether with hypoxia or hypercapnia: Secondary | ICD-10-CM | POA: Diagnosis not present

## 2023-05-16 DIAGNOSIS — J690 Pneumonitis due to inhalation of food and vomit: Secondary | ICD-10-CM | POA: Diagnosis not present

## 2023-05-16 DIAGNOSIS — D631 Anemia in chronic kidney disease: Secondary | ICD-10-CM | POA: Diagnosis not present

## 2023-05-16 DIAGNOSIS — N179 Acute kidney failure, unspecified: Secondary | ICD-10-CM | POA: Diagnosis not present

## 2023-05-16 DIAGNOSIS — J449 Chronic obstructive pulmonary disease, unspecified: Secondary | ICD-10-CM | POA: Diagnosis not present

## 2023-05-16 DIAGNOSIS — J9621 Acute and chronic respiratory failure with hypoxia: Secondary | ICD-10-CM | POA: Diagnosis not present

## 2023-05-16 DIAGNOSIS — Z930 Tracheostomy status: Secondary | ICD-10-CM | POA: Diagnosis not present

## 2023-05-16 DIAGNOSIS — N2581 Secondary hyperparathyroidism of renal origin: Secondary | ICD-10-CM | POA: Diagnosis not present

## 2023-05-16 LAB — MAGNESIUM: Magnesium: 1.8 mg/dL (ref 1.7–2.4)

## 2023-05-16 LAB — PHOSPHORUS: Phosphorus: 3.6 mg/dL (ref 2.5–4.6)

## 2023-05-17 DIAGNOSIS — J9621 Acute and chronic respiratory failure with hypoxia: Secondary | ICD-10-CM | POA: Diagnosis not present

## 2023-05-17 DIAGNOSIS — Z930 Tracheostomy status: Secondary | ICD-10-CM | POA: Diagnosis not present

## 2023-05-17 DIAGNOSIS — J9620 Acute and chronic respiratory failure, unspecified whether with hypoxia or hypercapnia: Secondary | ICD-10-CM | POA: Diagnosis not present

## 2023-05-17 DIAGNOSIS — D631 Anemia in chronic kidney disease: Secondary | ICD-10-CM | POA: Diagnosis not present

## 2023-05-17 DIAGNOSIS — N2581 Secondary hyperparathyroidism of renal origin: Secondary | ICD-10-CM | POA: Diagnosis not present

## 2023-05-17 DIAGNOSIS — J449 Chronic obstructive pulmonary disease, unspecified: Secondary | ICD-10-CM | POA: Diagnosis not present

## 2023-05-17 DIAGNOSIS — N179 Acute kidney failure, unspecified: Secondary | ICD-10-CM | POA: Diagnosis not present

## 2023-05-17 DIAGNOSIS — J9600 Acute respiratory failure, unspecified whether with hypoxia or hypercapnia: Secondary | ICD-10-CM | POA: Diagnosis not present

## 2023-05-17 DIAGNOSIS — I5030 Unspecified diastolic (congestive) heart failure: Secondary | ICD-10-CM | POA: Diagnosis not present

## 2023-05-17 DIAGNOSIS — J690 Pneumonitis due to inhalation of food and vomit: Secondary | ICD-10-CM | POA: Diagnosis not present

## 2023-05-17 DIAGNOSIS — N186 End stage renal disease: Secondary | ICD-10-CM | POA: Diagnosis not present

## 2023-05-17 LAB — BASIC METABOLIC PANEL
Anion gap: 6 (ref 5–15)
BUN: 32 mg/dL — ABNORMAL HIGH (ref 8–23)
CO2: 29 mmol/L (ref 22–32)
Calcium: 8.4 mg/dL — ABNORMAL LOW (ref 8.9–10.3)
Chloride: 97 mmol/L — ABNORMAL LOW (ref 98–111)
Creatinine, Ser: 3.8 mg/dL — ABNORMAL HIGH (ref 0.44–1.00)
GFR, Estimated: 13 mL/min — ABNORMAL LOW (ref >60)
Glucose, Bld: 137 mg/dL — ABNORMAL HIGH (ref 70–99)
Potassium: 4.3 mmol/L (ref 3.5–5.1)
Sodium: 132 mmol/L — ABNORMAL LOW (ref 135–145)

## 2023-05-17 LAB — RENAL FUNCTION PANEL
Albumin: 2.1 g/dL — ABNORMAL LOW (ref 3.5–5.0)
Anion gap: 9 (ref 5–15)
BUN: 31 mg/dL — ABNORMAL HIGH (ref 8–23)
CO2: 26 mmol/L (ref 22–32)
Calcium: 8.4 mg/dL — ABNORMAL LOW (ref 8.9–10.3)
Chloride: 98 mmol/L (ref 98–111)
Creatinine, Ser: 3.82 mg/dL — ABNORMAL HIGH (ref 0.44–1.00)
GFR, Estimated: 13 mL/min — ABNORMAL LOW (ref >60)
Glucose, Bld: 136 mg/dL — ABNORMAL HIGH (ref 70–99)
Phosphorus: 5.4 mg/dL — ABNORMAL HIGH (ref 2.5–4.6)
Potassium: 4.3 mmol/L (ref 3.5–5.1)
Sodium: 133 mmol/L — ABNORMAL LOW (ref 135–145)

## 2023-05-17 LAB — CBC WITH DIFFERENTIAL/PLATELET
Abs Immature Granulocytes: 0.01 10*3/uL (ref 0.00–0.07)
Basophils Absolute: 0 10*3/uL (ref 0.0–0.1)
Basophils Relative: 0 %
Eosinophils Absolute: 0.2 10*3/uL (ref 0.0–0.5)
Eosinophils Relative: 2 %
HCT: 30.4 % — ABNORMAL LOW (ref 36.0–46.0)
Hemoglobin: 8.8 g/dL — ABNORMAL LOW (ref 12.0–15.0)
Immature Granulocytes: 0 %
Lymphocytes Relative: 28 %
Lymphs Abs: 1.8 10*3/uL (ref 0.7–4.0)
MCH: 29.4 pg (ref 26.0–34.0)
MCHC: 28.9 g/dL — ABNORMAL LOW (ref 30.0–36.0)
MCV: 101.7 fL — ABNORMAL HIGH (ref 80.0–100.0)
Monocytes Absolute: 0.8 10*3/uL (ref 0.1–1.0)
Monocytes Relative: 12 %
Neutro Abs: 3.6 10*3/uL (ref 1.7–7.7)
Neutrophils Relative %: 58 %
Platelets: 190 10*3/uL (ref 150–400)
RBC: 2.99 MIL/uL — ABNORMAL LOW (ref 3.87–5.11)
RDW: 16.4 % — ABNORMAL HIGH (ref 11.5–15.5)
WBC: 6.4 10*3/uL (ref 4.0–10.5)
nRBC: 0 % (ref 0.0–0.2)

## 2023-05-18 DIAGNOSIS — J690 Pneumonitis due to inhalation of food and vomit: Secondary | ICD-10-CM | POA: Diagnosis not present

## 2023-05-18 DIAGNOSIS — N186 End stage renal disease: Secondary | ICD-10-CM | POA: Diagnosis not present

## 2023-05-18 DIAGNOSIS — Z930 Tracheostomy status: Secondary | ICD-10-CM | POA: Diagnosis not present

## 2023-05-18 DIAGNOSIS — J9621 Acute and chronic respiratory failure with hypoxia: Secondary | ICD-10-CM | POA: Diagnosis not present

## 2023-05-18 DIAGNOSIS — I5030 Unspecified diastolic (congestive) heart failure: Secondary | ICD-10-CM | POA: Diagnosis not present

## 2023-05-18 DIAGNOSIS — J449 Chronic obstructive pulmonary disease, unspecified: Secondary | ICD-10-CM | POA: Diagnosis not present

## 2023-05-18 DIAGNOSIS — J9620 Acute and chronic respiratory failure, unspecified whether with hypoxia or hypercapnia: Secondary | ICD-10-CM | POA: Diagnosis not present

## 2023-05-18 DEATH — deceased

## 2023-05-19 ENCOUNTER — Other Ambulatory Visit (HOSPITAL_COMMUNITY): Payer: 59

## 2023-05-19 DIAGNOSIS — J449 Chronic obstructive pulmonary disease, unspecified: Secondary | ICD-10-CM | POA: Diagnosis not present

## 2023-05-19 DIAGNOSIS — D631 Anemia in chronic kidney disease: Secondary | ICD-10-CM | POA: Diagnosis not present

## 2023-05-19 DIAGNOSIS — J9621 Acute and chronic respiratory failure with hypoxia: Secondary | ICD-10-CM | POA: Diagnosis not present

## 2023-05-19 DIAGNOSIS — Z930 Tracheostomy status: Secondary | ICD-10-CM | POA: Diagnosis not present

## 2023-05-19 DIAGNOSIS — J9600 Acute respiratory failure, unspecified whether with hypoxia or hypercapnia: Secondary | ICD-10-CM | POA: Diagnosis not present

## 2023-05-19 DIAGNOSIS — N2581 Secondary hyperparathyroidism of renal origin: Secondary | ICD-10-CM | POA: Diagnosis not present

## 2023-05-19 DIAGNOSIS — J690 Pneumonitis due to inhalation of food and vomit: Secondary | ICD-10-CM | POA: Diagnosis not present

## 2023-05-19 DIAGNOSIS — N179 Acute kidney failure, unspecified: Secondary | ICD-10-CM | POA: Diagnosis not present

## 2023-05-19 DIAGNOSIS — N186 End stage renal disease: Secondary | ICD-10-CM | POA: Diagnosis not present

## 2023-05-19 DIAGNOSIS — I5030 Unspecified diastolic (congestive) heart failure: Secondary | ICD-10-CM | POA: Diagnosis not present

## 2023-05-19 LAB — MAGNESIUM: Magnesium: 2 mg/dL (ref 1.7–2.4)

## 2023-05-19 LAB — PHOSPHORUS: Phosphorus: 4.4 mg/dL (ref 2.5–4.6)

## 2023-05-20 DIAGNOSIS — N186 End stage renal disease: Secondary | ICD-10-CM | POA: Diagnosis not present

## 2023-05-20 DIAGNOSIS — J449 Chronic obstructive pulmonary disease, unspecified: Secondary | ICD-10-CM | POA: Diagnosis not present

## 2023-05-20 DIAGNOSIS — Z930 Tracheostomy status: Secondary | ICD-10-CM | POA: Diagnosis not present

## 2023-05-20 DIAGNOSIS — I5030 Unspecified diastolic (congestive) heart failure: Secondary | ICD-10-CM | POA: Diagnosis not present

## 2023-05-20 DIAGNOSIS — J690 Pneumonitis due to inhalation of food and vomit: Secondary | ICD-10-CM | POA: Diagnosis not present

## 2023-05-20 DIAGNOSIS — J9621 Acute and chronic respiratory failure with hypoxia: Secondary | ICD-10-CM | POA: Diagnosis not present

## 2023-05-20 LAB — CBC WITH DIFFERENTIAL/PLATELET
Abs Immature Granulocytes: 0.03 10*3/uL (ref 0.00–0.07)
Basophils Absolute: 0 10*3/uL (ref 0.0–0.1)
Basophils Relative: 1 %
Eosinophils Absolute: 0.3 10*3/uL (ref 0.0–0.5)
Eosinophils Relative: 5 %
HCT: 32.1 % — ABNORMAL LOW (ref 36.0–46.0)
Hemoglobin: 9.4 g/dL — ABNORMAL LOW (ref 12.0–15.0)
Immature Granulocytes: 0 %
Lymphocytes Relative: 29 %
Lymphs Abs: 2 10*3/uL (ref 0.7–4.0)
MCH: 29.6 pg (ref 26.0–34.0)
MCHC: 29.3 g/dL — ABNORMAL LOW (ref 30.0–36.0)
MCV: 100.9 fL — ABNORMAL HIGH (ref 80.0–100.0)
Monocytes Absolute: 1 10*3/uL (ref 0.1–1.0)
Monocytes Relative: 14 %
Neutro Abs: 3.5 10*3/uL (ref 1.7–7.7)
Neutrophils Relative %: 51 %
Platelets: 183 10*3/uL (ref 150–400)
RBC: 3.18 MIL/uL — ABNORMAL LOW (ref 3.87–5.11)
RDW: 15.9 % — ABNORMAL HIGH (ref 11.5–15.5)
WBC: 6.9 10*3/uL (ref 4.0–10.5)
nRBC: 0 % (ref 0.0–0.2)

## 2023-05-20 LAB — BASIC METABOLIC PANEL
Anion gap: 9 (ref 5–15)
BUN: 56 mg/dL — ABNORMAL HIGH (ref 8–23)
CO2: 25 mmol/L (ref 22–32)
Calcium: 8.7 mg/dL — ABNORMAL LOW (ref 8.9–10.3)
Chloride: 93 mmol/L — ABNORMAL LOW (ref 98–111)
Creatinine, Ser: 3.56 mg/dL — ABNORMAL HIGH (ref 0.44–1.00)
GFR, Estimated: 14 mL/min — ABNORMAL LOW (ref >60)
Glucose, Bld: 132 mg/dL — ABNORMAL HIGH (ref 70–99)
Potassium: 4.7 mmol/L (ref 3.5–5.1)
Sodium: 127 mmol/L — ABNORMAL LOW (ref 135–145)

## 2023-05-20 LAB — ALBUMIN: Albumin: 2.2 g/dL — ABNORMAL LOW (ref 3.5–5.0)

## 2023-05-20 LAB — PHOSPHORUS: Phosphorus: 6.1 mg/dL — ABNORMAL HIGH (ref 2.5–4.6)

## 2023-05-21 DIAGNOSIS — I5030 Unspecified diastolic (congestive) heart failure: Secondary | ICD-10-CM | POA: Diagnosis not present

## 2023-05-21 DIAGNOSIS — Z930 Tracheostomy status: Secondary | ICD-10-CM | POA: Diagnosis not present

## 2023-05-21 DIAGNOSIS — N186 End stage renal disease: Secondary | ICD-10-CM | POA: Diagnosis not present

## 2023-05-21 DIAGNOSIS — J9621 Acute and chronic respiratory failure with hypoxia: Secondary | ICD-10-CM | POA: Diagnosis not present

## 2023-05-21 DIAGNOSIS — J690 Pneumonitis due to inhalation of food and vomit: Secondary | ICD-10-CM | POA: Diagnosis not present

## 2023-05-21 DIAGNOSIS — J449 Chronic obstructive pulmonary disease, unspecified: Secondary | ICD-10-CM | POA: Diagnosis not present

## 2023-05-21 DIAGNOSIS — N2581 Secondary hyperparathyroidism of renal origin: Secondary | ICD-10-CM | POA: Diagnosis not present

## 2023-05-21 DIAGNOSIS — N179 Acute kidney failure, unspecified: Secondary | ICD-10-CM | POA: Diagnosis not present

## 2023-05-21 DIAGNOSIS — D631 Anemia in chronic kidney disease: Secondary | ICD-10-CM | POA: Diagnosis not present

## 2023-05-21 DIAGNOSIS — J9600 Acute respiratory failure, unspecified whether with hypoxia or hypercapnia: Secondary | ICD-10-CM | POA: Diagnosis not present

## 2023-05-21 LAB — BLOOD GAS, ARTERIAL
Acid-Base Excess: 3.2 mmol/L — ABNORMAL HIGH (ref 0.0–2.0)
Bicarbonate: 31.7 mmol/L — ABNORMAL HIGH (ref 20.0–28.0)
O2 Saturation: 99.3 %
Patient temperature: 37
pCO2 arterial: 66 mmHg (ref 32–48)
pH, Arterial: 7.29 — ABNORMAL LOW (ref 7.35–7.45)
pO2, Arterial: 105 mmHg (ref 83–108)

## 2023-05-21 LAB — PHOSPHORUS: Phosphorus: 4.6 mg/dL (ref 2.5–4.6)

## 2023-05-21 LAB — MAGNESIUM: Magnesium: 2.1 mg/dL (ref 1.7–2.4)

## 2023-05-21 LAB — TRIGLYCERIDES: Triglycerides: 68 mg/dL (ref <150)

## 2023-05-22 DIAGNOSIS — J449 Chronic obstructive pulmonary disease, unspecified: Secondary | ICD-10-CM | POA: Diagnosis not present

## 2023-05-22 DIAGNOSIS — J9620 Acute and chronic respiratory failure, unspecified whether with hypoxia or hypercapnia: Secondary | ICD-10-CM | POA: Diagnosis not present

## 2023-05-22 DIAGNOSIS — N186 End stage renal disease: Secondary | ICD-10-CM | POA: Diagnosis not present

## 2023-05-22 DIAGNOSIS — J9621 Acute and chronic respiratory failure with hypoxia: Secondary | ICD-10-CM | POA: Diagnosis not present

## 2023-05-22 DIAGNOSIS — I5030 Unspecified diastolic (congestive) heart failure: Secondary | ICD-10-CM | POA: Diagnosis not present

## 2023-05-22 DIAGNOSIS — Z930 Tracheostomy status: Secondary | ICD-10-CM | POA: Diagnosis not present

## 2023-05-22 DIAGNOSIS — J690 Pneumonitis due to inhalation of food and vomit: Secondary | ICD-10-CM | POA: Diagnosis not present

## 2023-05-22 LAB — BASIC METABOLIC PANEL
Anion gap: 7 (ref 5–15)
BUN: 19 mg/dL (ref 8–23)
CO2: 29 mmol/L (ref 22–32)
Calcium: 8.3 mg/dL — ABNORMAL LOW (ref 8.9–10.3)
Chloride: 98 mmol/L (ref 98–111)
Creatinine, Ser: 1.5 mg/dL — ABNORMAL HIGH (ref 0.44–1.00)
GFR, Estimated: 39 mL/min — ABNORMAL LOW (ref >60)
Glucose, Bld: 134 mg/dL — ABNORMAL HIGH (ref 70–99)
Potassium: 4.4 mmol/L (ref 3.5–5.1)
Sodium: 134 mmol/L — ABNORMAL LOW (ref 135–145)

## 2023-05-22 LAB — CBC
HCT: 32.3 % — ABNORMAL LOW (ref 36.0–46.0)
Hemoglobin: 9.3 g/dL — ABNORMAL LOW (ref 12.0–15.0)
MCH: 29.3 pg (ref 26.0–34.0)
MCHC: 28.8 g/dL — ABNORMAL LOW (ref 30.0–36.0)
MCV: 101.9 fL — ABNORMAL HIGH (ref 80.0–100.0)
Platelets: 129 10*3/uL — ABNORMAL LOW (ref 150–400)
RBC: 3.17 MIL/uL — ABNORMAL LOW (ref 3.87–5.11)
RDW: 16.1 % — ABNORMAL HIGH (ref 11.5–15.5)
WBC: 8.2 10*3/uL (ref 4.0–10.5)
nRBC: 0 % (ref 0.0–0.2)

## 2023-05-22 LAB — MAGNESIUM: Magnesium: 2 mg/dL (ref 1.7–2.4)

## 2023-05-23 DIAGNOSIS — N179 Acute kidney failure, unspecified: Secondary | ICD-10-CM | POA: Diagnosis not present

## 2023-05-23 DIAGNOSIS — J9621 Acute and chronic respiratory failure with hypoxia: Secondary | ICD-10-CM | POA: Diagnosis not present

## 2023-05-23 DIAGNOSIS — D631 Anemia in chronic kidney disease: Secondary | ICD-10-CM | POA: Diagnosis not present

## 2023-05-23 DIAGNOSIS — J9600 Acute respiratory failure, unspecified whether with hypoxia or hypercapnia: Secondary | ICD-10-CM | POA: Diagnosis not present

## 2023-05-23 DIAGNOSIS — N2581 Secondary hyperparathyroidism of renal origin: Secondary | ICD-10-CM | POA: Diagnosis not present

## 2023-05-23 DIAGNOSIS — N186 End stage renal disease: Secondary | ICD-10-CM | POA: Diagnosis not present

## 2023-05-23 DIAGNOSIS — J449 Chronic obstructive pulmonary disease, unspecified: Secondary | ICD-10-CM | POA: Diagnosis not present

## 2023-05-23 DIAGNOSIS — J690 Pneumonitis due to inhalation of food and vomit: Secondary | ICD-10-CM | POA: Diagnosis not present

## 2023-05-23 DIAGNOSIS — I5030 Unspecified diastolic (congestive) heart failure: Secondary | ICD-10-CM | POA: Diagnosis not present

## 2023-05-23 DIAGNOSIS — Z930 Tracheostomy status: Secondary | ICD-10-CM | POA: Diagnosis not present

## 2023-05-23 DIAGNOSIS — J9620 Acute and chronic respiratory failure, unspecified whether with hypoxia or hypercapnia: Secondary | ICD-10-CM | POA: Diagnosis not present

## 2023-05-23 LAB — CBC WITH DIFFERENTIAL/PLATELET
Abs Immature Granulocytes: 0.04 10*3/uL (ref 0.00–0.07)
Basophils Absolute: 0 10*3/uL (ref 0.0–0.1)
Basophils Relative: 1 %
Eosinophils Absolute: 0 10*3/uL (ref 0.0–0.5)
Eosinophils Relative: 0 %
HCT: 32.7 % — ABNORMAL LOW (ref 36.0–46.0)
Hemoglobin: 9.2 g/dL — ABNORMAL LOW (ref 12.0–15.0)
Immature Granulocytes: 1 %
Lymphocytes Relative: 14 %
Lymphs Abs: 1.2 10*3/uL (ref 0.7–4.0)
MCH: 28.9 pg (ref 26.0–34.0)
MCHC: 28.1 g/dL — ABNORMAL LOW (ref 30.0–36.0)
MCV: 102.8 fL — ABNORMAL HIGH (ref 80.0–100.0)
Monocytes Absolute: 1 10*3/uL (ref 0.1–1.0)
Monocytes Relative: 12 %
Neutro Abs: 6 10*3/uL (ref 1.7–7.7)
Neutrophils Relative %: 72 %
Platelets: 133 10*3/uL — ABNORMAL LOW (ref 150–400)
RBC: 3.18 MIL/uL — ABNORMAL LOW (ref 3.87–5.11)
RDW: 15.9 % — ABNORMAL HIGH (ref 11.5–15.5)
WBC: 8.3 10*3/uL (ref 4.0–10.5)
nRBC: 0 % (ref 0.0–0.2)

## 2023-05-23 LAB — PHOSPHORUS
Phosphorus: 3.9 mg/dL (ref 2.5–4.6)
Phosphorus: 5.6 mg/dL — ABNORMAL HIGH (ref 2.5–4.6)

## 2023-05-23 LAB — MAGNESIUM: Magnesium: 2.2 mg/dL (ref 1.7–2.4)

## 2023-05-23 LAB — ALBUMIN: Albumin: 2.3 g/dL — ABNORMAL LOW (ref 3.5–5.0)

## 2023-05-24 ENCOUNTER — Other Ambulatory Visit (HOSPITAL_COMMUNITY): Payer: 59

## 2023-05-24 DIAGNOSIS — J690 Pneumonitis due to inhalation of food and vomit: Secondary | ICD-10-CM | POA: Diagnosis not present

## 2023-05-24 DIAGNOSIS — Z930 Tracheostomy status: Secondary | ICD-10-CM | POA: Diagnosis not present

## 2023-05-24 DIAGNOSIS — N186 End stage renal disease: Secondary | ICD-10-CM | POA: Diagnosis not present

## 2023-05-24 DIAGNOSIS — J449 Chronic obstructive pulmonary disease, unspecified: Secondary | ICD-10-CM | POA: Diagnosis not present

## 2023-05-24 DIAGNOSIS — J9621 Acute and chronic respiratory failure with hypoxia: Secondary | ICD-10-CM | POA: Diagnosis not present

## 2023-05-24 DIAGNOSIS — I5030 Unspecified diastolic (congestive) heart failure: Secondary | ICD-10-CM | POA: Diagnosis not present

## 2023-05-24 DIAGNOSIS — J9620 Acute and chronic respiratory failure, unspecified whether with hypoxia or hypercapnia: Secondary | ICD-10-CM | POA: Diagnosis not present

## 2023-05-24 LAB — CBC WITH DIFFERENTIAL/PLATELET
Abs Immature Granulocytes: 0.07 10*3/uL (ref 0.00–0.07)
Basophils Absolute: 0.1 10*3/uL (ref 0.0–0.1)
Basophils Relative: 1 %
Eosinophils Absolute: 0 10*3/uL (ref 0.0–0.5)
Eosinophils Relative: 0 %
HCT: 30.3 % — ABNORMAL LOW (ref 36.0–46.0)
Hemoglobin: 8.5 g/dL — ABNORMAL LOW (ref 12.0–15.0)
Immature Granulocytes: 1 %
Lymphocytes Relative: 18 %
Lymphs Abs: 1.8 10*3/uL (ref 0.7–4.0)
MCH: 29.3 pg (ref 26.0–34.0)
MCHC: 28.1 g/dL — ABNORMAL LOW (ref 30.0–36.0)
MCV: 104.5 fL — ABNORMAL HIGH (ref 80.0–100.0)
Monocytes Absolute: 1.1 10*3/uL — ABNORMAL HIGH (ref 0.1–1.0)
Monocytes Relative: 12 %
Neutro Abs: 6.5 10*3/uL (ref 1.7–7.7)
Neutrophils Relative %: 68 %
Platelets: 175 10*3/uL (ref 150–400)
RBC: 2.9 MIL/uL — ABNORMAL LOW (ref 3.87–5.11)
RDW: 15.9 % — ABNORMAL HIGH (ref 11.5–15.5)
WBC: 9.5 10*3/uL (ref 4.0–10.5)
nRBC: 0 % (ref 0.0–0.2)

## 2023-05-24 LAB — RENAL FUNCTION PANEL
Albumin: 2.6 g/dL — ABNORMAL LOW (ref 3.5–5.0)
Anion gap: 8 (ref 5–15)
BUN: 46 mg/dL — ABNORMAL HIGH (ref 8–23)
CO2: 29 mmol/L (ref 22–32)
Calcium: 8.9 mg/dL (ref 8.9–10.3)
Chloride: 92 mmol/L — ABNORMAL LOW (ref 98–111)
Creatinine, Ser: 2.49 mg/dL — ABNORMAL HIGH (ref 0.44–1.00)
GFR, Estimated: 21 mL/min — ABNORMAL LOW (ref >60)
Glucose, Bld: 108 mg/dL — ABNORMAL HIGH (ref 70–99)
Phosphorus: 6.5 mg/dL — ABNORMAL HIGH (ref 2.5–4.6)
Potassium: 4.6 mmol/L (ref 3.5–5.1)
Sodium: 129 mmol/L — ABNORMAL LOW (ref 135–145)

## 2023-05-24 LAB — MAGNESIUM: Magnesium: 2.2 mg/dL (ref 1.7–2.4)

## 2023-05-25 ENCOUNTER — Other Ambulatory Visit (HOSPITAL_COMMUNITY): Payer: 59

## 2023-05-25 DIAGNOSIS — J9620 Acute and chronic respiratory failure, unspecified whether with hypoxia or hypercapnia: Secondary | ICD-10-CM | POA: Diagnosis not present

## 2023-05-25 DIAGNOSIS — N186 End stage renal disease: Secondary | ICD-10-CM | POA: Diagnosis not present

## 2023-05-25 DIAGNOSIS — J449 Chronic obstructive pulmonary disease, unspecified: Secondary | ICD-10-CM | POA: Diagnosis not present

## 2023-05-25 DIAGNOSIS — I5030 Unspecified diastolic (congestive) heart failure: Secondary | ICD-10-CM | POA: Diagnosis not present

## 2023-05-25 DIAGNOSIS — J690 Pneumonitis due to inhalation of food and vomit: Secondary | ICD-10-CM | POA: Diagnosis not present

## 2023-05-25 DIAGNOSIS — Z930 Tracheostomy status: Secondary | ICD-10-CM | POA: Diagnosis not present

## 2023-05-25 DIAGNOSIS — J9621 Acute and chronic respiratory failure with hypoxia: Secondary | ICD-10-CM | POA: Diagnosis not present

## 2023-05-25 LAB — BLOOD GAS, ARTERIAL
Acid-Base Excess: 2.3 mmol/L — ABNORMAL HIGH (ref 0.0–2.0)
Acid-Base Excess: 2.6 mmol/L — ABNORMAL HIGH (ref 0.0–2.0)
Acid-Base Excess: 4.9 mmol/L — ABNORMAL HIGH (ref 0.0–2.0)
Bicarbonate: 33 mmol/L — ABNORMAL HIGH (ref 20.0–28.0)
Bicarbonate: 33.2 mmol/L — ABNORMAL HIGH (ref 20.0–28.0)
Bicarbonate: 32.7 mmol/L — ABNORMAL HIGH (ref 20.0–28.0)
O2 Saturation: 89 %
O2 Saturation: 99.6 %
O2 Saturation: 99.6 %
Patient temperature: 36.3
Patient temperature: 36.6
Patient temperature: 37
pCO2 arterial: 94 mmHg (ref 32–48)
pCO2 arterial: 81 mmHg (ref 32–48)
pCO2 arterial: 62 mmHg — ABNORMAL HIGH (ref 32–48)
pH, Arterial: 7.15 — CL (ref 7.35–7.45)
pH, Arterial: 7.22 — ABNORMAL LOW (ref 7.35–7.45)
pH, Arterial: 7.33 — ABNORMAL LOW (ref 7.35–7.45)
pO2, Arterial: 64 mmHg — ABNORMAL LOW (ref 83–108)
pO2, Arterial: 134 mmHg — ABNORMAL HIGH (ref 83–108)
pO2, Arterial: 104 mmHg (ref 83–108)

## 2023-05-26 DIAGNOSIS — J690 Pneumonitis due to inhalation of food and vomit: Secondary | ICD-10-CM | POA: Diagnosis not present

## 2023-05-26 DIAGNOSIS — N2581 Secondary hyperparathyroidism of renal origin: Secondary | ICD-10-CM | POA: Diagnosis not present

## 2023-05-26 DIAGNOSIS — J449 Chronic obstructive pulmonary disease, unspecified: Secondary | ICD-10-CM | POA: Diagnosis not present

## 2023-05-26 DIAGNOSIS — I5030 Unspecified diastolic (congestive) heart failure: Secondary | ICD-10-CM | POA: Diagnosis not present

## 2023-05-26 DIAGNOSIS — J9600 Acute respiratory failure, unspecified whether with hypoxia or hypercapnia: Secondary | ICD-10-CM | POA: Diagnosis not present

## 2023-05-26 DIAGNOSIS — N186 End stage renal disease: Secondary | ICD-10-CM | POA: Diagnosis not present

## 2023-05-26 DIAGNOSIS — J9621 Acute and chronic respiratory failure with hypoxia: Secondary | ICD-10-CM | POA: Diagnosis not present

## 2023-05-26 DIAGNOSIS — Z930 Tracheostomy status: Secondary | ICD-10-CM | POA: Diagnosis not present

## 2023-05-26 DIAGNOSIS — D631 Anemia in chronic kidney disease: Secondary | ICD-10-CM | POA: Diagnosis not present

## 2023-05-26 DIAGNOSIS — N179 Acute kidney failure, unspecified: Secondary | ICD-10-CM | POA: Diagnosis not present

## 2023-05-26 LAB — PHOSPHORUS: Phosphorus: 4.6 mg/dL (ref 2.5–4.6)

## 2023-05-26 LAB — MAGNESIUM: Magnesium: 2.1 mg/dL (ref 1.7–2.4)

## 2023-05-27 ENCOUNTER — Other Ambulatory Visit (HOSPITAL_COMMUNITY): Payer: 59

## 2023-05-27 DIAGNOSIS — I5030 Unspecified diastolic (congestive) heart failure: Secondary | ICD-10-CM | POA: Diagnosis not present

## 2023-05-27 DIAGNOSIS — N186 End stage renal disease: Secondary | ICD-10-CM | POA: Diagnosis not present

## 2023-05-27 DIAGNOSIS — J690 Pneumonitis due to inhalation of food and vomit: Secondary | ICD-10-CM | POA: Diagnosis not present

## 2023-05-27 DIAGNOSIS — J9621 Acute and chronic respiratory failure with hypoxia: Secondary | ICD-10-CM | POA: Diagnosis not present

## 2023-05-27 DIAGNOSIS — Z930 Tracheostomy status: Secondary | ICD-10-CM | POA: Diagnosis not present

## 2023-05-27 DIAGNOSIS — J449 Chronic obstructive pulmonary disease, unspecified: Secondary | ICD-10-CM | POA: Diagnosis not present

## 2023-05-27 LAB — RENAL FUNCTION PANEL
Albumin: 2.6 g/dL — ABNORMAL LOW (ref 3.5–5.0)
Anion gap: 8 (ref 5–15)
BUN: 63 mg/dL — ABNORMAL HIGH (ref 8–23)
CO2: 28 mmol/L (ref 22–32)
Calcium: 9.1 mg/dL (ref 8.9–10.3)
Chloride: 97 mmol/L — ABNORMAL LOW (ref 98–111)
Creatinine, Ser: 2.44 mg/dL — ABNORMAL HIGH (ref 0.44–1.00)
GFR, Estimated: 22 mL/min — ABNORMAL LOW (ref >60)
Glucose, Bld: 104 mg/dL — ABNORMAL HIGH (ref 70–99)
Phosphorus: 5.4 mg/dL — ABNORMAL HIGH (ref 2.5–4.6)
Potassium: 5.2 mmol/L — ABNORMAL HIGH (ref 3.5–5.1)
Sodium: 133 mmol/L — ABNORMAL LOW (ref 135–145)

## 2023-05-27 LAB — CBC WITH DIFFERENTIAL/PLATELET
Abs Immature Granulocytes: 0.03 10*3/uL (ref 0.00–0.07)
Basophils Absolute: 0.1 10*3/uL (ref 0.0–0.1)
Basophils Relative: 1 %
Eosinophils Absolute: 0.1 10*3/uL (ref 0.0–0.5)
Eosinophils Relative: 1 %
HCT: 28.5 % — ABNORMAL LOW (ref 36.0–46.0)
Hemoglobin: 8.5 g/dL — ABNORMAL LOW (ref 12.0–15.0)
Immature Granulocytes: 0 %
Lymphocytes Relative: 29 %
Lymphs Abs: 2 10*3/uL (ref 0.7–4.0)
MCH: 30.5 pg (ref 26.0–34.0)
MCHC: 29.8 g/dL — ABNORMAL LOW (ref 30.0–36.0)
MCV: 102.2 fL — ABNORMAL HIGH (ref 80.0–100.0)
Monocytes Absolute: 1 10*3/uL (ref 0.1–1.0)
Monocytes Relative: 15 %
Neutro Abs: 3.7 10*3/uL (ref 1.7–7.7)
Neutrophils Relative %: 54 %
Platelets: 208 10*3/uL (ref 150–400)
RBC: 2.79 MIL/uL — ABNORMAL LOW (ref 3.87–5.11)
RDW: 16.2 % — ABNORMAL HIGH (ref 11.5–15.5)
WBC: 6.8 10*3/uL (ref 4.0–10.5)
nRBC: 0.3 % — ABNORMAL HIGH (ref 0.0–0.2)

## 2023-05-27 LAB — FOLATE: Folate: 7.7 ng/mL (ref >5.9)

## 2023-05-27 LAB — TRIGLYCERIDES: Triglycerides: 124 mg/dL (ref <150)

## 2023-05-27 LAB — MAGNESIUM: Magnesium: 2.2 mg/dL (ref 1.7–2.4)

## 2023-05-27 LAB — VITAMIN B12: Vitamin B-12: 359 pg/mL (ref 180–914)

## 2023-05-27 LAB — PHOSPHORUS: Phosphorus: 5.4 mg/dL — ABNORMAL HIGH (ref 2.5–4.6)

## 2023-05-27 LAB — VITAMIN D 25 HYDROXY (VIT D DEFICIENCY, FRACTURES): Vit D, 25-Hydroxy: 28.43 ng/mL — ABNORMAL LOW (ref 30–100)

## 2023-05-28 ENCOUNTER — Other Ambulatory Visit (HOSPITAL_COMMUNITY): Payer: 59

## 2023-05-28 DIAGNOSIS — J690 Pneumonitis due to inhalation of food and vomit: Secondary | ICD-10-CM | POA: Diagnosis not present

## 2023-05-28 DIAGNOSIS — J9621 Acute and chronic respiratory failure with hypoxia: Secondary | ICD-10-CM | POA: Diagnosis not present

## 2023-05-28 DIAGNOSIS — Z930 Tracheostomy status: Secondary | ICD-10-CM | POA: Diagnosis not present

## 2023-05-28 DIAGNOSIS — J449 Chronic obstructive pulmonary disease, unspecified: Secondary | ICD-10-CM | POA: Diagnosis not present

## 2023-05-28 DIAGNOSIS — J9600 Acute respiratory failure, unspecified whether with hypoxia or hypercapnia: Secondary | ICD-10-CM | POA: Diagnosis not present

## 2023-05-28 DIAGNOSIS — I5030 Unspecified diastolic (congestive) heart failure: Secondary | ICD-10-CM | POA: Diagnosis not present

## 2023-05-28 DIAGNOSIS — N179 Acute kidney failure, unspecified: Secondary | ICD-10-CM | POA: Diagnosis not present

## 2023-05-28 DIAGNOSIS — N2581 Secondary hyperparathyroidism of renal origin: Secondary | ICD-10-CM | POA: Diagnosis not present

## 2023-05-28 DIAGNOSIS — D631 Anemia in chronic kidney disease: Secondary | ICD-10-CM | POA: Diagnosis not present

## 2023-05-28 DIAGNOSIS — N186 End stage renal disease: Secondary | ICD-10-CM | POA: Diagnosis not present

## 2023-05-28 LAB — PHOSPHORUS: Phosphorus: 4.8 mg/dL — ABNORMAL HIGH (ref 2.5–4.6)

## 2023-05-28 LAB — HEPATITIS B SURFACE ANTIGEN: Hepatitis B Surface Ag: NONREACTIVE

## 2023-05-28 LAB — TRIGLYCERIDES: Triglycerides: 58 mg/dL (ref <150)

## 2023-05-28 LAB — MAGNESIUM: Magnesium: 2 mg/dL (ref 1.7–2.4)

## 2023-05-28 MED ORDER — IOHEXOL 9 MG/ML PO SOLN: 500.0000 mL | ORAL | Status: AC

## 2023-05-29 DIAGNOSIS — J9621 Acute and chronic respiratory failure with hypoxia: Secondary | ICD-10-CM

## 2023-05-29 DIAGNOSIS — N186 End stage renal disease: Secondary | ICD-10-CM

## 2023-05-29 DIAGNOSIS — Z930 Tracheostomy status: Secondary | ICD-10-CM

## 2023-05-29 DIAGNOSIS — I5030 Unspecified diastolic (congestive) heart failure: Secondary | ICD-10-CM

## 2023-05-29 DIAGNOSIS — J690 Pneumonitis due to inhalation of food and vomit: Secondary | ICD-10-CM

## 2023-05-29 DIAGNOSIS — J449 Chronic obstructive pulmonary disease, unspecified: Secondary | ICD-10-CM

## 2023-05-29 LAB — RENAL FUNCTION PANEL
Albumin: 2.6 g/dL — ABNORMAL LOW (ref 3.5–5.0)
Anion gap: 8 (ref 5–15)
BUN: 49 mg/dL — ABNORMAL HIGH (ref 8–23)
CO2: 29 mmol/L (ref 22–32)
Calcium: 9 mg/dL (ref 8.9–10.3)
Chloride: 96 mmol/L — ABNORMAL LOW (ref 98–111)
Creatinine, Ser: 1.87 mg/dL — ABNORMAL HIGH (ref 0.44–1.00)
GFR, Estimated: 30 mL/min — ABNORMAL LOW (ref >60)
Glucose, Bld: 136 mg/dL — ABNORMAL HIGH (ref 70–99)
Phosphorus: 5.7 mg/dL — ABNORMAL HIGH (ref 2.5–4.6)
Potassium: 4.4 mmol/L (ref 3.5–5.1)
Sodium: 133 mmol/L — ABNORMAL LOW (ref 135–145)

## 2023-05-29 LAB — CBC WITH DIFFERENTIAL/PLATELET
Abs Immature Granulocytes: 0.05 10*3/uL (ref 0.00–0.07)
Basophils Absolute: 0 10*3/uL (ref 0.0–0.1)
Basophils Relative: 1 %
Eosinophils Absolute: 0.1 10*3/uL (ref 0.0–0.5)
Eosinophils Relative: 1 %
HCT: 30.9 % — ABNORMAL LOW (ref 36.0–46.0)
Hemoglobin: 9.4 g/dL — ABNORMAL LOW (ref 12.0–15.0)
Immature Granulocytes: 1 %
Lymphocytes Relative: 25 %
Lymphs Abs: 1.8 10*3/uL (ref 0.7–4.0)
MCH: 30.7 pg (ref 26.0–34.0)
MCHC: 30.4 g/dL (ref 30.0–36.0)
MCV: 101 fL — ABNORMAL HIGH (ref 80.0–100.0)
Monocytes Absolute: 1 10*3/uL (ref 0.1–1.0)
Monocytes Relative: 14 %
Neutro Abs: 4.2 10*3/uL (ref 1.7–7.7)
Neutrophils Relative %: 58 %
Platelets: 191 10*3/uL (ref 150–400)
RBC: 3.06 MIL/uL — ABNORMAL LOW (ref 3.87–5.11)
RDW: 16.2 % — ABNORMAL HIGH (ref 11.5–15.5)
WBC: 7.2 10*3/uL (ref 4.0–10.5)
nRBC: 0.3 % — ABNORMAL HIGH (ref 0.0–0.2)

## 2023-05-29 LAB — MAGNESIUM: Magnesium: 2 mg/dL (ref 1.7–2.4)

## 2023-05-30 DIAGNOSIS — N186 End stage renal disease: Secondary | ICD-10-CM

## 2023-05-30 DIAGNOSIS — J9600 Acute respiratory failure, unspecified whether with hypoxia or hypercapnia: Secondary | ICD-10-CM | POA: Diagnosis not present

## 2023-05-30 DIAGNOSIS — J690 Pneumonitis due to inhalation of food and vomit: Secondary | ICD-10-CM

## 2023-05-30 DIAGNOSIS — D631 Anemia in chronic kidney disease: Secondary | ICD-10-CM | POA: Diagnosis not present

## 2023-05-30 DIAGNOSIS — I5030 Unspecified diastolic (congestive) heart failure: Secondary | ICD-10-CM

## 2023-05-30 DIAGNOSIS — J449 Chronic obstructive pulmonary disease, unspecified: Secondary | ICD-10-CM

## 2023-05-30 DIAGNOSIS — J9621 Acute and chronic respiratory failure with hypoxia: Secondary | ICD-10-CM

## 2023-05-30 DIAGNOSIS — Z930 Tracheostomy status: Secondary | ICD-10-CM

## 2023-05-30 DIAGNOSIS — N2581 Secondary hyperparathyroidism of renal origin: Secondary | ICD-10-CM | POA: Diagnosis not present

## 2023-05-30 DIAGNOSIS — N179 Acute kidney failure, unspecified: Secondary | ICD-10-CM | POA: Diagnosis not present

## 2023-05-30 LAB — CULTURE, RESPIRATORY W GRAM STAIN: Culture: NORMAL

## 2023-05-30 LAB — BLOOD GAS, ARTERIAL
Acid-Base Excess: 7 mmol/L — ABNORMAL HIGH (ref 0.0–2.0)
Bicarbonate: 34.5 mmol/L — ABNORMAL HIGH (ref 20.0–28.0)
O2 Saturation: 99.2 %
Patient temperature: 37
pCO2 arterial: 61 mmHg — ABNORMAL HIGH (ref 32–48)
pH, Arterial: 7.36 (ref 7.35–7.45)
pO2, Arterial: 93 mmHg (ref 83–108)

## 2023-05-30 LAB — PHOSPHORUS: Phosphorus: 4.4 mg/dL (ref 2.5–4.6)

## 2023-05-30 LAB — MAGNESIUM: Magnesium: 1.9 mg/dL (ref 1.7–2.4)

## 2023-05-30 IMAGING — CT CT CHEST W/ CM
2 of 3 series · 15 of 36 positions shown, 18 images · IV contrast (Omnipaque or Isovue)
Comparison: 05/10/2021 PET-CT.  03/26/2021 chest CT.

CLINICAL DATA: Follow-up pulmonary nodules. Worsening chronic
dyspnea. Current smoker. COPD.

EXAM:
CT CHEST WITH CONTRAST
TECHNIQUE: Multidetector CT imaging of the chest was performed during
intravenous contrast administration.
CONTRAST:  75mL OMNIPAQUE IOHEXOL 300 MG/ML  SOLN

[Series 2: routine chest with · axial · 0.73mm/px · z∈[+1300,+1600]mm · 12 of 176 slices shown, 15 images]
[im 13/176  mediastinal]
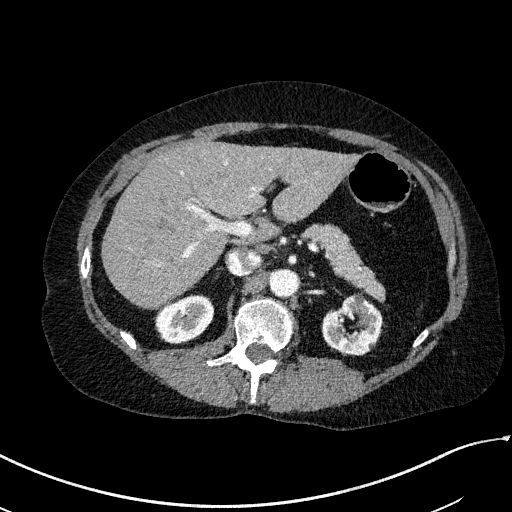
[im 13/176  lung]
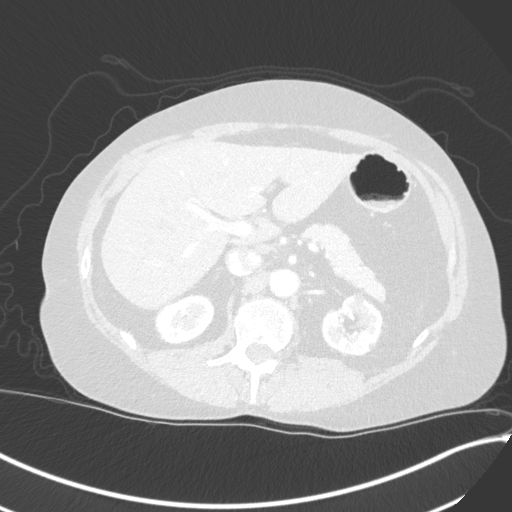
[im 26/176  lung]
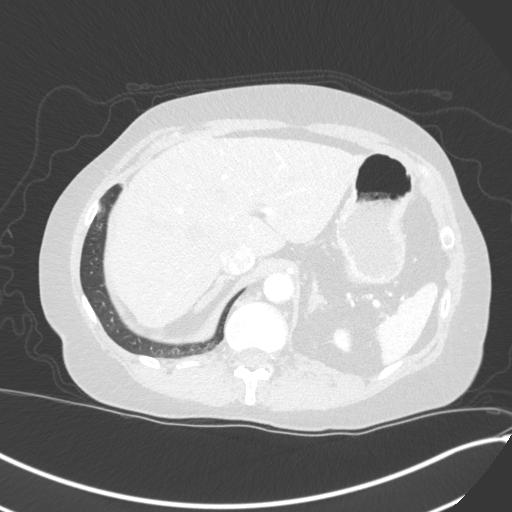
[im 39/176  lung]
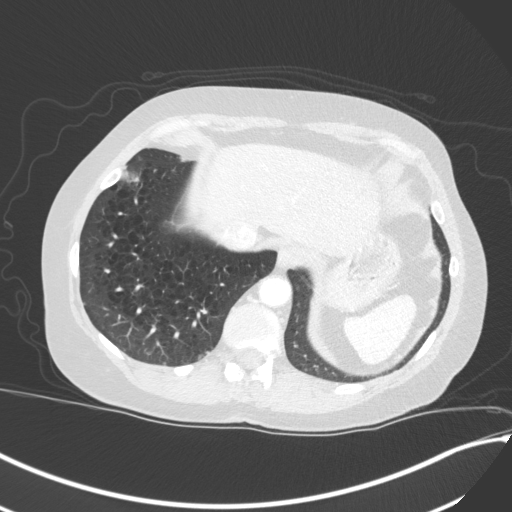
[im 52/176  lung]
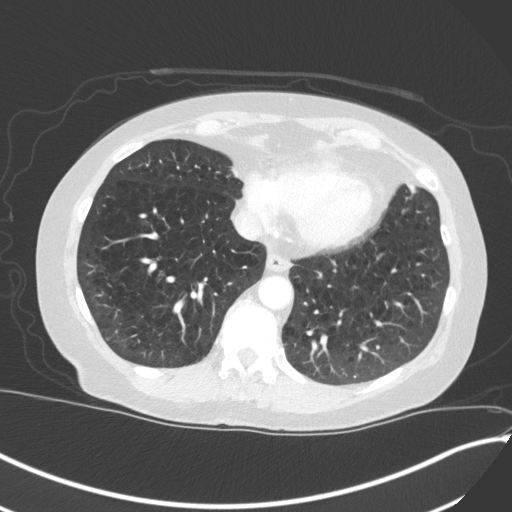
[im 65/176  mediastinal]
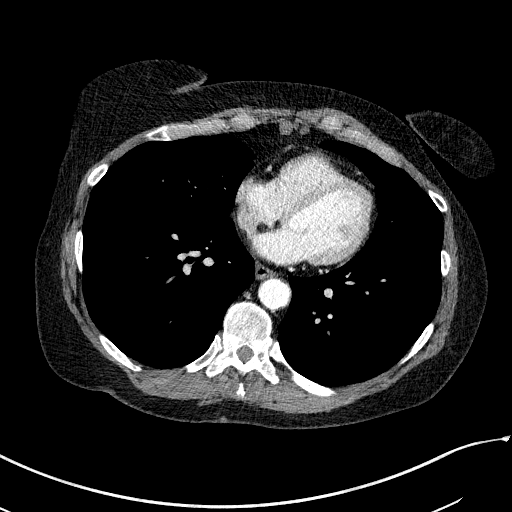
[im 65/176  lung]
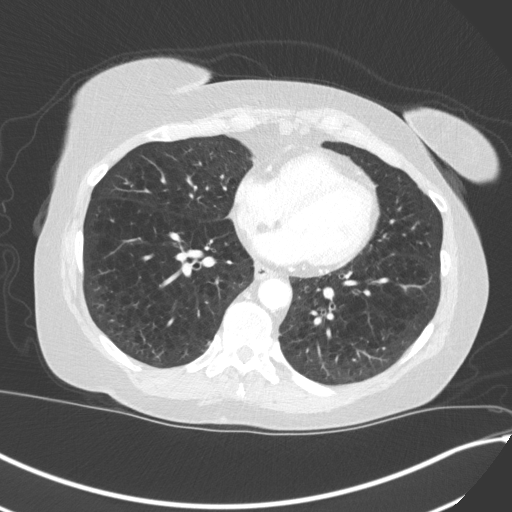
[im 78/176  lung]
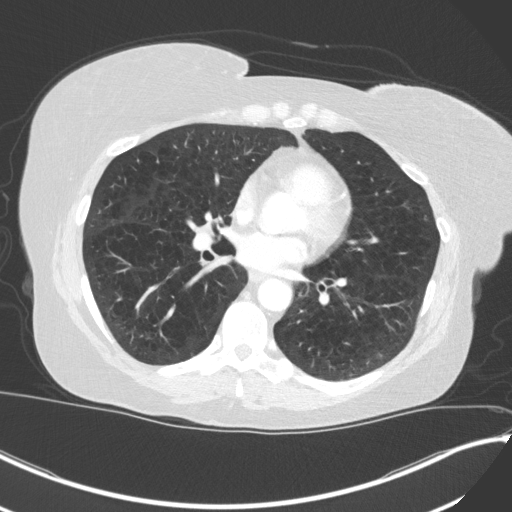
[im 98/176  lung]
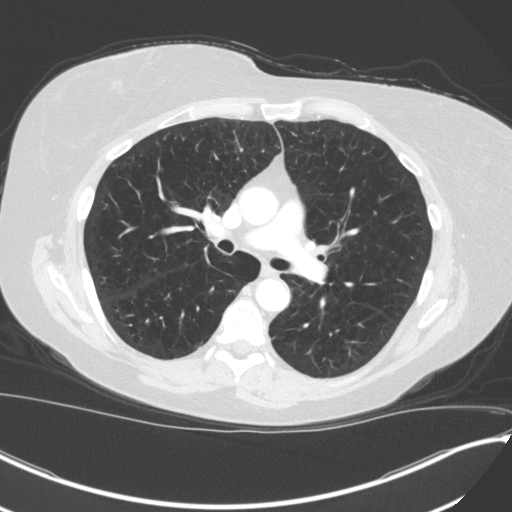
[im 111/176  lung]
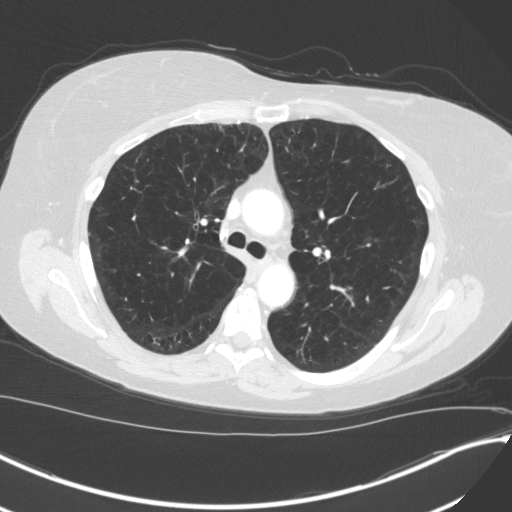
[im 124/176  mediastinal]
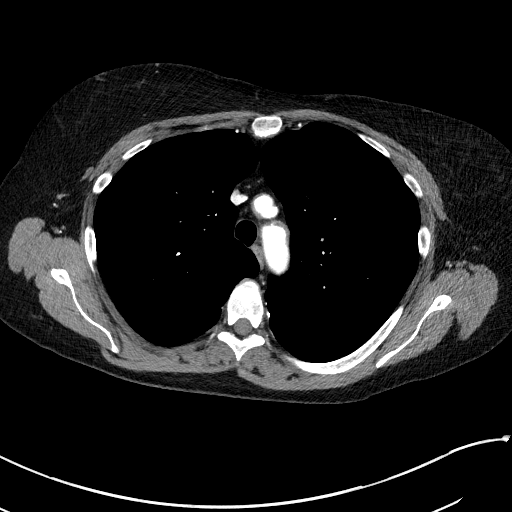
[im 124/176  lung]
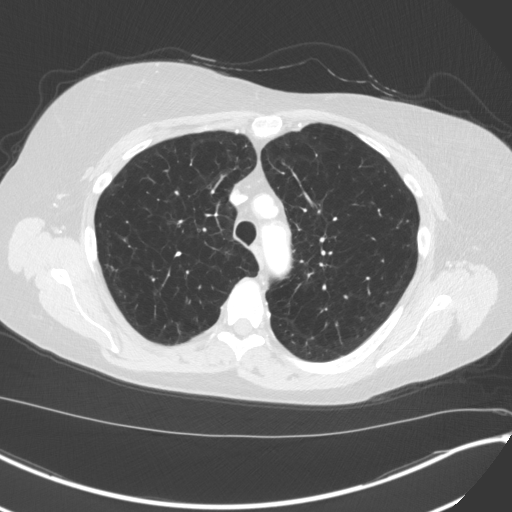
[im 137/176  lung]
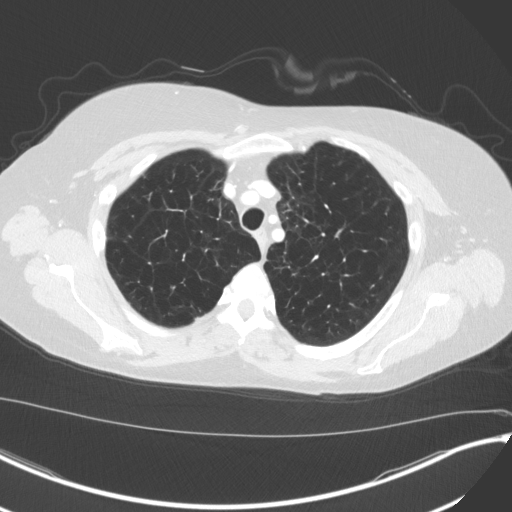
[im 150/176  lung]
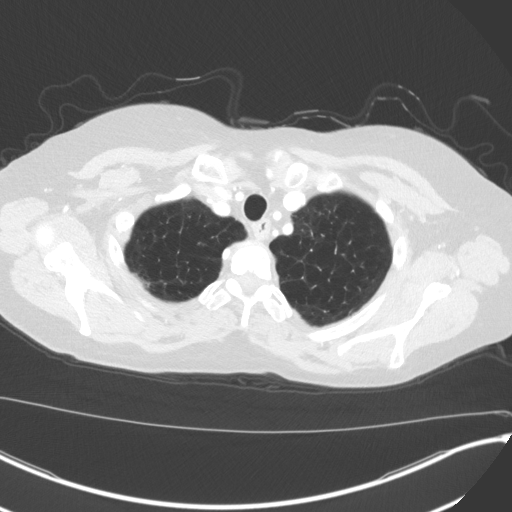
[im 163/176  lung]
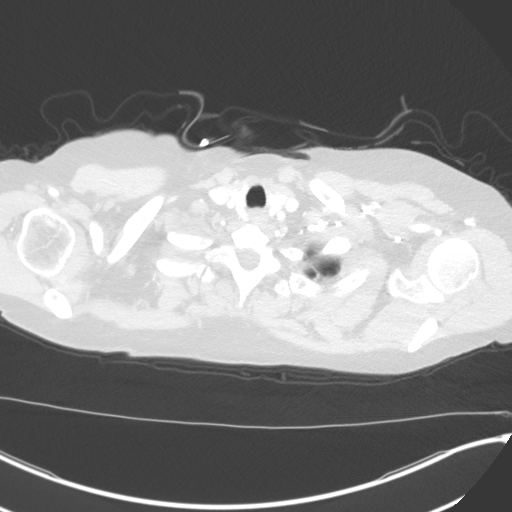

[Series 5: coronal · coronal · 0.68mm/px · 3 of 134 slices shown]
[im 27/134  lung]
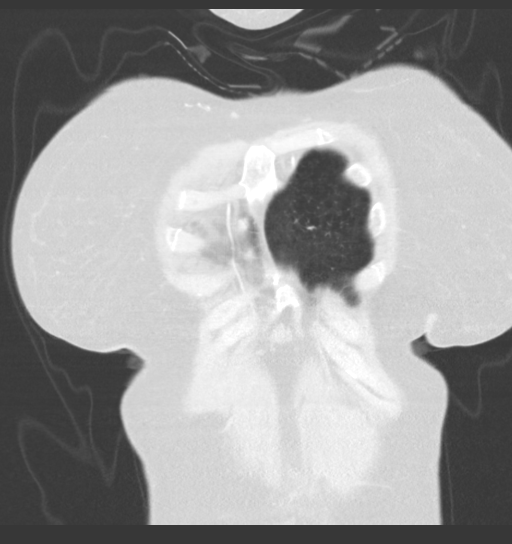
[im 54/134  lung]
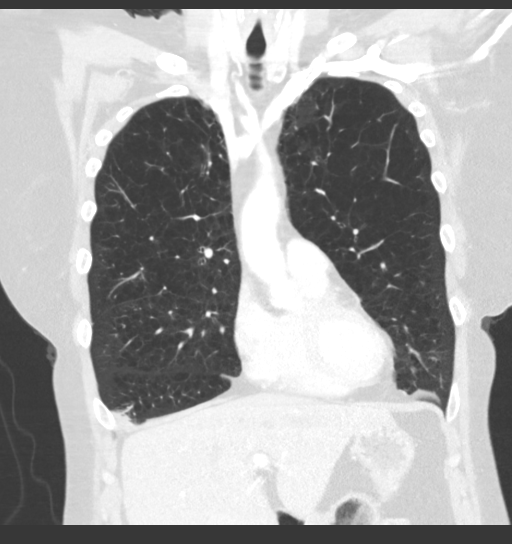
[im 80/134  lung]
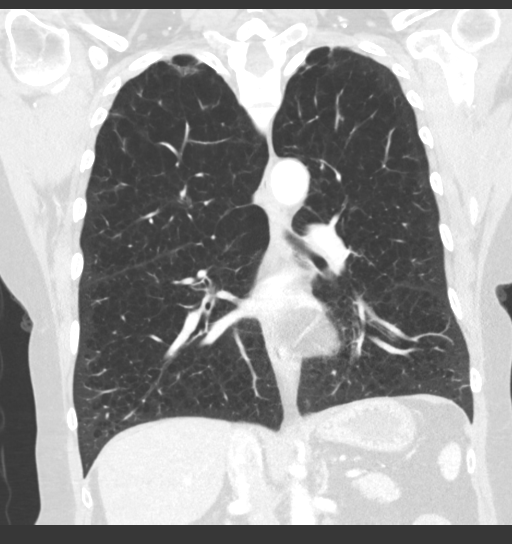

[15 of 36 positions shown; findings below may reference images not displayed]

FINDINGS: Cardiovascular: Normal heart size. No significant pericardial
effusion/thickening. Three-vessel coronary atherosclerosis.
Atherosclerotic nonaneurysmal thoracic aorta. Normal caliber
pulmonary arteries. No central pulmonary emboli.

Mediastinum/Nodes: Hypodense 1.1 cm left thyroid nodule.
Unremarkable esophagus. No pathologically enlarged axillary,
mediastinal or hilar lymph nodes.

Lungs/Pleura: No pneumothorax. No pleural effusion. Severe
centrilobular emphysema with mild diffuse bronchial wall thickening.
No acute consolidative airspace disease or lung masses. The
previously visualized numerous indistinct peribronchovascular
nodules scattered throughout both lungs on the 03/26/2021 chest CT
study have largely resolved, with a few scattered residual small
indistinct nodules in both lungs, largest 0.5 cm in the peripheral
right upper lobe (series 4/image 61), decreased in size from 0.8 cm
and decreased in density. Posterior right lower lobe 0.3 cm nodule
(series 4/image 134), slightly decreased from 0.4 cm. No significant
new or enlarging pulmonary nodules.

Upper abdomen: Simple partially visualized 3.9 cm interpolar right
renal cyst. Scattered subcentimeter hypodense upper left renal
cortical lesions are too small to characterize and require no
follow-up.

Musculoskeletal: No aggressive appearing focal osseous lesions. Mild
thoracic spondylosis.
IMPRESSION: 1. Previously visualized numerous indistinct peribronchovascular
nodules scattered throughout both lungs on the 03/26/2021 chest CT
study have largely resolved, with a few scattered residual small
indistinct nodules in both lungs, largest 0.5 cm in the peripheral
right upper lobe. No significant new or enlarging pulmonary nodules.
Findings are most compatible with resolving inflammatory nodularity.
Suggest final follow-up chest CT in 6-12 months in this high risk
patient.
2. Hypodense 1.1 cm left thyroid nodule. No follow-up imaging is
recommended. Reference: [HOSPITAL]. [DATE]): 143-50
3. Severe centrilobular emphysema with mild diffuse bronchial wall
thickening, compatible with the provided history of COPD.
4. Three-vessel coronary atherosclerosis.
5. Aortic Atherosclerosis ([id number]) and Emphysema ([id number]).

## 2023-05-31 ENCOUNTER — Other Ambulatory Visit (HOSPITAL_COMMUNITY): Payer: 59

## 2023-05-31 LAB — CBC WITH DIFFERENTIAL/PLATELET
Abs Immature Granulocytes: 0.04 10*3/uL (ref 0.00–0.07)
Basophils Absolute: 0.1 10*3/uL (ref 0.0–0.1)
Basophils Relative: 1 %
Eosinophils Absolute: 0.1 10*3/uL (ref 0.0–0.5)
Eosinophils Relative: 2 %
HCT: 29.3 % — ABNORMAL LOW (ref 36.0–46.0)
Hemoglobin: 8.8 g/dL — ABNORMAL LOW (ref 12.0–15.0)
Immature Granulocytes: 1 %
Lymphocytes Relative: 25 %
Lymphs Abs: 1.8 10*3/uL (ref 0.7–4.0)
MCH: 30.2 pg (ref 26.0–34.0)
MCHC: 30 g/dL (ref 30.0–36.0)
MCV: 100.7 fL — ABNORMAL HIGH (ref 80.0–100.0)
Monocytes Absolute: 1.1 10*3/uL — ABNORMAL HIGH (ref 0.1–1.0)
Monocytes Relative: 15 %
Neutro Abs: 4.1 10*3/uL (ref 1.7–7.7)
Neutrophils Relative %: 56 %
Platelets: 216 10*3/uL (ref 150–400)
RBC: 2.91 MIL/uL — ABNORMAL LOW (ref 3.87–5.11)
RDW: 15.9 % — ABNORMAL HIGH (ref 11.5–15.5)
WBC: 7.2 10*3/uL (ref 4.0–10.5)
nRBC: 0 % (ref 0.0–0.2)

## 2023-05-31 LAB — RENAL FUNCTION PANEL
Albumin: 2.6 g/dL — ABNORMAL LOW (ref 3.5–5.0)
Anion gap: 10 (ref 5–15)
BUN: 49 mg/dL — ABNORMAL HIGH (ref 8–23)
CO2: 28 mmol/L (ref 22–32)
Calcium: 9.3 mg/dL (ref 8.9–10.3)
Chloride: 96 mmol/L — ABNORMAL LOW (ref 98–111)
Creatinine, Ser: 1.7 mg/dL — ABNORMAL HIGH (ref 0.44–1.00)
GFR, Estimated: 34 mL/min — ABNORMAL LOW (ref >60)
Glucose, Bld: 132 mg/dL — ABNORMAL HIGH (ref 70–99)
Phosphorus: 4.8 mg/dL — ABNORMAL HIGH (ref 2.5–4.6)
Potassium: 4.6 mmol/L (ref 3.5–5.1)
Sodium: 134 mmol/L — ABNORMAL LOW (ref 135–145)

## 2023-05-31 LAB — MAGNESIUM: Magnesium: 1.9 mg/dL (ref 1.7–2.4)

## 2023-06-02 DIAGNOSIS — N186 End stage renal disease: Secondary | ICD-10-CM | POA: Diagnosis not present

## 2023-06-02 DIAGNOSIS — D631 Anemia in chronic kidney disease: Secondary | ICD-10-CM | POA: Diagnosis not present

## 2023-06-02 DIAGNOSIS — J449 Chronic obstructive pulmonary disease, unspecified: Secondary | ICD-10-CM | POA: Diagnosis not present

## 2023-06-02 DIAGNOSIS — I5030 Unspecified diastolic (congestive) heart failure: Secondary | ICD-10-CM | POA: Diagnosis not present

## 2023-06-02 DIAGNOSIS — J9621 Acute and chronic respiratory failure with hypoxia: Secondary | ICD-10-CM | POA: Diagnosis not present

## 2023-06-02 DIAGNOSIS — J9600 Acute respiratory failure, unspecified whether with hypoxia or hypercapnia: Secondary | ICD-10-CM | POA: Diagnosis not present

## 2023-06-02 DIAGNOSIS — N179 Acute kidney failure, unspecified: Secondary | ICD-10-CM | POA: Diagnosis not present

## 2023-06-02 DIAGNOSIS — J690 Pneumonitis due to inhalation of food and vomit: Secondary | ICD-10-CM | POA: Diagnosis not present

## 2023-06-02 DIAGNOSIS — Z930 Tracheostomy status: Secondary | ICD-10-CM | POA: Diagnosis not present

## 2023-06-02 DIAGNOSIS — N2581 Secondary hyperparathyroidism of renal origin: Secondary | ICD-10-CM | POA: Diagnosis not present

## 2023-06-02 LAB — COMPREHENSIVE METABOLIC PANEL
ALT: 11 U/L (ref 0–44)
AST: 10 U/L — ABNORMAL LOW (ref 15–41)
Albumin: 2.5 g/dL — ABNORMAL LOW (ref 3.5–5.0)
Alkaline Phosphatase: 87 U/L (ref 38–126)
Anion gap: 7 (ref 5–15)
BUN: 48 mg/dL — ABNORMAL HIGH (ref 8–23)
CO2: 28 mmol/L (ref 22–32)
Calcium: 8.9 mg/dL (ref 8.9–10.3)
Chloride: 98 mmol/L (ref 98–111)
Creatinine, Ser: 1.61 mg/dL — ABNORMAL HIGH (ref 0.44–1.00)
GFR, Estimated: 36 mL/min — ABNORMAL LOW (ref >60)
Glucose, Bld: 133 mg/dL — ABNORMAL HIGH (ref 70–99)
Potassium: 4.4 mmol/L (ref 3.5–5.1)
Sodium: 133 mmol/L — ABNORMAL LOW (ref 135–145)
Total Bilirubin: 0.3 mg/dL (ref 0.3–1.2)
Total Protein: 6 g/dL — ABNORMAL LOW (ref 6.5–8.1)

## 2023-06-02 LAB — CBC WITH DIFFERENTIAL/PLATELET
Abs Immature Granulocytes: 0.09 10*3/uL — ABNORMAL HIGH (ref 0.00–0.07)
Basophils Absolute: 0.1 10*3/uL (ref 0.0–0.1)
Basophils Relative: 1 %
Eosinophils Absolute: 0.1 10*3/uL (ref 0.0–0.5)
Eosinophils Relative: 1 %
HCT: 29.7 % — ABNORMAL LOW (ref 36.0–46.0)
Hemoglobin: 8.5 g/dL — ABNORMAL LOW (ref 12.0–15.0)
Immature Granulocytes: 1 %
Lymphocytes Relative: 18 %
Lymphs Abs: 1.7 10*3/uL (ref 0.7–4.0)
MCH: 28.7 pg (ref 26.0–34.0)
MCHC: 28.6 g/dL — ABNORMAL LOW (ref 30.0–36.0)
MCV: 100.3 fL — ABNORMAL HIGH (ref 80.0–100.0)
Monocytes Absolute: 1 10*3/uL (ref 0.1–1.0)
Monocytes Relative: 11 %
Neutro Abs: 6.4 10*3/uL (ref 1.7–7.7)
Neutrophils Relative %: 68 %
Platelets: 216 10*3/uL (ref 150–400)
RBC: 2.96 MIL/uL — ABNORMAL LOW (ref 3.87–5.11)
RDW: 15.9 % — ABNORMAL HIGH (ref 11.5–15.5)
WBC: 9.3 10*3/uL (ref 4.0–10.5)
nRBC: 0.2 % (ref 0.0–0.2)

## 2023-06-02 LAB — PHOSPHORUS: Phosphorus: 5.1 mg/dL — ABNORMAL HIGH (ref 2.5–4.6)

## 2023-06-02 LAB — MAGNESIUM: Magnesium: 1.8 mg/dL (ref 1.7–2.4)

## 2023-06-03 ENCOUNTER — Other Ambulatory Visit (HOSPITAL_COMMUNITY): Payer: 59

## 2023-06-03 DIAGNOSIS — J9621 Acute and chronic respiratory failure with hypoxia: Secondary | ICD-10-CM | POA: Diagnosis not present

## 2023-06-03 DIAGNOSIS — J690 Pneumonitis due to inhalation of food and vomit: Secondary | ICD-10-CM | POA: Diagnosis not present

## 2023-06-03 DIAGNOSIS — J449 Chronic obstructive pulmonary disease, unspecified: Secondary | ICD-10-CM | POA: Diagnosis not present

## 2023-06-03 DIAGNOSIS — I5030 Unspecified diastolic (congestive) heart failure: Secondary | ICD-10-CM | POA: Diagnosis not present

## 2023-06-03 DIAGNOSIS — Z930 Tracheostomy status: Secondary | ICD-10-CM | POA: Diagnosis not present

## 2023-06-03 DIAGNOSIS — N186 End stage renal disease: Secondary | ICD-10-CM | POA: Diagnosis not present

## 2023-06-03 LAB — TRIGLYCERIDES: Triglycerides: 113 mg/dL (ref <150)

## 2023-06-03 LAB — CBC WITH DIFFERENTIAL/PLATELET
Abs Immature Granulocytes: 0.08 10*3/uL — ABNORMAL HIGH (ref 0.00–0.07)
Basophils Absolute: 0.1 10*3/uL (ref 0.0–0.1)
Basophils Relative: 1 %
Eosinophils Absolute: 0.1 10*3/uL (ref 0.0–0.5)
Eosinophils Relative: 1 %
HCT: 29.3 % — ABNORMAL LOW (ref 36.0–46.0)
Hemoglobin: 8.6 g/dL — ABNORMAL LOW (ref 12.0–15.0)
Immature Granulocytes: 1 %
Lymphocytes Relative: 19 %
Lymphs Abs: 1.6 10*3/uL (ref 0.7–4.0)
MCH: 29.4 pg (ref 26.0–34.0)
MCHC: 29.4 g/dL — ABNORMAL LOW (ref 30.0–36.0)
MCV: 100 fL (ref 80.0–100.0)
Monocytes Absolute: 0.8 10*3/uL (ref 0.1–1.0)
Monocytes Relative: 9 %
Neutro Abs: 5.9 10*3/uL (ref 1.7–7.7)
Neutrophils Relative %: 69 %
Platelets: 228 10*3/uL (ref 150–400)
RBC: 2.93 MIL/uL — ABNORMAL LOW (ref 3.87–5.11)
RDW: 15.8 % — ABNORMAL HIGH (ref 11.5–15.5)
WBC: 8.5 10*3/uL (ref 4.0–10.5)
nRBC: 0 % (ref 0.0–0.2)

## 2023-06-03 LAB — MAGNESIUM: Magnesium: 1.8 mg/dL (ref 1.7–2.4)

## 2023-06-03 LAB — RENAL FUNCTION PANEL
Albumin: 2.5 g/dL — ABNORMAL LOW (ref 3.5–5.0)
Anion gap: 7 (ref 5–15)
BUN: 53 mg/dL — ABNORMAL HIGH (ref 8–23)
CO2: 31 mmol/L (ref 22–32)
Calcium: 9.2 mg/dL (ref 8.9–10.3)
Chloride: 96 mmol/L — ABNORMAL LOW (ref 98–111)
Creatinine, Ser: 1.58 mg/dL — ABNORMAL HIGH (ref 0.44–1.00)
GFR, Estimated: 37 mL/min — ABNORMAL LOW (ref >60)
Glucose, Bld: 127 mg/dL — ABNORMAL HIGH (ref 70–99)
Phosphorus: 5.5 mg/dL — ABNORMAL HIGH (ref 2.5–4.6)
Potassium: 4.3 mmol/L (ref 3.5–5.1)
Sodium: 134 mmol/L — ABNORMAL LOW (ref 135–145)

## 2023-06-04 DIAGNOSIS — N179 Acute kidney failure, unspecified: Secondary | ICD-10-CM | POA: Diagnosis not present

## 2023-06-04 DIAGNOSIS — I5030 Unspecified diastolic (congestive) heart failure: Secondary | ICD-10-CM

## 2023-06-04 DIAGNOSIS — N2581 Secondary hyperparathyroidism of renal origin: Secondary | ICD-10-CM | POA: Diagnosis not present

## 2023-06-04 DIAGNOSIS — J9600 Acute respiratory failure, unspecified whether with hypoxia or hypercapnia: Secondary | ICD-10-CM | POA: Diagnosis not present

## 2023-06-04 DIAGNOSIS — Z930 Tracheostomy status: Secondary | ICD-10-CM

## 2023-06-04 DIAGNOSIS — N186 End stage renal disease: Secondary | ICD-10-CM

## 2023-06-04 DIAGNOSIS — J9621 Acute and chronic respiratory failure with hypoxia: Secondary | ICD-10-CM

## 2023-06-04 DIAGNOSIS — D631 Anemia in chronic kidney disease: Secondary | ICD-10-CM | POA: Diagnosis not present

## 2023-06-04 DIAGNOSIS — J690 Pneumonitis due to inhalation of food and vomit: Secondary | ICD-10-CM

## 2023-06-04 DIAGNOSIS — J449 Chronic obstructive pulmonary disease, unspecified: Secondary | ICD-10-CM

## 2023-06-05 DIAGNOSIS — Z930 Tracheostomy status: Secondary | ICD-10-CM

## 2023-06-05 DIAGNOSIS — N186 End stage renal disease: Secondary | ICD-10-CM

## 2023-06-05 DIAGNOSIS — J449 Chronic obstructive pulmonary disease, unspecified: Secondary | ICD-10-CM

## 2023-06-05 DIAGNOSIS — J690 Pneumonitis due to inhalation of food and vomit: Secondary | ICD-10-CM

## 2023-06-05 DIAGNOSIS — I5030 Unspecified diastolic (congestive) heart failure: Secondary | ICD-10-CM

## 2023-06-05 DIAGNOSIS — J9621 Acute and chronic respiratory failure with hypoxia: Secondary | ICD-10-CM

## 2023-06-05 LAB — RENAL FUNCTION PANEL
Albumin: 2.5 g/dL — ABNORMAL LOW (ref 3.5–5.0)
Anion gap: 11 (ref 5–15)
BUN: 50 mg/dL — ABNORMAL HIGH (ref 8–23)
CO2: 28 mmol/L (ref 22–32)
Calcium: 8.9 mg/dL (ref 8.9–10.3)
Chloride: 98 mmol/L (ref 98–111)
Creatinine, Ser: 1.61 mg/dL — ABNORMAL HIGH (ref 0.44–1.00)
GFR, Estimated: 36 mL/min — ABNORMAL LOW (ref >60)
Glucose, Bld: 87 mg/dL (ref 70–99)
Phosphorus: 4.3 mg/dL (ref 2.5–4.6)
Potassium: 5.1 mmol/L (ref 3.5–5.1)
Sodium: 137 mmol/L (ref 135–145)

## 2023-06-05 LAB — CBC WITH DIFFERENTIAL/PLATELET
Abs Immature Granulocytes: 0.08 10*3/uL — ABNORMAL HIGH (ref 0.00–0.07)
Basophils Absolute: 0 10*3/uL (ref 0.0–0.1)
Basophils Relative: 1 %
Eosinophils Absolute: 0.1 10*3/uL (ref 0.0–0.5)
Eosinophils Relative: 1 %
HCT: 26.3 % — ABNORMAL LOW (ref 36.0–46.0)
Hemoglobin: 7.7 g/dL — ABNORMAL LOW (ref 12.0–15.0)
Immature Granulocytes: 1 %
Lymphocytes Relative: 32 %
Lymphs Abs: 2.8 10*3/uL (ref 0.7–4.0)
MCH: 29.2 pg (ref 26.0–34.0)
MCHC: 29.3 g/dL — ABNORMAL LOW (ref 30.0–36.0)
MCV: 99.6 fL (ref 80.0–100.0)
Monocytes Absolute: 0.9 10*3/uL (ref 0.1–1.0)
Monocytes Relative: 10 %
Neutro Abs: 4.8 10*3/uL (ref 1.7–7.7)
Neutrophils Relative %: 55 %
Platelets: 144 10*3/uL — ABNORMAL LOW (ref 150–400)
RBC: 2.64 MIL/uL — ABNORMAL LOW (ref 3.87–5.11)
RDW: 15.7 % — ABNORMAL HIGH (ref 11.5–15.5)
WBC: 8.6 10*3/uL (ref 4.0–10.5)
nRBC: 0.2 % (ref 0.0–0.2)

## 2023-06-06 DIAGNOSIS — I5030 Unspecified diastolic (congestive) heart failure: Secondary | ICD-10-CM | POA: Diagnosis not present

## 2023-06-06 DIAGNOSIS — J449 Chronic obstructive pulmonary disease, unspecified: Secondary | ICD-10-CM | POA: Diagnosis not present

## 2023-06-06 DIAGNOSIS — D631 Anemia in chronic kidney disease: Secondary | ICD-10-CM | POA: Diagnosis not present

## 2023-06-06 DIAGNOSIS — Z930 Tracheostomy status: Secondary | ICD-10-CM | POA: Diagnosis not present

## 2023-06-06 DIAGNOSIS — J9621 Acute and chronic respiratory failure with hypoxia: Secondary | ICD-10-CM | POA: Diagnosis not present

## 2023-06-06 DIAGNOSIS — N179 Acute kidney failure, unspecified: Secondary | ICD-10-CM | POA: Diagnosis not present

## 2023-06-06 DIAGNOSIS — N186 End stage renal disease: Secondary | ICD-10-CM | POA: Diagnosis not present

## 2023-06-06 DIAGNOSIS — J690 Pneumonitis due to inhalation of food and vomit: Secondary | ICD-10-CM | POA: Diagnosis not present

## 2023-06-06 DIAGNOSIS — N2581 Secondary hyperparathyroidism of renal origin: Secondary | ICD-10-CM | POA: Diagnosis not present

## 2023-06-06 DIAGNOSIS — J9600 Acute respiratory failure, unspecified whether with hypoxia or hypercapnia: Secondary | ICD-10-CM | POA: Diagnosis not present

## 2023-06-07 ENCOUNTER — Other Ambulatory Visit (HOSPITAL_COMMUNITY): Payer: 59

## 2023-06-07 DIAGNOSIS — N186 End stage renal disease: Secondary | ICD-10-CM

## 2023-06-07 DIAGNOSIS — J9621 Acute and chronic respiratory failure with hypoxia: Secondary | ICD-10-CM

## 2023-06-07 DIAGNOSIS — J449 Chronic obstructive pulmonary disease, unspecified: Secondary | ICD-10-CM

## 2023-06-07 DIAGNOSIS — J690 Pneumonitis due to inhalation of food and vomit: Secondary | ICD-10-CM

## 2023-06-07 DIAGNOSIS — I5030 Unspecified diastolic (congestive) heart failure: Secondary | ICD-10-CM

## 2023-06-07 DIAGNOSIS — Z930 Tracheostomy status: Secondary | ICD-10-CM

## 2023-06-07 LAB — CBC WITH DIFFERENTIAL/PLATELET
Abs Immature Granulocytes: 0.03 10*3/uL (ref 0.00–0.07)
Basophils Absolute: 0 10*3/uL (ref 0.0–0.1)
Basophils Relative: 1 %
Eosinophils Absolute: 0.1 10*3/uL (ref 0.0–0.5)
Eosinophils Relative: 1 %
HCT: 23.8 % — ABNORMAL LOW (ref 36.0–46.0)
Hemoglobin: 7 g/dL — ABNORMAL LOW (ref 12.0–15.0)
Immature Granulocytes: 1 %
Lymphocytes Relative: 29 %
Lymphs Abs: 1.7 10*3/uL (ref 0.7–4.0)
MCH: 28.9 pg (ref 26.0–34.0)
MCHC: 29.4 g/dL — ABNORMAL LOW (ref 30.0–36.0)
MCV: 98.3 fL (ref 80.0–100.0)
Monocytes Absolute: 0.7 10*3/uL (ref 0.1–1.0)
Monocytes Relative: 13 %
Neutro Abs: 3.1 10*3/uL (ref 1.7–7.7)
Neutrophils Relative %: 55 %
Platelets: 116 10*3/uL — ABNORMAL LOW (ref 150–400)
RBC: 2.42 MIL/uL — ABNORMAL LOW (ref 3.87–5.11)
RDW: 15.8 % — ABNORMAL HIGH (ref 11.5–15.5)
WBC: 5.6 10*3/uL (ref 4.0–10.5)
nRBC: 0 % (ref 0.0–0.2)

## 2023-06-07 LAB — RENAL FUNCTION PANEL
Albumin: 2.2 g/dL — ABNORMAL LOW (ref 3.5–5.0)
Anion gap: 5 (ref 5–15)
BUN: 43 mg/dL — ABNORMAL HIGH (ref 8–23)
CO2: 28 mmol/L (ref 22–32)
Calcium: 8.6 mg/dL — ABNORMAL LOW (ref 8.9–10.3)
Chloride: 97 mmol/L — ABNORMAL LOW (ref 98–111)
Creatinine, Ser: 1.37 mg/dL — ABNORMAL HIGH (ref 0.44–1.00)
GFR, Estimated: 44 mL/min — ABNORMAL LOW (ref >60)
Glucose, Bld: 125 mg/dL — ABNORMAL HIGH (ref 70–99)
Phosphorus: 5.4 mg/dL — ABNORMAL HIGH (ref 2.5–4.6)
Potassium: 4.8 mmol/L (ref 3.5–5.1)
Sodium: 130 mmol/L — ABNORMAL LOW (ref 135–145)

## 2023-06-08 ENCOUNTER — Other Ambulatory Visit (HOSPITAL_COMMUNITY): Payer: 59

## 2023-06-08 DIAGNOSIS — I5030 Unspecified diastolic (congestive) heart failure: Secondary | ICD-10-CM

## 2023-06-08 DIAGNOSIS — J9621 Acute and chronic respiratory failure with hypoxia: Secondary | ICD-10-CM

## 2023-06-08 DIAGNOSIS — N186 End stage renal disease: Secondary | ICD-10-CM

## 2023-06-08 DIAGNOSIS — Z930 Tracheostomy status: Secondary | ICD-10-CM

## 2023-06-08 DIAGNOSIS — J690 Pneumonitis due to inhalation of food and vomit: Secondary | ICD-10-CM

## 2023-06-08 DIAGNOSIS — J449 Chronic obstructive pulmonary disease, unspecified: Secondary | ICD-10-CM

## 2023-06-09 DIAGNOSIS — J9621 Acute and chronic respiratory failure with hypoxia: Secondary | ICD-10-CM | POA: Diagnosis not present

## 2023-06-09 DIAGNOSIS — J9600 Acute respiratory failure, unspecified whether with hypoxia or hypercapnia: Secondary | ICD-10-CM | POA: Diagnosis not present

## 2023-06-09 DIAGNOSIS — N179 Acute kidney failure, unspecified: Secondary | ICD-10-CM | POA: Diagnosis not present

## 2023-06-09 DIAGNOSIS — N2581 Secondary hyperparathyroidism of renal origin: Secondary | ICD-10-CM | POA: Diagnosis not present

## 2023-06-09 DIAGNOSIS — J690 Pneumonitis due to inhalation of food and vomit: Secondary | ICD-10-CM | POA: Diagnosis not present

## 2023-06-09 DIAGNOSIS — J449 Chronic obstructive pulmonary disease, unspecified: Secondary | ICD-10-CM | POA: Diagnosis not present

## 2023-06-09 DIAGNOSIS — D631 Anemia in chronic kidney disease: Secondary | ICD-10-CM | POA: Diagnosis not present

## 2023-06-09 DIAGNOSIS — I5030 Unspecified diastolic (congestive) heart failure: Secondary | ICD-10-CM | POA: Diagnosis not present

## 2023-06-09 DIAGNOSIS — Z930 Tracheostomy status: Secondary | ICD-10-CM | POA: Diagnosis not present

## 2023-06-09 DIAGNOSIS — N186 End stage renal disease: Secondary | ICD-10-CM | POA: Diagnosis not present

## 2023-06-10 ENCOUNTER — Other Ambulatory Visit (HOSPITAL_COMMUNITY): Payer: 59

## 2023-06-10 DIAGNOSIS — N186 End stage renal disease: Secondary | ICD-10-CM | POA: Diagnosis not present

## 2023-06-10 DIAGNOSIS — J449 Chronic obstructive pulmonary disease, unspecified: Secondary | ICD-10-CM | POA: Diagnosis not present

## 2023-06-10 DIAGNOSIS — Z930 Tracheostomy status: Secondary | ICD-10-CM | POA: Diagnosis not present

## 2023-06-10 DIAGNOSIS — I5030 Unspecified diastolic (congestive) heart failure: Secondary | ICD-10-CM | POA: Diagnosis not present

## 2023-06-10 DIAGNOSIS — J9621 Acute and chronic respiratory failure with hypoxia: Secondary | ICD-10-CM | POA: Diagnosis not present

## 2023-06-10 DIAGNOSIS — J690 Pneumonitis due to inhalation of food and vomit: Secondary | ICD-10-CM | POA: Diagnosis not present

## 2023-06-10 LAB — CBC WITH DIFFERENTIAL/PLATELET
Abs Immature Granulocytes: 0.03 10*3/uL (ref 0.00–0.07)
Basophils Absolute: 0 10*3/uL (ref 0.0–0.1)
Basophils Relative: 0 %
Eosinophils Absolute: 0.2 10*3/uL (ref 0.0–0.5)
Eosinophils Relative: 3 %
HCT: 24 % — ABNORMAL LOW (ref 36.0–46.0)
Hemoglobin: 7.1 g/dL — ABNORMAL LOW (ref 12.0–15.0)
Immature Granulocytes: 0 %
Lymphocytes Relative: 23 %
Lymphs Abs: 1.6 10*3/uL (ref 0.7–4.0)
MCH: 29 pg (ref 26.0–34.0)
MCHC: 29.6 g/dL — ABNORMAL LOW (ref 30.0–36.0)
MCV: 98 fL (ref 80.0–100.0)
Monocytes Absolute: 0.7 10*3/uL (ref 0.1–1.0)
Monocytes Relative: 10 %
Neutro Abs: 4.3 10*3/uL (ref 1.7–7.7)
Neutrophils Relative %: 64 %
Platelets: 153 10*3/uL (ref 150–400)
RBC: 2.45 MIL/uL — ABNORMAL LOW (ref 3.87–5.11)
RDW: 15.6 % — ABNORMAL HIGH (ref 11.5–15.5)
WBC: 6.8 10*3/uL (ref 4.0–10.5)
nRBC: 0 % (ref 0.0–0.2)

## 2023-06-10 LAB — RENAL FUNCTION PANEL
Albumin: 2.4 g/dL — ABNORMAL LOW (ref 3.5–5.0)
Anion gap: 7 (ref 5–15)
BUN: 50 mg/dL — ABNORMAL HIGH (ref 8–23)
CO2: 30 mmol/L (ref 22–32)
Calcium: 9.7 mg/dL (ref 8.9–10.3)
Chloride: 97 mmol/L — ABNORMAL LOW (ref 98–111)
Creatinine, Ser: 1.34 mg/dL — ABNORMAL HIGH (ref 0.44–1.00)
GFR, Estimated: 45 mL/min — ABNORMAL LOW (ref >60)
Glucose, Bld: 135 mg/dL — ABNORMAL HIGH (ref 70–99)
Phosphorus: 4.9 mg/dL — ABNORMAL HIGH (ref 2.5–4.6)
Potassium: 4.9 mmol/L (ref 3.5–5.1)
Sodium: 134 mmol/L — ABNORMAL LOW (ref 135–145)

## 2023-06-11 DIAGNOSIS — J690 Pneumonitis due to inhalation of food and vomit: Secondary | ICD-10-CM | POA: Diagnosis not present

## 2023-06-11 DIAGNOSIS — J9621 Acute and chronic respiratory failure with hypoxia: Secondary | ICD-10-CM | POA: Diagnosis not present

## 2023-06-11 DIAGNOSIS — N2581 Secondary hyperparathyroidism of renal origin: Secondary | ICD-10-CM | POA: Diagnosis not present

## 2023-06-11 DIAGNOSIS — N179 Acute kidney failure, unspecified: Secondary | ICD-10-CM | POA: Diagnosis not present

## 2023-06-11 DIAGNOSIS — J449 Chronic obstructive pulmonary disease, unspecified: Secondary | ICD-10-CM | POA: Diagnosis not present

## 2023-06-11 DIAGNOSIS — J9600 Acute respiratory failure, unspecified whether with hypoxia or hypercapnia: Secondary | ICD-10-CM | POA: Diagnosis not present

## 2023-06-11 DIAGNOSIS — D631 Anemia in chronic kidney disease: Secondary | ICD-10-CM | POA: Diagnosis not present

## 2023-06-11 DIAGNOSIS — N186 End stage renal disease: Secondary | ICD-10-CM | POA: Diagnosis not present

## 2023-06-11 DIAGNOSIS — Z930 Tracheostomy status: Secondary | ICD-10-CM | POA: Diagnosis not present

## 2023-06-11 DIAGNOSIS — I5030 Unspecified diastolic (congestive) heart failure: Secondary | ICD-10-CM | POA: Diagnosis not present

## 2023-06-11 LAB — TRIGLYCERIDES: Triglycerides: 76 mg/dL (ref <150)

## 2023-06-12 ENCOUNTER — Other Ambulatory Visit (HOSPITAL_COMMUNITY): Payer: 59

## 2023-06-12 DIAGNOSIS — Z930 Tracheostomy status: Secondary | ICD-10-CM | POA: Diagnosis not present

## 2023-06-12 DIAGNOSIS — N186 End stage renal disease: Secondary | ICD-10-CM | POA: Diagnosis not present

## 2023-06-12 DIAGNOSIS — I5030 Unspecified diastolic (congestive) heart failure: Secondary | ICD-10-CM | POA: Diagnosis not present

## 2023-06-12 DIAGNOSIS — J449 Chronic obstructive pulmonary disease, unspecified: Secondary | ICD-10-CM | POA: Diagnosis not present

## 2023-06-12 DIAGNOSIS — J9621 Acute and chronic respiratory failure with hypoxia: Secondary | ICD-10-CM | POA: Diagnosis not present

## 2023-06-12 DIAGNOSIS — J690 Pneumonitis due to inhalation of food and vomit: Secondary | ICD-10-CM | POA: Diagnosis not present

## 2023-06-12 LAB — CBC WITH DIFFERENTIAL/PLATELET
Abs Immature Granulocytes: 0.05 10*3/uL (ref 0.00–0.07)
Basophils Absolute: 0 10*3/uL (ref 0.0–0.1)
Basophils Relative: 0 %
Eosinophils Absolute: 0.1 10*3/uL (ref 0.0–0.5)
Eosinophils Relative: 2 %
HCT: 24.7 % — ABNORMAL LOW (ref 36.0–46.0)
Hemoglobin: 7.2 g/dL — ABNORMAL LOW (ref 12.0–15.0)
Immature Granulocytes: 1 %
Lymphocytes Relative: 24 %
Lymphs Abs: 1.6 10*3/uL (ref 0.7–4.0)
MCH: 28.7 pg (ref 26.0–34.0)
MCHC: 29.1 g/dL — ABNORMAL LOW (ref 30.0–36.0)
MCV: 98.4 fL (ref 80.0–100.0)
Monocytes Absolute: 1.2 10*3/uL — ABNORMAL HIGH (ref 0.1–1.0)
Monocytes Relative: 17 %
Neutro Abs: 3.7 10*3/uL (ref 1.7–7.7)
Neutrophils Relative %: 56 %
Platelets: 190 10*3/uL (ref 150–400)
RBC: 2.51 MIL/uL — ABNORMAL LOW (ref 3.87–5.11)
RDW: 15.8 % — ABNORMAL HIGH (ref 11.5–15.5)
WBC: 6.6 10*3/uL (ref 4.0–10.5)
nRBC: 0 % (ref 0.0–0.2)

## 2023-06-12 LAB — RENAL FUNCTION PANEL
Albumin: 2.5 g/dL — ABNORMAL LOW (ref 3.5–5.0)
Anion gap: 6 (ref 5–15)
BUN: 47 mg/dL — ABNORMAL HIGH (ref 8–23)
CO2: 29 mmol/L (ref 22–32)
Calcium: 9.3 mg/dL (ref 8.9–10.3)
Chloride: 97 mmol/L — ABNORMAL LOW (ref 98–111)
Creatinine, Ser: 1.34 mg/dL — ABNORMAL HIGH (ref 0.44–1.00)
GFR, Estimated: 45 mL/min — ABNORMAL LOW (ref >60)
Glucose, Bld: 127 mg/dL — ABNORMAL HIGH (ref 70–99)
Phosphorus: 2.7 mg/dL (ref 2.5–4.6)
Potassium: 3.8 mmol/L (ref 3.5–5.1)
Sodium: 132 mmol/L — ABNORMAL LOW (ref 135–145)

## 2023-06-13 DIAGNOSIS — I5030 Unspecified diastolic (congestive) heart failure: Secondary | ICD-10-CM | POA: Diagnosis not present

## 2023-06-13 DIAGNOSIS — D631 Anemia in chronic kidney disease: Secondary | ICD-10-CM | POA: Diagnosis not present

## 2023-06-13 DIAGNOSIS — J690 Pneumonitis due to inhalation of food and vomit: Secondary | ICD-10-CM | POA: Diagnosis not present

## 2023-06-13 DIAGNOSIS — N186 End stage renal disease: Secondary | ICD-10-CM | POA: Diagnosis not present

## 2023-06-13 DIAGNOSIS — N2581 Secondary hyperparathyroidism of renal origin: Secondary | ICD-10-CM | POA: Diagnosis not present

## 2023-06-13 DIAGNOSIS — Z930 Tracheostomy status: Secondary | ICD-10-CM | POA: Diagnosis not present

## 2023-06-13 DIAGNOSIS — N179 Acute kidney failure, unspecified: Secondary | ICD-10-CM | POA: Diagnosis not present

## 2023-06-13 DIAGNOSIS — J9600 Acute respiratory failure, unspecified whether with hypoxia or hypercapnia: Secondary | ICD-10-CM | POA: Diagnosis not present

## 2023-06-13 DIAGNOSIS — J449 Chronic obstructive pulmonary disease, unspecified: Secondary | ICD-10-CM | POA: Diagnosis not present

## 2023-06-13 DIAGNOSIS — J9621 Acute and chronic respiratory failure with hypoxia: Secondary | ICD-10-CM | POA: Diagnosis not present

## 2023-06-14 LAB — RENAL FUNCTION PANEL
Albumin: 2.3 g/dL — ABNORMAL LOW (ref 3.5–5.0)
Anion gap: 6 (ref 5–15)
BUN: 51 mg/dL — ABNORMAL HIGH (ref 8–23)
CO2: 26 mmol/L (ref 22–32)
Calcium: 9.1 mg/dL (ref 8.9–10.3)
Chloride: 98 mmol/L (ref 98–111)
Creatinine, Ser: 1.57 mg/dL — ABNORMAL HIGH (ref 0.44–1.00)
GFR, Estimated: 37 mL/min — ABNORMAL LOW (ref >60)
Glucose, Bld: 136 mg/dL — ABNORMAL HIGH (ref 70–99)
Phosphorus: 2.1 mg/dL — ABNORMAL LOW (ref 2.5–4.6)
Potassium: 3.8 mmol/L (ref 3.5–5.1)
Sodium: 130 mmol/L — ABNORMAL LOW (ref 135–145)

## 2023-06-14 LAB — CBC WITH DIFFERENTIAL/PLATELET
Abs Immature Granulocytes: 0.13 10*3/uL — ABNORMAL HIGH (ref 0.00–0.07)
Basophils Absolute: 0 10*3/uL (ref 0.0–0.1)
Basophils Relative: 0 %
Eosinophils Absolute: 0 10*3/uL (ref 0.0–0.5)
Eosinophils Relative: 0 %
HCT: 24.8 % — ABNORMAL LOW (ref 36.0–46.0)
Hemoglobin: 7.2 g/dL — ABNORMAL LOW (ref 12.0–15.0)
Immature Granulocytes: 1 %
Lymphocytes Relative: 24 %
Lymphs Abs: 2.9 10*3/uL (ref 0.7–4.0)
MCH: 29.9 pg (ref 26.0–34.0)
MCHC: 29 g/dL — ABNORMAL LOW (ref 30.0–36.0)
MCV: 102.9 fL — ABNORMAL HIGH (ref 80.0–100.0)
Monocytes Absolute: 2.4 10*3/uL — ABNORMAL HIGH (ref 0.1–1.0)
Monocytes Relative: 19 %
Neutro Abs: 6.7 10*3/uL (ref 1.7–7.7)
Neutrophils Relative %: 56 %
Platelets: 204 10*3/uL (ref 150–400)
RBC: 2.41 MIL/uL — ABNORMAL LOW (ref 3.87–5.11)
RDW: 15.9 % — ABNORMAL HIGH (ref 11.5–15.5)
WBC: 12.2 10*3/uL — ABNORMAL HIGH (ref 4.0–10.5)
nRBC: 0.5 % — ABNORMAL HIGH (ref 0.0–0.2)

## 2023-06-14 LAB — TRIGLYCERIDES: Triglycerides: 49 mg/dL (ref <150)

## 2023-06-15 DIAGNOSIS — I5030 Unspecified diastolic (congestive) heart failure: Secondary | ICD-10-CM | POA: Diagnosis not present

## 2023-06-15 DIAGNOSIS — N186 End stage renal disease: Secondary | ICD-10-CM | POA: Diagnosis not present

## 2023-06-15 DIAGNOSIS — J9621 Acute and chronic respiratory failure with hypoxia: Secondary | ICD-10-CM | POA: Diagnosis not present

## 2023-06-15 DIAGNOSIS — Z930 Tracheostomy status: Secondary | ICD-10-CM | POA: Diagnosis not present

## 2023-06-15 DIAGNOSIS — J690 Pneumonitis due to inhalation of food and vomit: Secondary | ICD-10-CM | POA: Diagnosis not present

## 2023-06-15 DIAGNOSIS — J449 Chronic obstructive pulmonary disease, unspecified: Secondary | ICD-10-CM | POA: Diagnosis not present

## 2023-06-16 DIAGNOSIS — D631 Anemia in chronic kidney disease: Secondary | ICD-10-CM | POA: Diagnosis not present

## 2023-06-16 DIAGNOSIS — Z930 Tracheostomy status: Secondary | ICD-10-CM | POA: Diagnosis not present

## 2023-06-16 DIAGNOSIS — N186 End stage renal disease: Secondary | ICD-10-CM | POA: Diagnosis not present

## 2023-06-16 DIAGNOSIS — J9621 Acute and chronic respiratory failure with hypoxia: Secondary | ICD-10-CM | POA: Diagnosis not present

## 2023-06-16 DIAGNOSIS — N179 Acute kidney failure, unspecified: Secondary | ICD-10-CM | POA: Diagnosis not present

## 2023-06-16 DIAGNOSIS — J9600 Acute respiratory failure, unspecified whether with hypoxia or hypercapnia: Secondary | ICD-10-CM | POA: Diagnosis not present

## 2023-06-16 DIAGNOSIS — I5030 Unspecified diastolic (congestive) heart failure: Secondary | ICD-10-CM | POA: Diagnosis not present

## 2023-06-16 DIAGNOSIS — J449 Chronic obstructive pulmonary disease, unspecified: Secondary | ICD-10-CM | POA: Diagnosis not present

## 2023-06-16 DIAGNOSIS — J690 Pneumonitis due to inhalation of food and vomit: Secondary | ICD-10-CM | POA: Diagnosis not present

## 2023-06-16 DIAGNOSIS — N2581 Secondary hyperparathyroidism of renal origin: Secondary | ICD-10-CM | POA: Diagnosis not present

## 2023-06-16 LAB — CBC WITH DIFFERENTIAL/PLATELET
Abs Immature Granulocytes: 0.1 10*3/uL — ABNORMAL HIGH (ref 0.00–0.07)
Basophils Absolute: 0 10*3/uL (ref 0.0–0.1)
Basophils Relative: 0 %
Eosinophils Absolute: 0.1 10*3/uL (ref 0.0–0.5)
Eosinophils Relative: 1 %
HCT: 22.5 % — ABNORMAL LOW (ref 36.0–46.0)
Hemoglobin: 6.7 g/dL — CL (ref 12.0–15.0)
Immature Granulocytes: 1 %
Lymphocytes Relative: 12 %
Lymphs Abs: 1.2 10*3/uL (ref 0.7–4.0)
MCH: 28.9 pg (ref 26.0–34.0)
MCHC: 29.8 g/dL — ABNORMAL LOW (ref 30.0–36.0)
MCV: 97 fL (ref 80.0–100.0)
Monocytes Absolute: 1.1 10*3/uL — ABNORMAL HIGH (ref 0.1–1.0)
Monocytes Relative: 12 %
Neutro Abs: 7.2 10*3/uL (ref 1.7–7.7)
Neutrophils Relative %: 74 %
Platelets: 303 10*3/uL (ref 150–400)
RBC: 2.32 MIL/uL — ABNORMAL LOW (ref 3.87–5.11)
RDW: 15.7 % — ABNORMAL HIGH (ref 11.5–15.5)
WBC: 9.8 10*3/uL (ref 4.0–10.5)
nRBC: 0.8 % — ABNORMAL HIGH (ref 0.0–0.2)

## 2023-06-16 LAB — RENAL FUNCTION PANEL
Albumin: 2.3 g/dL — ABNORMAL LOW (ref 3.5–5.0)
Anion gap: 7 (ref 5–15)
BUN: 31 mg/dL — ABNORMAL HIGH (ref 8–23)
CO2: 30 mmol/L (ref 22–32)
Calcium: 8.7 mg/dL — ABNORMAL LOW (ref 8.9–10.3)
Chloride: 99 mmol/L (ref 98–111)
Creatinine, Ser: 0.94 mg/dL (ref 0.44–1.00)
GFR, Estimated: >60 mL/min (ref >60)
Glucose, Bld: 144 mg/dL — ABNORMAL HIGH (ref 70–99)
Phosphorus: 1.1 mg/dL — ABNORMAL LOW (ref 2.5–4.6)
Potassium: 3.2 mmol/L — ABNORMAL LOW (ref 3.5–5.1)
Sodium: 136 mmol/L (ref 135–145)

## 2023-06-16 LAB — MAGNESIUM: Magnesium: 1.3 mg/dL — ABNORMAL LOW (ref 1.7–2.4)

## 2023-06-16 LAB — PHOSPHORUS: Phosphorus: 1.9 mg/dL — ABNORMAL LOW (ref 2.5–4.6)

## 2023-06-17 DIAGNOSIS — J9621 Acute and chronic respiratory failure with hypoxia: Secondary | ICD-10-CM | POA: Diagnosis not present

## 2023-06-17 DIAGNOSIS — J690 Pneumonitis due to inhalation of food and vomit: Secondary | ICD-10-CM | POA: Diagnosis not present

## 2023-06-17 DIAGNOSIS — J449 Chronic obstructive pulmonary disease, unspecified: Secondary | ICD-10-CM | POA: Diagnosis not present

## 2023-06-17 DIAGNOSIS — I5030 Unspecified diastolic (congestive) heart failure: Secondary | ICD-10-CM | POA: Diagnosis not present

## 2023-06-17 DIAGNOSIS — Z930 Tracheostomy status: Secondary | ICD-10-CM | POA: Diagnosis not present

## 2023-06-17 DIAGNOSIS — N186 End stage renal disease: Secondary | ICD-10-CM | POA: Diagnosis not present

## 2023-06-17 LAB — RENAL FUNCTION PANEL
Albumin: 2.3 g/dL — ABNORMAL LOW (ref 3.5–5.0)
Anion gap: 11 (ref 5–15)
BUN: 44 mg/dL — ABNORMAL HIGH (ref 8–23)
CO2: 26 mmol/L (ref 22–32)
Calcium: 9.1 mg/dL (ref 8.9–10.3)
Chloride: 97 mmol/L — ABNORMAL LOW (ref 98–111)
Creatinine, Ser: 1.27 mg/dL — ABNORMAL HIGH (ref 0.44–1.00)
GFR, Estimated: 48 mL/min — ABNORMAL LOW (ref >60)
Glucose, Bld: 123 mg/dL — ABNORMAL HIGH (ref 70–99)
Phosphorus: <1 mg/dL — CL (ref 2.5–4.6)
Potassium: 3.4 mmol/L — ABNORMAL LOW (ref 3.5–5.1)
Sodium: 134 mmol/L — ABNORMAL LOW (ref 135–145)

## 2023-06-17 LAB — HEPATITIS B SURFACE ANTIGEN: Hepatitis B Surface Ag: NONREACTIVE

## 2023-06-17 LAB — CBC WITH DIFFERENTIAL/PLATELET
Abs Immature Granulocytes: 0.11 10*3/uL — ABNORMAL HIGH (ref 0.00–0.07)
Basophils Absolute: 0 10*3/uL (ref 0.0–0.1)
Basophils Relative: 0 %
Eosinophils Absolute: 0 10*3/uL (ref 0.0–0.5)
Eosinophils Relative: 0 %
HCT: 23.1 % — ABNORMAL LOW (ref 36.0–46.0)
Hemoglobin: 6.8 g/dL — CL (ref 12.0–15.0)
Immature Granulocytes: 1 %
Lymphocytes Relative: 13 %
Lymphs Abs: 1.2 10*3/uL (ref 0.7–4.0)
MCH: 28.8 pg (ref 26.0–34.0)
MCHC: 29.4 g/dL — ABNORMAL LOW (ref 30.0–36.0)
MCV: 97.9 fL (ref 80.0–100.0)
Monocytes Absolute: 1 10*3/uL (ref 0.1–1.0)
Monocytes Relative: 10 %
Neutro Abs: 7 10*3/uL (ref 1.7–7.7)
Neutrophils Relative %: 76 %
Platelets: 299 10*3/uL (ref 150–400)
RBC: 2.36 MIL/uL — ABNORMAL LOW (ref 3.87–5.11)
RDW: 15.8 % — ABNORMAL HIGH (ref 11.5–15.5)
WBC: 9.3 10*3/uL (ref 4.0–10.5)
nRBC: 0.9 % — ABNORMAL HIGH (ref 0.0–0.2)

## 2023-06-17 LAB — HEMOGLOBIN AND HEMATOCRIT, BLOOD
HCT: 26.1 % — ABNORMAL LOW (ref 36.0–46.0)
HCT: 24.8 % — ABNORMAL LOW (ref 36.0–46.0)
Hemoglobin: 7.9 g/dL — ABNORMAL LOW (ref 12.0–15.0)
Hemoglobin: 7.7 g/dL — ABNORMAL LOW (ref 12.0–15.0)

## 2023-06-17 LAB — CBC
HCT: 19.9 % — ABNORMAL LOW (ref 36.0–46.0)
Hemoglobin: 6.2 g/dL — CL (ref 12.0–15.0)
MCH: 30.5 pg (ref 26.0–34.0)
MCHC: 31.2 g/dL (ref 30.0–36.0)
MCV: 98 fL (ref 80.0–100.0)
Platelets: 292 10*3/uL (ref 150–400)
RBC: 2.03 MIL/uL — ABNORMAL LOW (ref 3.87–5.11)
RDW: 15.8 % — ABNORMAL HIGH (ref 11.5–15.5)
WBC: 9.2 10*3/uL (ref 4.0–10.5)
nRBC: 0.9 % — ABNORMAL HIGH (ref 0.0–0.2)

## 2023-06-17 LAB — PREPARE RBC (CROSSMATCH)

## 2023-06-18 ENCOUNTER — Other Ambulatory Visit (HOSPITAL_COMMUNITY): Payer: 59

## 2023-06-18 DIAGNOSIS — N186 End stage renal disease: Secondary | ICD-10-CM

## 2023-06-18 DIAGNOSIS — I5030 Unspecified diastolic (congestive) heart failure: Secondary | ICD-10-CM

## 2023-06-18 DIAGNOSIS — J449 Chronic obstructive pulmonary disease, unspecified: Secondary | ICD-10-CM

## 2023-06-18 DIAGNOSIS — J9621 Acute and chronic respiratory failure with hypoxia: Secondary | ICD-10-CM

## 2023-06-18 DIAGNOSIS — Z930 Tracheostomy status: Secondary | ICD-10-CM

## 2023-06-18 DIAGNOSIS — J690 Pneumonitis due to inhalation of food and vomit: Secondary | ICD-10-CM

## 2023-06-18 LAB — BASIC METABOLIC PANEL
Anion gap: 7 (ref 5–15)
BUN: 35 mg/dL — ABNORMAL HIGH (ref 8–23)
CO2: 26 mmol/L (ref 22–32)
Calcium: 8.3 mg/dL — ABNORMAL LOW (ref 8.9–10.3)
Chloride: 100 mmol/L (ref 98–111)
Creatinine, Ser: 1.17 mg/dL — ABNORMAL HIGH (ref 0.44–1.00)
GFR, Estimated: 53 mL/min — ABNORMAL LOW (ref >60)
Glucose, Bld: 124 mg/dL — ABNORMAL HIGH (ref 70–99)
Potassium: 3 mmol/L — ABNORMAL LOW (ref 3.5–5.1)
Sodium: 133 mmol/L — ABNORMAL LOW (ref 135–145)

## 2023-06-18 LAB — TRIGLYCERIDES: Triglycerides: 64 mg/dL (ref <150)

## 2023-06-18 LAB — PHOSPHORUS: Phosphorus: 2.7 mg/dL (ref 2.5–4.6)

## 2023-06-18 LAB — MAGNESIUM: Magnesium: 1.4 mg/dL — ABNORMAL LOW (ref 1.7–2.4)

## 2023-06-19 DIAGNOSIS — I5030 Unspecified diastolic (congestive) heart failure: Secondary | ICD-10-CM

## 2023-06-19 DIAGNOSIS — N186 End stage renal disease: Secondary | ICD-10-CM

## 2023-06-19 DIAGNOSIS — Z930 Tracheostomy status: Secondary | ICD-10-CM

## 2023-06-19 DIAGNOSIS — J690 Pneumonitis due to inhalation of food and vomit: Secondary | ICD-10-CM

## 2023-06-19 DIAGNOSIS — J449 Chronic obstructive pulmonary disease, unspecified: Secondary | ICD-10-CM

## 2023-06-19 DIAGNOSIS — J9621 Acute and chronic respiratory failure with hypoxia: Secondary | ICD-10-CM

## 2023-06-19 LAB — RENAL FUNCTION PANEL
Albumin: 2.3 g/dL — ABNORMAL LOW (ref 3.5–5.0)
Anion gap: 6 (ref 5–15)
BUN: 51 mg/dL — ABNORMAL HIGH (ref 8–23)
CO2: 27 mmol/L (ref 22–32)
Calcium: 8.5 mg/dL — ABNORMAL LOW (ref 8.9–10.3)
Chloride: 99 mmol/L (ref 98–111)
Creatinine, Ser: 1.44 mg/dL — ABNORMAL HIGH (ref 0.44–1.00)
GFR, Estimated: 41 mL/min — ABNORMAL LOW (ref >60)
Glucose, Bld: 143 mg/dL — ABNORMAL HIGH (ref 70–99)
Phosphorus: 4.2 mg/dL (ref 2.5–4.6)
Potassium: 2.8 mmol/L — ABNORMAL LOW (ref 3.5–5.1)
Sodium: 132 mmol/L — ABNORMAL LOW (ref 135–145)

## 2023-06-19 LAB — CBC WITH DIFFERENTIAL/PLATELET
Abs Immature Granulocytes: 0.09 10*3/uL — ABNORMAL HIGH (ref 0.00–0.07)
Basophils Absolute: 0 10*3/uL (ref 0.0–0.1)
Basophils Relative: 0 %
Eosinophils Absolute: 0.1 10*3/uL (ref 0.0–0.5)
Eosinophils Relative: 1 %
HCT: 26.1 % — ABNORMAL LOW (ref 36.0–46.0)
Hemoglobin: 8 g/dL — ABNORMAL LOW (ref 12.0–15.0)
Immature Granulocytes: 1 %
Lymphocytes Relative: 20 %
Lymphs Abs: 1.5 10*3/uL (ref 0.7–4.0)
MCH: 29.7 pg (ref 26.0–34.0)
MCHC: 30.7 g/dL (ref 30.0–36.0)
MCV: 97 fL (ref 80.0–100.0)
Monocytes Absolute: 1 10*3/uL (ref 0.1–1.0)
Monocytes Relative: 13 %
Neutro Abs: 4.8 10*3/uL (ref 1.7–7.7)
Neutrophils Relative %: 65 %
Platelets: 288 10*3/uL (ref 150–400)
RBC: 2.69 MIL/uL — ABNORMAL LOW (ref 3.87–5.11)
RDW: 16.4 % — ABNORMAL HIGH (ref 11.5–15.5)
WBC: 7.4 10*3/uL (ref 4.0–10.5)
nRBC: 0.4 % — ABNORMAL HIGH (ref 0.0–0.2)

## 2023-06-19 LAB — BASIC METABOLIC PANEL WITH GFR
Anion gap: 9 (ref 5–15)
BUN: 19 mg/dL (ref 8–23)
CO2: 26 mmol/L (ref 22–32)
Calcium: 7.8 mg/dL — ABNORMAL LOW (ref 8.9–10.3)
Chloride: 101 mmol/L (ref 98–111)
Creatinine, Ser: 0.77 mg/dL (ref 0.44–1.00)
GFR, Estimated: >60 mL/min
Glucose, Bld: 134 mg/dL — ABNORMAL HIGH (ref 70–99)
Potassium: 3.5 mmol/L (ref 3.5–5.1)
Sodium: 136 mmol/L (ref 135–145)

## 2023-06-20 DIAGNOSIS — J690 Pneumonitis due to inhalation of food and vomit: Secondary | ICD-10-CM

## 2023-06-20 DIAGNOSIS — N186 End stage renal disease: Secondary | ICD-10-CM

## 2023-06-20 DIAGNOSIS — Z930 Tracheostomy status: Secondary | ICD-10-CM

## 2023-06-20 DIAGNOSIS — I5030 Unspecified diastolic (congestive) heart failure: Secondary | ICD-10-CM

## 2023-06-20 DIAGNOSIS — J449 Chronic obstructive pulmonary disease, unspecified: Secondary | ICD-10-CM

## 2023-06-20 DIAGNOSIS — J9621 Acute and chronic respiratory failure with hypoxia: Secondary | ICD-10-CM

## 2023-06-20 LAB — BASIC METABOLIC PANEL
Anion gap: 6 (ref 5–15)
BUN: 31 mg/dL — ABNORMAL HIGH (ref 8–23)
CO2: 28 mmol/L (ref 22–32)
Calcium: 8.3 mg/dL — ABNORMAL LOW (ref 8.9–10.3)
Chloride: 99 mmol/L (ref 98–111)
Creatinine, Ser: 1.06 mg/dL — ABNORMAL HIGH (ref 0.44–1.00)
GFR, Estimated: 59 mL/min — ABNORMAL LOW (ref >60)
Glucose, Bld: 149 mg/dL — ABNORMAL HIGH (ref 70–99)
Potassium: 3.7 mmol/L (ref 3.5–5.1)
Sodium: 133 mmol/L — ABNORMAL LOW (ref 135–145)

## 2023-06-20 LAB — PHOSPHORUS: Phosphorus: 1.3 mg/dL — ABNORMAL LOW (ref 2.5–4.6)

## 2023-06-20 LAB — MAGNESIUM: Magnesium: 1.6 mg/dL — ABNORMAL LOW (ref 1.7–2.4)

## 2023-06-21 DIAGNOSIS — N186 End stage renal disease: Secondary | ICD-10-CM

## 2023-06-21 DIAGNOSIS — J690 Pneumonitis due to inhalation of food and vomit: Secondary | ICD-10-CM

## 2023-06-21 DIAGNOSIS — J9621 Acute and chronic respiratory failure with hypoxia: Secondary | ICD-10-CM

## 2023-06-21 DIAGNOSIS — J449 Chronic obstructive pulmonary disease, unspecified: Secondary | ICD-10-CM

## 2023-06-21 DIAGNOSIS — I5030 Unspecified diastolic (congestive) heart failure: Secondary | ICD-10-CM

## 2023-06-21 DIAGNOSIS — Z930 Tracheostomy status: Secondary | ICD-10-CM

## 2023-06-21 LAB — BPAM RBC
Blood Product Expiration Date: 202410202359
Blood Product Expiration Date: 202410212359
ISSUE DATE / TIME: 202410010457
Unit Type and Rh: 6200
Unit Type and Rh: 6200

## 2023-06-21 LAB — CBC WITH DIFFERENTIAL/PLATELET
Abs Immature Granulocytes: 0.08 10*3/uL — ABNORMAL HIGH (ref 0.00–0.07)
Basophils Absolute: 0 10*3/uL (ref 0.0–0.1)
Basophils Relative: 0 %
Eosinophils Absolute: 0.1 10*3/uL (ref 0.0–0.5)
Eosinophils Relative: 2 %
HCT: 26 % — ABNORMAL LOW (ref 36.0–46.0)
Hemoglobin: 7.8 g/dL — ABNORMAL LOW (ref 12.0–15.0)
Immature Granulocytes: 1 %
Lymphocytes Relative: 19 %
Lymphs Abs: 1.4 10*3/uL (ref 0.7–4.0)
MCH: 29.7 pg (ref 26.0–34.0)
MCHC: 30 g/dL (ref 30.0–36.0)
MCV: 98.9 fL (ref 80.0–100.0)
Monocytes Absolute: 0.8 10*3/uL (ref 0.1–1.0)
Monocytes Relative: 11 %
Neutro Abs: 4.8 10*3/uL (ref 1.7–7.7)
Neutrophils Relative %: 67 %
Platelets: 252 10*3/uL (ref 150–400)
RBC: 2.63 MIL/uL — ABNORMAL LOW (ref 3.87–5.11)
RDW: 15.9 % — ABNORMAL HIGH (ref 11.5–15.5)
WBC: 7.1 10*3/uL (ref 4.0–10.5)
nRBC: 0.4 % — ABNORMAL HIGH (ref 0.0–0.2)

## 2023-06-21 LAB — TYPE AND SCREEN
ABO/RH(D): A POS
Antibody Screen: NEGATIVE
Unit division: 0
Unit division: 0

## 2023-06-21 LAB — MAGNESIUM: Magnesium: 2.1 mg/dL (ref 1.7–2.4)

## 2023-06-21 LAB — RENAL FUNCTION PANEL
Albumin: 2.3 g/dL — ABNORMAL LOW (ref 3.5–5.0)
Anion gap: 8 (ref 5–15)
BUN: 43 mg/dL — ABNORMAL HIGH (ref 8–23)
CO2: 29 mmol/L (ref 22–32)
Calcium: 8.6 mg/dL — ABNORMAL LOW (ref 8.9–10.3)
Chloride: 97 mmol/L — ABNORMAL LOW (ref 98–111)
Creatinine, Ser: 1.18 mg/dL — ABNORMAL HIGH (ref 0.44–1.00)
GFR, Estimated: 52 mL/min — ABNORMAL LOW (ref >60)
Glucose, Bld: 131 mg/dL — ABNORMAL HIGH (ref 70–99)
Phosphorus: 5 mg/dL — ABNORMAL HIGH (ref 2.5–4.6)
Potassium: 3.4 mmol/L — ABNORMAL LOW (ref 3.5–5.1)
Sodium: 134 mmol/L — ABNORMAL LOW (ref 135–145)

## 2023-06-21 LAB — TRIGLYCERIDES: Triglycerides: 73 mg/dL (ref <150)

## 2023-06-22 DIAGNOSIS — Z930 Tracheostomy status: Secondary | ICD-10-CM

## 2023-06-22 DIAGNOSIS — J690 Pneumonitis due to inhalation of food and vomit: Secondary | ICD-10-CM

## 2023-06-22 DIAGNOSIS — J449 Chronic obstructive pulmonary disease, unspecified: Secondary | ICD-10-CM

## 2023-06-22 DIAGNOSIS — I5030 Unspecified diastolic (congestive) heart failure: Secondary | ICD-10-CM

## 2023-06-22 DIAGNOSIS — J9621 Acute and chronic respiratory failure with hypoxia: Secondary | ICD-10-CM

## 2023-06-22 DIAGNOSIS — N186 End stage renal disease: Secondary | ICD-10-CM

## 2023-06-23 DIAGNOSIS — J690 Pneumonitis due to inhalation of food and vomit: Secondary | ICD-10-CM

## 2023-06-23 DIAGNOSIS — J9621 Acute and chronic respiratory failure with hypoxia: Secondary | ICD-10-CM

## 2023-06-23 DIAGNOSIS — N186 End stage renal disease: Secondary | ICD-10-CM

## 2023-06-23 DIAGNOSIS — Z930 Tracheostomy status: Secondary | ICD-10-CM

## 2023-06-23 DIAGNOSIS — J449 Chronic obstructive pulmonary disease, unspecified: Secondary | ICD-10-CM

## 2023-06-23 DIAGNOSIS — I5030 Unspecified diastolic (congestive) heart failure: Secondary | ICD-10-CM

## 2023-06-23 LAB — CULTURE, RESPIRATORY W GRAM STAIN

## 2023-06-23 LAB — MAGNESIUM: Magnesium: 1.7 mg/dL (ref 1.7–2.4)

## 2023-06-23 LAB — PHOSPHORUS: Phosphorus: 3.3 mg/dL (ref 2.5–4.6)

## 2023-06-24 DIAGNOSIS — Z930 Tracheostomy status: Secondary | ICD-10-CM

## 2023-06-24 DIAGNOSIS — J9621 Acute and chronic respiratory failure with hypoxia: Secondary | ICD-10-CM

## 2023-06-24 DIAGNOSIS — I5030 Unspecified diastolic (congestive) heart failure: Secondary | ICD-10-CM

## 2023-06-24 DIAGNOSIS — J449 Chronic obstructive pulmonary disease, unspecified: Secondary | ICD-10-CM

## 2023-06-24 DIAGNOSIS — N186 End stage renal disease: Secondary | ICD-10-CM

## 2023-06-24 DIAGNOSIS — J690 Pneumonitis due to inhalation of food and vomit: Secondary | ICD-10-CM

## 2023-06-24 LAB — RENAL FUNCTION PANEL
Albumin: 2.2 g/dL — ABNORMAL LOW (ref 3.5–5.0)
Anion gap: 11 (ref 5–15)
BUN: 55 mg/dL — ABNORMAL HIGH (ref 8–23)
CO2: 25 mmol/L (ref 22–32)
Calcium: 9.2 mg/dL (ref 8.9–10.3)
Chloride: 97 mmol/L — ABNORMAL LOW (ref 98–111)
Creatinine, Ser: 1.26 mg/dL — ABNORMAL HIGH (ref 0.44–1.00)
GFR, Estimated: 48 mL/min — ABNORMAL LOW
Glucose, Bld: 115 mg/dL — ABNORMAL HIGH (ref 70–99)
Phosphorus: 3.5 mg/dL (ref 2.5–4.6)
Potassium: 4.2 mmol/L (ref 3.5–5.1)
Sodium: 133 mmol/L — ABNORMAL LOW (ref 135–145)

## 2023-06-24 LAB — CBC WITH DIFFERENTIAL/PLATELET
Abs Immature Granulocytes: 0.17 K/uL — ABNORMAL HIGH (ref 0.00–0.07)
Basophils Absolute: 0 K/uL (ref 0.0–0.1)
Basophils Relative: 0 %
Eosinophils Absolute: 0.1 K/uL (ref 0.0–0.5)
Eosinophils Relative: 1 %
HCT: 24.6 % — ABNORMAL LOW (ref 36.0–46.0)
Hemoglobin: 7.5 g/dL — ABNORMAL LOW (ref 12.0–15.0)
Immature Granulocytes: 2 %
Lymphocytes Relative: 17 %
Lymphs Abs: 1.9 K/uL (ref 0.7–4.0)
MCH: 30.2 pg (ref 26.0–34.0)
MCHC: 30.5 g/dL (ref 30.0–36.0)
MCV: 99.2 fL (ref 80.0–100.0)
Monocytes Absolute: 1.4 K/uL — ABNORMAL HIGH (ref 0.1–1.0)
Monocytes Relative: 12 %
Neutro Abs: 7.6 K/uL (ref 1.7–7.7)
Neutrophils Relative %: 68 %
Platelets: 211 K/uL (ref 150–400)
RBC: 2.48 MIL/uL — ABNORMAL LOW (ref 3.87–5.11)
RDW: 16 % — ABNORMAL HIGH (ref 11.5–15.5)
WBC: 11.3 K/uL — ABNORMAL HIGH (ref 4.0–10.5)
nRBC: 0.4 % — ABNORMAL HIGH (ref 0.0–0.2)

## 2023-06-25 DIAGNOSIS — N186 End stage renal disease: Secondary | ICD-10-CM

## 2023-06-25 DIAGNOSIS — J449 Chronic obstructive pulmonary disease, unspecified: Secondary | ICD-10-CM

## 2023-06-25 DIAGNOSIS — I5030 Unspecified diastolic (congestive) heart failure: Secondary | ICD-10-CM

## 2023-06-25 DIAGNOSIS — J690 Pneumonitis due to inhalation of food and vomit: Secondary | ICD-10-CM

## 2023-06-25 DIAGNOSIS — Z930 Tracheostomy status: Secondary | ICD-10-CM

## 2023-06-25 DIAGNOSIS — J9621 Acute and chronic respiratory failure with hypoxia: Secondary | ICD-10-CM

## 2023-06-25 LAB — BASIC METABOLIC PANEL
Anion gap: 5 (ref 5–15)
BUN: 31 mg/dL — ABNORMAL HIGH (ref 8–23)
CO2: 29 mmol/L (ref 22–32)
Calcium: 9 mg/dL (ref 8.9–10.3)
Chloride: 101 mmol/L (ref 98–111)
Creatinine, Ser: 1.04 mg/dL — ABNORMAL HIGH (ref 0.44–1.00)
GFR, Estimated: >60 mL/min (ref >60)
Glucose, Bld: 128 mg/dL — ABNORMAL HIGH (ref 70–99)
Potassium: 4.3 mmol/L (ref 3.5–5.1)
Sodium: 135 mmol/L (ref 135–145)

## 2023-06-25 LAB — TRIGLYCERIDES: Triglycerides: 54 mg/dL (ref <150)

## 2023-06-25 LAB — PHOSPHORUS: Phosphorus: 2.6 mg/dL (ref 2.5–4.6)

## 2023-06-25 LAB — MAGNESIUM: Magnesium: 1.7 mg/dL (ref 1.7–2.4)

## 2023-06-26 DIAGNOSIS — J449 Chronic obstructive pulmonary disease, unspecified: Secondary | ICD-10-CM

## 2023-06-26 DIAGNOSIS — J690 Pneumonitis due to inhalation of food and vomit: Secondary | ICD-10-CM

## 2023-06-26 DIAGNOSIS — Z930 Tracheostomy status: Secondary | ICD-10-CM

## 2023-06-26 DIAGNOSIS — N186 End stage renal disease: Secondary | ICD-10-CM

## 2023-06-26 DIAGNOSIS — I5030 Unspecified diastolic (congestive) heart failure: Secondary | ICD-10-CM

## 2023-06-26 DIAGNOSIS — J9621 Acute and chronic respiratory failure with hypoxia: Secondary | ICD-10-CM

## 2023-06-26 LAB — CBC WITH DIFFERENTIAL/PLATELET
Abs Immature Granulocytes: 0.06 10*3/uL (ref 0.00–0.07)
Basophils Absolute: 0 10*3/uL (ref 0.0–0.1)
Basophils Relative: 0 %
Eosinophils Absolute: 0 10*3/uL (ref 0.0–0.5)
Eosinophils Relative: 1 %
HCT: 27.2 % — ABNORMAL LOW (ref 36.0–46.0)
Hemoglobin: 8 g/dL — ABNORMAL LOW (ref 12.0–15.0)
Immature Granulocytes: 1 %
Lymphocytes Relative: 19 %
Lymphs Abs: 1.4 10*3/uL (ref 0.7–4.0)
MCH: 29.5 pg (ref 26.0–34.0)
MCHC: 29.4 g/dL — ABNORMAL LOW (ref 30.0–36.0)
MCV: 100.4 fL — ABNORMAL HIGH (ref 80.0–100.0)
Monocytes Absolute: 0.5 10*3/uL (ref 0.1–1.0)
Monocytes Relative: 7 %
Neutro Abs: 5.1 10*3/uL (ref 1.7–7.7)
Neutrophils Relative %: 72 %
Platelets: 183 10*3/uL (ref 150–400)
RBC: 2.71 MIL/uL — ABNORMAL LOW (ref 3.87–5.11)
RDW: 16.3 % — ABNORMAL HIGH (ref 11.5–15.5)
WBC: 7.1 10*3/uL (ref 4.0–10.5)
nRBC: 0 % (ref 0.0–0.2)

## 2023-06-26 LAB — URINE CULTURE

## 2023-06-26 LAB — RENAL FUNCTION PANEL
Albumin: 2.6 g/dL — ABNORMAL LOW (ref 3.5–5.0)
Anion gap: 6 (ref 5–15)
BUN: 43 mg/dL — ABNORMAL HIGH (ref 8–23)
CO2: 32 mmol/L (ref 22–32)
Calcium: 9.9 mg/dL (ref 8.9–10.3)
Chloride: 97 mmol/L — ABNORMAL LOW (ref 98–111)
Creatinine, Ser: 1.31 mg/dL — ABNORMAL HIGH (ref 0.44–1.00)
GFR, Estimated: 46 mL/min — ABNORMAL LOW (ref >60)
Glucose, Bld: 131 mg/dL — ABNORMAL HIGH (ref 70–99)
Phosphorus: 3.2 mg/dL (ref 2.5–4.6)
Potassium: 4.8 mmol/L (ref 3.5–5.1)
Sodium: 135 mmol/L (ref 135–145)

## 2023-06-26 LAB — HEPATITIS B SURFACE ANTIGEN: Hepatitis B Surface Ag: NONREACTIVE

## 2023-06-27 ENCOUNTER — Other Ambulatory Visit (HOSPITAL_COMMUNITY): Payer: 59

## 2023-06-27 DIAGNOSIS — Z930 Tracheostomy status: Secondary | ICD-10-CM

## 2023-06-27 DIAGNOSIS — J690 Pneumonitis due to inhalation of food and vomit: Secondary | ICD-10-CM

## 2023-06-27 DIAGNOSIS — I5030 Unspecified diastolic (congestive) heart failure: Secondary | ICD-10-CM

## 2023-06-27 DIAGNOSIS — J449 Chronic obstructive pulmonary disease, unspecified: Secondary | ICD-10-CM

## 2023-06-27 DIAGNOSIS — J9621 Acute and chronic respiratory failure with hypoxia: Secondary | ICD-10-CM

## 2023-06-27 DIAGNOSIS — N186 End stage renal disease: Secondary | ICD-10-CM

## 2023-06-27 LAB — BASIC METABOLIC PANEL
Anion gap: 8 (ref 5–15)
BUN: 39 mg/dL — ABNORMAL HIGH (ref 8–23)
CO2: 29 mmol/L (ref 22–32)
Calcium: 9.2 mg/dL (ref 8.9–10.3)
Chloride: 97 mmol/L — ABNORMAL LOW (ref 98–111)
Creatinine, Ser: 1.12 mg/dL — ABNORMAL HIGH (ref 0.44–1.00)
GFR, Estimated: 56 mL/min — ABNORMAL LOW (ref >60)
Glucose, Bld: 138 mg/dL — ABNORMAL HIGH (ref 70–99)
Potassium: 4 mmol/L (ref 3.5–5.1)
Sodium: 134 mmol/L — ABNORMAL LOW (ref 135–145)

## 2023-06-27 LAB — PHOSPHORUS: Phosphorus: 3.1 mg/dL (ref 2.5–4.6)

## 2023-06-27 LAB — MAGNESIUM: Magnesium: 1.7 mg/dL (ref 1.7–2.4)

## 2023-06-28 ENCOUNTER — Other Ambulatory Visit (HOSPITAL_COMMUNITY): Payer: 59

## 2023-06-28 DIAGNOSIS — J9621 Acute and chronic respiratory failure with hypoxia: Secondary | ICD-10-CM

## 2023-06-28 DIAGNOSIS — J690 Pneumonitis due to inhalation of food and vomit: Secondary | ICD-10-CM

## 2023-06-28 DIAGNOSIS — I5030 Unspecified diastolic (congestive) heart failure: Secondary | ICD-10-CM

## 2023-06-28 DIAGNOSIS — J449 Chronic obstructive pulmonary disease, unspecified: Secondary | ICD-10-CM

## 2023-06-28 DIAGNOSIS — Z930 Tracheostomy status: Secondary | ICD-10-CM

## 2023-06-28 DIAGNOSIS — N186 End stage renal disease: Secondary | ICD-10-CM

## 2023-06-28 LAB — BLOOD GAS, ARTERIAL
Acid-Base Excess: 0.5 mmol/L (ref 0.0–2.0)
Acid-Base Excess: 2.4 mmol/L — ABNORMAL HIGH (ref 0.0–2.0)
Bicarbonate: 30.3 mmol/L — ABNORMAL HIGH (ref 20.0–28.0)
Bicarbonate: 31 mmol/L — ABNORMAL HIGH (ref 20.0–28.0)
O2 Saturation: 100 %
O2 Saturation: 100 %
Patient temperature: 37
Patient temperature: 36.2
pCO2 arterial: 74 mm[Hg] (ref 32–48)
pCO2 arterial: 64 mm[Hg] — ABNORMAL HIGH (ref 32–48)
pH, Arterial: 7.22 — ABNORMAL LOW (ref 7.35–7.45)
pH, Arterial: 7.29 — ABNORMAL LOW (ref 7.35–7.45)
pO2, Arterial: 169 mm[Hg] — ABNORMAL HIGH (ref 83–108)
pO2, Arterial: 165 mm[Hg] — ABNORMAL HIGH (ref 83–108)

## 2023-06-28 LAB — CBC WITH DIFFERENTIAL/PLATELET
Abs Immature Granulocytes: 0.03 10*3/uL (ref 0.00–0.07)
Basophils Absolute: 0 10*3/uL (ref 0.0–0.1)
Basophils Relative: 0 %
Eosinophils Absolute: 0.1 10*3/uL (ref 0.0–0.5)
Eosinophils Relative: 1 %
HCT: 24.9 % — ABNORMAL LOW (ref 36.0–46.0)
Hemoglobin: 7.4 g/dL — ABNORMAL LOW (ref 12.0–15.0)
Immature Granulocytes: 0 %
Lymphocytes Relative: 18 %
Lymphs Abs: 1.2 10*3/uL (ref 0.7–4.0)
MCH: 29.4 pg (ref 26.0–34.0)
MCHC: 29.7 g/dL — ABNORMAL LOW (ref 30.0–36.0)
MCV: 98.8 fL (ref 80.0–100.0)
Monocytes Absolute: 0.5 10*3/uL (ref 0.1–1.0)
Monocytes Relative: 7 %
Neutro Abs: 5.1 10*3/uL (ref 1.7–7.7)
Neutrophils Relative %: 74 %
Platelets: 123 10*3/uL — ABNORMAL LOW (ref 150–400)
RBC: 2.52 MIL/uL — ABNORMAL LOW (ref 3.87–5.11)
RDW: 16 % — ABNORMAL HIGH (ref 11.5–15.5)
WBC: 6.8 10*3/uL (ref 4.0–10.5)
nRBC: 0.3 % — ABNORMAL HIGH (ref 0.0–0.2)

## 2023-06-28 LAB — RENAL FUNCTION PANEL
Albumin: 2.5 g/dL — ABNORMAL LOW (ref 3.5–5.0)
Anion gap: 7 (ref 5–15)
BUN: 59 mg/dL — ABNORMAL HIGH (ref 8–23)
CO2: 29 mmol/L (ref 22–32)
Calcium: 9.5 mg/dL (ref 8.9–10.3)
Chloride: 98 mmol/L (ref 98–111)
Creatinine, Ser: 1.65 mg/dL — ABNORMAL HIGH (ref 0.44–1.00)
GFR, Estimated: 35 mL/min — ABNORMAL LOW (ref >60)
Glucose, Bld: 134 mg/dL — ABNORMAL HIGH (ref 70–99)
Phosphorus: 3.8 mg/dL (ref 2.5–4.6)
Potassium: 4.2 mmol/L (ref 3.5–5.1)
Sodium: 134 mmol/L — ABNORMAL LOW (ref 135–145)

## 2023-06-29 DIAGNOSIS — N186 End stage renal disease: Secondary | ICD-10-CM

## 2023-06-29 DIAGNOSIS — J690 Pneumonitis due to inhalation of food and vomit: Secondary | ICD-10-CM

## 2023-06-29 DIAGNOSIS — J9621 Acute and chronic respiratory failure with hypoxia: Secondary | ICD-10-CM

## 2023-06-29 DIAGNOSIS — Z930 Tracheostomy status: Secondary | ICD-10-CM

## 2023-06-29 DIAGNOSIS — I5030 Unspecified diastolic (congestive) heart failure: Secondary | ICD-10-CM

## 2023-06-29 DIAGNOSIS — J449 Chronic obstructive pulmonary disease, unspecified: Secondary | ICD-10-CM

## 2023-06-29 LAB — TRIGLYCERIDES: Triglycerides: 50 mg/dL (ref <150)

## 2023-06-30 ENCOUNTER — Other Ambulatory Visit (HOSPITAL_COMMUNITY): Payer: 59

## 2023-06-30 DIAGNOSIS — I5030 Unspecified diastolic (congestive) heart failure: Secondary | ICD-10-CM

## 2023-06-30 DIAGNOSIS — Z930 Tracheostomy status: Secondary | ICD-10-CM

## 2023-06-30 DIAGNOSIS — N186 End stage renal disease: Secondary | ICD-10-CM

## 2023-06-30 DIAGNOSIS — J690 Pneumonitis due to inhalation of food and vomit: Secondary | ICD-10-CM

## 2023-06-30 DIAGNOSIS — J449 Chronic obstructive pulmonary disease, unspecified: Secondary | ICD-10-CM

## 2023-06-30 DIAGNOSIS — J9621 Acute and chronic respiratory failure with hypoxia: Secondary | ICD-10-CM

## 2023-06-30 LAB — COMPREHENSIVE METABOLIC PANEL
ALT: 24 U/L (ref 0–44)
AST: 13 U/L — ABNORMAL LOW (ref 15–41)
Albumin: 2.5 g/dL — ABNORMAL LOW (ref 3.5–5.0)
Alkaline Phosphatase: 109 U/L (ref 38–126)
Anion gap: 8 (ref 5–15)
BUN: 76 mg/dL — ABNORMAL HIGH (ref 8–23)
CO2: 25 mmol/L (ref 22–32)
Calcium: 8.9 mg/dL (ref 8.9–10.3)
Chloride: 99 mmol/L (ref 98–111)
Creatinine, Ser: 2.29 mg/dL — ABNORMAL HIGH (ref 0.44–1.00)
GFR, Estimated: 24 mL/min — ABNORMAL LOW (ref >60)
Glucose, Bld: 158 mg/dL — ABNORMAL HIGH (ref 70–99)
Potassium: 4.8 mmol/L (ref 3.5–5.1)
Sodium: 132 mmol/L — ABNORMAL LOW (ref 135–145)
Total Bilirubin: 1.5 mg/dL — ABNORMAL HIGH (ref 0.3–1.2)
Total Protein: 5.8 g/dL — ABNORMAL LOW (ref 6.5–8.1)

## 2023-06-30 LAB — PROTIME-INR
INR: 1.4 — ABNORMAL HIGH (ref 0.8–1.2)
Prothrombin Time: 17 s — ABNORMAL HIGH (ref 11.4–15.2)

## 2023-06-30 LAB — CBC WITH DIFFERENTIAL/PLATELET
Abs Immature Granulocytes: 0.06 10*3/uL (ref 0.00–0.07)
Basophils Absolute: 0 10*3/uL (ref 0.0–0.1)
Basophils Relative: 0 %
Eosinophils Absolute: 0 10*3/uL (ref 0.0–0.5)
Eosinophils Relative: 0 %
HCT: 22.6 % — ABNORMAL LOW (ref 36.0–46.0)
Hemoglobin: 6.4 g/dL — CL (ref 12.0–15.0)
Immature Granulocytes: 1 %
Lymphocytes Relative: 13 %
Lymphs Abs: 0.9 10*3/uL (ref 0.7–4.0)
MCH: 29.2 pg (ref 26.0–34.0)
MCHC: 28.3 g/dL — ABNORMAL LOW (ref 30.0–36.0)
MCV: 103.2 fL — ABNORMAL HIGH (ref 80.0–100.0)
Monocytes Absolute: 0.3 10*3/uL (ref 0.1–1.0)
Monocytes Relative: 5 %
Neutro Abs: 5.5 10*3/uL (ref 1.7–7.7)
Neutrophils Relative %: 81 %
Platelets: 83 10*3/uL — ABNORMAL LOW (ref 150–400)
RBC: 2.19 MIL/uL — ABNORMAL LOW (ref 3.87–5.11)
RDW: 16.3 % — ABNORMAL HIGH (ref 11.5–15.5)
WBC: 6.8 10*3/uL (ref 4.0–10.5)
nRBC: 1 % — ABNORMAL HIGH (ref 0.0–0.2)

## 2023-06-30 LAB — D-DIMER, QUANTITATIVE: D-Dimer, Quant: 2.77 ug{FEU}/mL — ABNORMAL HIGH (ref 0.00–0.50)

## 2023-06-30 LAB — PREPARE RBC (CROSSMATCH)

## 2023-06-30 LAB — LACTIC ACID, PLASMA: Lactic Acid, Venous: 3 mmol/L (ref 0.5–1.9)

## 2023-06-30 LAB — URINALYSIS, ROUTINE W REFLEX MICROSCOPIC
Bilirubin Urine: NEGATIVE
Glucose, UA: NEGATIVE mg/dL
Ketones, ur: NEGATIVE mg/dL
Nitrite: NEGATIVE
Protein, ur: 100 mg/dL — AB
Specific Gravity, Urine: 1.016 (ref 1.005–1.030)
WBC, UA: >50 WBC/hpf (ref 0–5)
pH: 5 (ref 5.0–8.0)

## 2023-06-30 LAB — MAGNESIUM: Magnesium: 2 mg/dL (ref 1.7–2.4)

## 2023-06-30 LAB — HEMOGLOBIN AND HEMATOCRIT, BLOOD
HCT: 32.8 % — ABNORMAL LOW (ref 36.0–46.0)
Hemoglobin: 10.3 g/dL — ABNORMAL LOW (ref 12.0–15.0)

## 2023-06-30 LAB — BLOOD GAS, ARTERIAL
Acid-base deficit: 6.4 mmol/L — ABNORMAL HIGH (ref 0.0–2.0)
Acid-base deficit: 8.5 mmol/L — ABNORMAL HIGH (ref 0.0–2.0)
Bicarbonate: 25.1 mmol/L (ref 20.0–28.0)
Bicarbonate: 22.4 mmol/L (ref 20.0–28.0)
O2 Saturation: 100 %
O2 Saturation: 97 %
Patient temperature: 35.9
Patient temperature: 36.7
pCO2 arterial: 77 mm[Hg] (ref 32–48)
pCO2 arterial: 71 mm[Hg] (ref 32–48)
pH, Arterial: 7.11 — CL (ref 7.35–7.45)
pH, Arterial: 7.1 — CL (ref 7.35–7.45)
pO2, Arterial: 119 mm[Hg] — ABNORMAL HIGH (ref 83–108)
pO2, Arterial: 73 mm[Hg] — ABNORMAL LOW (ref 83–108)

## 2023-06-30 LAB — TROPONIN I (HIGH SENSITIVITY)
Troponin I (High Sensitivity): 68 ng/L — ABNORMAL HIGH (ref <18)
Troponin I (High Sensitivity): 225 ng/L (ref <18)

## 2023-06-30 LAB — PHOSPHORUS: Phosphorus: 4.9 mg/dL — ABNORMAL HIGH (ref 2.5–4.6)

## 2023-06-30 LAB — APTT: aPTT: 31 s (ref 24–36)

## 2023-07-01 ENCOUNTER — Other Ambulatory Visit (HOSPITAL_COMMUNITY): Payer: 59

## 2023-07-01 DIAGNOSIS — Z930 Tracheostomy status: Secondary | ICD-10-CM

## 2023-07-01 DIAGNOSIS — I5030 Unspecified diastolic (congestive) heart failure: Secondary | ICD-10-CM

## 2023-07-01 DIAGNOSIS — J690 Pneumonitis due to inhalation of food and vomit: Secondary | ICD-10-CM

## 2023-07-01 DIAGNOSIS — J449 Chronic obstructive pulmonary disease, unspecified: Secondary | ICD-10-CM

## 2023-07-01 DIAGNOSIS — J9621 Acute and chronic respiratory failure with hypoxia: Secondary | ICD-10-CM

## 2023-07-01 DIAGNOSIS — N186 End stage renal disease: Secondary | ICD-10-CM

## 2023-07-01 LAB — CBC WITH DIFFERENTIAL/PLATELET
Abs Immature Granulocytes: 0.07 10*3/uL (ref 0.00–0.07)
Basophils Absolute: 0 10*3/uL (ref 0.0–0.1)
Basophils Relative: 0 %
Eosinophils Absolute: 0 10*3/uL (ref 0.0–0.5)
Eosinophils Relative: 0 %
HCT: 31.2 % — ABNORMAL LOW (ref 36.0–46.0)
Hemoglobin: 9.8 g/dL — ABNORMAL LOW (ref 12.0–15.0)
Immature Granulocytes: 1 %
Lymphocytes Relative: 5 %
Lymphs Abs: 0.4 10*3/uL — ABNORMAL LOW (ref 0.7–4.0)
MCH: 29.2 pg (ref 26.0–34.0)
MCHC: 31.4 g/dL (ref 30.0–36.0)
MCV: 92.9 fL (ref 80.0–100.0)
Monocytes Absolute: 0.2 10*3/uL (ref 0.1–1.0)
Monocytes Relative: 2 %
Neutro Abs: 7.2 10*3/uL (ref 1.7–7.7)
Neutrophils Relative %: 92 %
Platelets: 84 10*3/uL — ABNORMAL LOW (ref 150–400)
RBC: 3.36 MIL/uL — ABNORMAL LOW (ref 3.87–5.11)
RDW: 15.4 % (ref 11.5–15.5)
WBC: 7.8 10*3/uL (ref 4.0–10.5)
nRBC: 0.9 % — ABNORMAL HIGH (ref 0.0–0.2)

## 2023-07-01 LAB — BLOOD GAS, ARTERIAL
Acid-Base Excess: 0.6 mmol/L (ref 0.0–2.0)
Acid-base deficit: 8.3 mmol/L — ABNORMAL HIGH (ref 0.0–2.0)
Bicarbonate: 20.2 mmol/L (ref 20.0–28.0)
Bicarbonate: 27.4 mmol/L (ref 20.0–28.0)
O2 Saturation: 99.7 %
O2 Saturation: 99.9 %
Patient temperature: 37
Patient temperature: 37
pCO2 arterial: 53 mm[Hg] — ABNORMAL HIGH (ref 32–48)
pCO2 arterial: 52 mm[Hg] — ABNORMAL HIGH (ref 32–48)
pH, Arterial: 7.19 — CL (ref 7.35–7.45)
pH, Arterial: 7.33 — ABNORMAL LOW (ref 7.35–7.45)
pO2, Arterial: 114 mm[Hg] — ABNORMAL HIGH (ref 83–108)
pO2, Arterial: 158 mm[Hg] — ABNORMAL HIGH (ref 83–108)

## 2023-07-01 LAB — TYPE AND SCREEN
ABO/RH(D): A POS
Antibody Screen: NEGATIVE
Unit division: 0
Unit division: 0

## 2023-07-01 LAB — BPAM RBC
Blood Product Expiration Date: 202411092359
Blood Product Expiration Date: 202411092359
ISSUE DATE / TIME: 202410141257
ISSUE DATE / TIME: 202410141628
ISSUING PHYSICIAN: 202410141257
Unit Type and Rh: 202411092359
Unit Type and Rh: 6200
Unit Type and Rh: 6200

## 2023-07-01 LAB — RENAL FUNCTION PANEL
Albumin: 2.7 g/dL — ABNORMAL LOW (ref 3.5–5.0)
Anion gap: 15 (ref 5–15)
BUN: 108 mg/dL — ABNORMAL HIGH (ref 8–23)
CO2: 20 mmol/L — ABNORMAL LOW (ref 22–32)
Calcium: 9.1 mg/dL (ref 8.9–10.3)
Chloride: 94 mmol/L — ABNORMAL LOW (ref 98–111)
Creatinine, Ser: 2.87 mg/dL — ABNORMAL HIGH (ref 0.44–1.00)
GFR, Estimated: 18 mL/min — ABNORMAL LOW (ref >60)
Glucose, Bld: 151 mg/dL — ABNORMAL HIGH (ref 70–99)
Phosphorus: 4 mg/dL (ref 2.5–4.6)
Potassium: 4.4 mmol/L (ref 3.5–5.1)
Sodium: 129 mmol/L — ABNORMAL LOW (ref 135–145)

## 2023-07-01 LAB — VANCOMYCIN, TROUGH: Vancomycin Tr: 11 ug/mL — ABNORMAL LOW (ref 15–20)

## 2023-07-01 LAB — ECHOCARDIOGRAM COMPLETE
S' Lateral: 1.7 cm
Weight: 2634.94 [oz_av]

## 2023-07-01 LAB — CORTISOL: Cortisol, Plasma: >100 ug/dL

## 2023-07-01 MED ORDER — IOHEXOL 350 MG/ML SOLN
100.0000 mL | Freq: Once | INTRAVENOUS | Status: AC | PRN
  Administered 2023-07-01: 100 mL via INTRAVENOUS

## 2023-07-02 LAB — BASIC METABOLIC PANEL
Anion gap: 15 (ref 5–15)
BUN: 68 mg/dL — ABNORMAL HIGH (ref 8–23)
CO2: 20 mmol/L — ABNORMAL LOW (ref 22–32)
Calcium: 8.9 mg/dL (ref 8.9–10.3)
Chloride: 96 mmol/L — ABNORMAL LOW (ref 98–111)
Creatinine, Ser: 2.23 mg/dL — ABNORMAL HIGH (ref 0.44–1.00)
GFR, Estimated: 24 mL/min — ABNORMAL LOW (ref >60)
Glucose, Bld: 148 mg/dL — ABNORMAL HIGH (ref 70–99)
Potassium: 4.2 mmol/L (ref 3.5–5.1)
Sodium: 131 mmol/L — ABNORMAL LOW (ref 135–145)

## 2023-07-02 LAB — CULTURE, RESPIRATORY W GRAM STAIN

## 2023-07-02 LAB — MAGNESIUM: Magnesium: 1.9 mg/dL (ref 1.7–2.4)

## 2023-07-02 LAB — PHOSPHORUS: Phosphorus: 2.8 mg/dL (ref 2.5–4.6)

## 2023-07-05 LAB — CULTURE, BLOOD (SINGLE)
Culture: NO GROWTH
Special Requests: ADEQUATE

## 2023-07-18 DEATH — deceased
